# Patient Record
Sex: Female | Born: 1940 | Race: White | Hispanic: No | Marital: Single | Smoking: Former smoker
Health system: Southern US, Community
[De-identification: ages and names within clinical notes are randomized; demographics above are authoritative.]

## PROBLEM LIST (undated history)

## (undated) DIAGNOSIS — E559 Vitamin D deficiency, unspecified: Secondary | ICD-10-CM

## (undated) DIAGNOSIS — D0510 Intraductal carcinoma in situ of unspecified breast: Secondary | ICD-10-CM

## (undated) DIAGNOSIS — Z853 Personal history of malignant neoplasm of breast: Secondary | ICD-10-CM

## (undated) DIAGNOSIS — Z8543 Personal history of malignant neoplasm of ovary: Secondary | ICD-10-CM

## (undated) DIAGNOSIS — C569 Malignant neoplasm of unspecified ovary: Secondary | ICD-10-CM

## (undated) HISTORY — PX: BREAST SURGERY: SHX581

## (undated) HISTORY — PX: BIOPSY THYROID: PRO38

## (undated) HISTORY — DX: Personal history of malignant neoplasm of ovary: Z85.43

## (undated) HISTORY — DX: Vitamin D deficiency, unspecified: E55.9

## (undated) HISTORY — PX: CHOLECYSTECTOMY: SHX55

## (undated) HISTORY — PX: OTHER SURGICAL HISTORY: SHX169

## (undated) HISTORY — DX: Personal history of malignant neoplasm of breast: Z85.3

## (undated) HISTORY — DX: Intraductal carcinoma in situ of unspecified breast: D05.10

## (undated) HISTORY — DX: Malignant neoplasm of unspecified ovary: C56.9

## (undated) HISTORY — PX: COLON SURGERY: SHX602

---

## 1990-05-25 HISTORY — PX: MASTECTOMY: SHX3

## 1996-05-25 HISTORY — PX: TOTAL ABDOMINAL HYSTERECTOMY W/ BILATERAL SALPINGOOPHORECTOMY: SHX83

## 1998-01-01 ENCOUNTER — Other Ambulatory Visit: Admission: RE | Admit: 1998-01-01 | Discharge: 1998-01-01 | Payer: Self-pay | Admitting: Gynecology

## 1998-06-25 ENCOUNTER — Ambulatory Visit: Admission: RE | Admit: 1998-06-25 | Discharge: 1998-06-25 | Payer: Self-pay | Admitting: Gynecology

## 1999-03-25 ENCOUNTER — Ambulatory Visit: Admission: RE | Admit: 1999-03-25 | Discharge: 1999-03-25 | Payer: Self-pay | Admitting: Gynecology

## 1999-05-01 ENCOUNTER — Other Ambulatory Visit: Admission: RE | Admit: 1999-05-01 | Discharge: 1999-05-01 | Payer: Self-pay | Admitting: Gynecology

## 1999-08-26 ENCOUNTER — Ambulatory Visit: Admission: RE | Admit: 1999-08-26 | Discharge: 1999-08-26 | Payer: Self-pay | Admitting: Gynecology

## 2000-03-01 ENCOUNTER — Other Ambulatory Visit: Admission: RE | Admit: 2000-03-01 | Discharge: 2000-03-01 | Payer: Self-pay | Admitting: Gynecology

## 2000-07-20 ENCOUNTER — Ambulatory Visit: Admission: RE | Admit: 2000-07-20 | Discharge: 2000-07-20 | Payer: Self-pay | Admitting: Gynecology

## 2000-12-14 ENCOUNTER — Other Ambulatory Visit: Admission: RE | Admit: 2000-12-14 | Discharge: 2000-12-14 | Payer: Self-pay | Admitting: Gynecology

## 2001-05-11 ENCOUNTER — Ambulatory Visit: Admission: RE | Admit: 2001-05-11 | Discharge: 2001-05-11 | Payer: Self-pay | Admitting: Gynecology

## 2001-06-20 ENCOUNTER — Encounter (HOSPITAL_COMMUNITY): Payer: Self-pay | Admitting: Oncology

## 2001-06-20 ENCOUNTER — Ambulatory Visit (HOSPITAL_COMMUNITY): Admission: RE | Admit: 2001-06-20 | Discharge: 2001-06-20 | Payer: Self-pay | Admitting: Oncology

## 2002-01-17 ENCOUNTER — Ambulatory Visit: Admission: RE | Admit: 2002-01-17 | Discharge: 2002-01-17 | Payer: Self-pay | Admitting: Gynecology

## 2002-05-16 ENCOUNTER — Other Ambulatory Visit: Admission: RE | Admit: 2002-05-16 | Discharge: 2002-05-16 | Payer: Self-pay | Admitting: Gynecology

## 2002-05-25 HISTORY — PX: OTHER SURGICAL HISTORY: SHX169

## 2003-04-26 ENCOUNTER — Other Ambulatory Visit: Admission: RE | Admit: 2003-04-26 | Discharge: 2003-04-26 | Payer: Self-pay | Admitting: Gynecology

## 2003-04-27 ENCOUNTER — Other Ambulatory Visit: Admission: RE | Admit: 2003-04-27 | Discharge: 2003-04-27 | Payer: Self-pay | Admitting: Gynecology

## 2003-06-29 ENCOUNTER — Ambulatory Visit (HOSPITAL_COMMUNITY): Admission: RE | Admit: 2003-06-29 | Discharge: 2003-06-29 | Payer: Self-pay | Admitting: Oncology

## 2003-10-17 ENCOUNTER — Encounter: Admission: RE | Admit: 2003-10-17 | Discharge: 2003-10-17 | Payer: Self-pay | Admitting: General Surgery

## 2003-10-17 ENCOUNTER — Encounter (HOSPITAL_COMMUNITY): Admission: RE | Admit: 2003-10-17 | Discharge: 2004-01-15 | Payer: Self-pay | Admitting: General Surgery

## 2003-11-19 ENCOUNTER — Other Ambulatory Visit: Admission: RE | Admit: 2003-11-19 | Discharge: 2003-11-19 | Payer: Self-pay | Admitting: Gynecology

## 2003-12-04 ENCOUNTER — Other Ambulatory Visit: Admission: RE | Admit: 2003-12-04 | Discharge: 2003-12-04 | Payer: Self-pay | Admitting: Diagnostic Radiology

## 2004-04-11 ENCOUNTER — Ambulatory Visit: Payer: Self-pay | Admitting: Oncology

## 2004-05-08 ENCOUNTER — Other Ambulatory Visit: Admission: RE | Admit: 2004-05-08 | Discharge: 2004-05-08 | Payer: Self-pay | Admitting: Gynecology

## 2004-06-23 ENCOUNTER — Ambulatory Visit: Payer: Self-pay | Admitting: Oncology

## 2004-08-20 ENCOUNTER — Ambulatory Visit: Payer: Self-pay | Admitting: Oncology

## 2004-10-13 ENCOUNTER — Ambulatory Visit: Payer: Self-pay | Admitting: Oncology

## 2004-11-14 ENCOUNTER — Ambulatory Visit: Payer: Self-pay | Admitting: Internal Medicine

## 2004-11-20 ENCOUNTER — Ambulatory Visit: Payer: Self-pay | Admitting: Internal Medicine

## 2004-12-18 ENCOUNTER — Ambulatory Visit (HOSPITAL_BASED_OUTPATIENT_CLINIC_OR_DEPARTMENT_OTHER): Admission: RE | Admit: 2004-12-18 | Discharge: 2004-12-18 | Payer: Self-pay | Admitting: General Surgery

## 2005-04-12 ENCOUNTER — Ambulatory Visit: Payer: Self-pay | Admitting: Oncology

## 2005-08-10 ENCOUNTER — Other Ambulatory Visit: Admission: RE | Admit: 2005-08-10 | Discharge: 2005-08-10 | Payer: Self-pay | Admitting: Gynecology

## 2005-10-09 ENCOUNTER — Ambulatory Visit: Payer: Self-pay | Admitting: Oncology

## 2005-10-13 LAB — COMPREHENSIVE METABOLIC PANEL
ALT: 22 U/L (ref 0–40)
AST: 21 U/L (ref 0–37)
CO2: 27 mEq/L (ref 19–32)
Calcium: 9.5 mg/dL (ref 8.4–10.5)
Chloride: 102 mEq/L (ref 96–112)
Creatinine, Ser: 0.8 mg/dL (ref 0.4–1.2)
Potassium: 3.9 mEq/L (ref 3.5–5.3)
Sodium: 139 mEq/L (ref 135–145)
Total Protein: 6.9 g/dL (ref 6.0–8.3)

## 2005-10-13 LAB — CBC WITH DIFFERENTIAL/PLATELET
BASO%: 0.5 % (ref 0.0–2.0)
Eosinophils Absolute: 0.3 10*3/uL (ref 0.0–0.5)
MCHC: 34.2 g/dL (ref 32.0–36.0)
MONO#: 0.4 10*3/uL (ref 0.1–0.9)
NEUT#: 4 10*3/uL (ref 1.5–6.5)
Platelets: 263 10*3/uL (ref 145–400)
RBC: 4.74 10*6/uL (ref 3.70–5.32)
RDW: 12.2 % (ref 11.3–14.5)
WBC: 5.9 10*3/uL (ref 3.9–10.0)
lymph#: 1.2 10*3/uL (ref 0.9–3.3)

## 2005-10-13 LAB — LACTATE DEHYDROGENASE: LDH: 119 U/L (ref 94–250)

## 2005-10-13 LAB — CANCER ANTIGEN 27.29: CA 27.29: 25 U/mL (ref 0–39)

## 2006-04-09 ENCOUNTER — Ambulatory Visit: Payer: Self-pay | Admitting: Oncology

## 2006-04-13 LAB — COMPREHENSIVE METABOLIC PANEL
AST: 15 U/L (ref 0–37)
Albumin: 3.9 g/dL (ref 3.5–5.2)
Alkaline Phosphatase: 97 U/L (ref 39–117)
BUN: 12 mg/dL (ref 6–23)
Calcium: 9.2 mg/dL (ref 8.4–10.5)
Chloride: 101 mEq/L (ref 96–112)
Glucose, Bld: 112 mg/dL — ABNORMAL HIGH (ref 70–99)
Potassium: 4 mEq/L (ref 3.5–5.3)
Sodium: 138 mEq/L (ref 135–145)
Total Protein: 7.1 g/dL (ref 6.0–8.3)

## 2006-04-13 LAB — LACTATE DEHYDROGENASE: LDH: 129 U/L (ref 94–250)

## 2006-04-13 LAB — CBC WITH DIFFERENTIAL/PLATELET
Basophils Absolute: 0 10*3/uL (ref 0.0–0.1)
EOS%: 2.8 % (ref 0.0–7.0)
Eosinophils Absolute: 0.2 10*3/uL (ref 0.0–0.5)
HGB: 14.1 g/dL (ref 11.6–15.9)
MCV: 92.4 fL (ref 81.0–101.0)
MONO%: 6 % (ref 0.0–13.0)
NEUT#: 6.2 10*3/uL (ref 1.5–6.5)
RBC: 4.47 10*6/uL (ref 3.70–5.32)
RDW: 12.7 % (ref 11.3–14.5)
lymph#: 1.2 10*3/uL (ref 0.9–3.3)

## 2006-04-13 LAB — CANCER ANTIGEN 27.29: CA 27.29: 23 U/mL (ref 0–39)

## 2006-06-24 ENCOUNTER — Ambulatory Visit: Payer: Self-pay | Admitting: Internal Medicine

## 2006-07-09 ENCOUNTER — Ambulatory Visit: Payer: Self-pay | Admitting: Oncology

## 2006-07-14 ENCOUNTER — Ambulatory Visit (HOSPITAL_COMMUNITY): Admission: RE | Admit: 2006-07-14 | Discharge: 2006-07-14 | Payer: Self-pay | Admitting: Oncology

## 2006-07-21 ENCOUNTER — Encounter: Admission: RE | Admit: 2006-07-21 | Discharge: 2006-07-21 | Payer: Self-pay | Admitting: Oncology

## 2006-07-30 ENCOUNTER — Encounter (INDEPENDENT_AMBULATORY_CARE_PROVIDER_SITE_OTHER): Payer: Self-pay | Admitting: Specialist

## 2006-07-30 ENCOUNTER — Inpatient Hospital Stay (HOSPITAL_COMMUNITY): Admission: RE | Admit: 2006-07-30 | Discharge: 2006-08-05 | Payer: Self-pay | Admitting: Surgery

## 2006-08-02 ENCOUNTER — Ambulatory Visit: Payer: Self-pay | Admitting: Oncology

## 2006-08-30 ENCOUNTER — Ambulatory Visit: Payer: Self-pay | Admitting: Oncology

## 2006-09-02 LAB — COMPREHENSIVE METABOLIC PANEL
ALT: 18 U/L (ref 0–35)
AST: 16 U/L (ref 0–37)
Albumin: 3.9 g/dL (ref 3.5–5.2)
Alkaline Phosphatase: 78 U/L (ref 39–117)
BUN: 18 mg/dL (ref 6–23)
CO2: 23 mEq/L (ref 19–32)
Calcium: 8.8 mg/dL (ref 8.4–10.5)
Chloride: 104 mEq/L (ref 96–112)
Creatinine, Ser: 0.9 mg/dL (ref 0.40–1.20)
Glucose, Bld: 87 mg/dL (ref 70–99)
Potassium: 3.8 mEq/L (ref 3.5–5.3)
Sodium: 137 mEq/L (ref 135–145)
Total Bilirubin: 0.4 mg/dL (ref 0.3–1.2)
Total Protein: 6.6 g/dL (ref 6.0–8.3)

## 2006-09-02 LAB — CEA: CEA: <0.5 ng/mL (ref 0.0–5.0)

## 2006-09-02 LAB — CA 125: CA 125: 9.1 U/mL (ref 0.0–30.2)

## 2006-09-02 LAB — CBC WITH DIFFERENTIAL/PLATELET
BASO%: 0.9 % (ref 0.0–2.0)
EOS%: 1.9 % (ref 0.0–7.0)
MCH: 28.9 pg (ref 26.0–34.0)
MCV: 86.4 fL (ref 81.0–101.0)
MONO%: 10.4 % (ref 0.0–13.0)
RBC: 4.52 10*6/uL (ref 3.70–5.32)
RDW: 11.6 % (ref 11.3–14.5)
lymph#: 1.3 10*3/uL (ref 0.9–3.3)

## 2006-09-09 LAB — CBC WITH DIFFERENTIAL/PLATELET
Basophils Absolute: 0 10*3/uL (ref 0.0–0.1)
EOS%: 0.3 % (ref 0.0–7.0)
HGB: 13.1 g/dL (ref 11.6–15.9)
MCH: 29.5 pg (ref 26.0–34.0)
MCV: 86.3 fL (ref 81.0–101.0)
MONO%: 1.6 % (ref 0.0–13.0)
RDW: 12 % (ref 11.3–14.5)

## 2006-09-16 LAB — CBC WITH DIFFERENTIAL/PLATELET
BASO%: 1.8 % (ref 0.0–2.0)
EOS%: 1.7 % (ref 0.0–7.0)
Eosinophils Absolute: 0.2 10*3/uL (ref 0.0–0.5)
MCH: 29.4 pg (ref 26.0–34.0)
MCHC: 34.3 g/dL (ref 32.0–36.0)
MCV: 85.8 fL (ref 81.0–101.0)
MONO%: 12.9 % (ref 0.0–13.0)
NEUT#: 7.8 10*3/uL — ABNORMAL HIGH (ref 1.5–6.5)
RBC: 4.33 10*6/uL (ref 3.70–5.32)
RDW: 11.6 % (ref 11.3–14.5)

## 2006-09-23 LAB — CBC WITH DIFFERENTIAL/PLATELET
BASO%: 0.7 % (ref 0.0–2.0)
Eosinophils Absolute: 0 10*3/uL (ref 0.0–0.5)
MONO#: 0.7 10*3/uL (ref 0.1–0.9)
NEUT#: 7.1 10*3/uL — ABNORMAL HIGH (ref 1.5–6.5)
RBC: 4.42 10*6/uL (ref 3.70–5.32)
RDW: 11.8 % (ref 11.3–14.5)
WBC: 9.6 10*3/uL (ref 3.9–10.0)
lymph#: 1.7 10*3/uL (ref 0.9–3.3)

## 2006-09-30 LAB — CBC WITH DIFFERENTIAL/PLATELET
Eosinophils Absolute: 0 10*3/uL (ref 0.0–0.5)
MCV: 85.6 fL (ref 81.0–101.0)
MONO#: 0.1 10*3/uL (ref 0.1–0.9)
MONO%: 1.7 % (ref 0.0–13.0)
NEUT#: 6.5 10*3/uL (ref 1.5–6.5)
RBC: 4.46 10*6/uL (ref 3.70–5.32)
RDW: 13.1 % (ref 11.3–14.5)
WBC: 7.3 10*3/uL (ref 3.9–10.0)
lymph#: 0.6 10*3/uL — ABNORMAL LOW (ref 0.9–3.3)

## 2006-09-30 LAB — COMPREHENSIVE METABOLIC PANEL
ALT: 39 U/L — ABNORMAL HIGH (ref 0–35)
Albumin: 4.3 g/dL (ref 3.5–5.2)
CO2: 20 mEq/L (ref 19–32)
Glucose, Bld: 139 mg/dL — ABNORMAL HIGH (ref 70–99)
Potassium: 3.8 mEq/L (ref 3.5–5.3)
Sodium: 137 mEq/L (ref 135–145)
Total Protein: 7.2 g/dL (ref 6.0–8.3)

## 2006-09-30 LAB — LACTATE DEHYDROGENASE: LDH: 154 U/L (ref 94–250)

## 2006-10-07 LAB — CBC WITH DIFFERENTIAL/PLATELET
BASO%: 0.9 % (ref 0.0–2.0)
Basophils Absolute: 0.1 10*3/uL (ref 0.0–0.1)
EOS%: 2 % (ref 0.0–7.0)
Eosinophils Absolute: 0.1 10*3/uL (ref 0.0–0.5)
HCT: 36.3 % (ref 34.8–46.6)
HGB: 12.2 g/dL (ref 11.6–15.9)
LYMPH%: 17 % (ref 14.0–48.0)
MCH: 29 pg (ref 26.0–34.0)
MCHC: 33.8 g/dL (ref 32.0–36.0)
MCV: 85.9 fL (ref 81.0–101.0)
MONO#: 0.6 10*3/uL (ref 0.1–0.9)
MONO%: 8.7 % (ref 0.0–13.0)
NEUT#: 4.8 10*3/uL (ref 1.5–6.5)
NEUT%: 71.4 % (ref 39.6–76.8)
Platelets: 153 10*3/uL (ref 145–400)
RBC: 4.23 10*6/uL (ref 3.70–5.32)
RDW: 13.3 % (ref 11.3–14.5)
WBC: 6.7 10*3/uL (ref 3.9–10.0)
lymph#: 1.1 10*3/uL (ref 0.9–3.3)

## 2006-10-14 LAB — CBC WITH DIFFERENTIAL/PLATELET
Basophils Absolute: 0 10*3/uL (ref 0.0–0.1)
Eosinophils Absolute: 0 10*3/uL (ref 0.0–0.5)
HCT: 38.2 % (ref 34.8–46.6)
LYMPH%: 21.9 % (ref 14.0–48.0)
MONO#: 0.5 10*3/uL (ref 0.1–0.9)
NEUT#: 3.6 10*3/uL (ref 1.5–6.5)
NEUT%: 66.6 % (ref 39.6–76.8)
Platelets: 174 10*3/uL (ref 145–400)
WBC: 5.4 10*3/uL (ref 3.9–10.0)

## 2006-10-15 ENCOUNTER — Ambulatory Visit: Payer: Self-pay | Admitting: Oncology

## 2006-10-20 LAB — CBC WITH DIFFERENTIAL/PLATELET
BASO%: 0.7 % (ref 0.0–2.0)
EOS%: 0.5 % (ref 0.0–7.0)
HCT: 36.5 % (ref 34.8–46.6)
LYMPH%: 18.6 % (ref 14.0–48.0)
MCH: 29.8 pg (ref 26.0–34.0)
MCHC: 34.4 g/dL (ref 32.0–36.0)
NEUT%: 70.5 % (ref 39.6–76.8)
Platelets: 176 10*3/uL (ref 145–400)

## 2006-10-21 LAB — COMPREHENSIVE METABOLIC PANEL
ALT: 24 U/L (ref 0–35)
AST: 16 U/L (ref 0–37)
BUN: 17 mg/dL (ref 6–23)
CO2: 22 mEq/L (ref 19–32)
Creatinine, Ser: 0.76 mg/dL (ref 0.40–1.20)
Total Bilirubin: 0.4 mg/dL (ref 0.3–1.2)

## 2006-10-21 LAB — LACTATE DEHYDROGENASE: LDH: 123 U/L (ref 94–250)

## 2006-10-28 LAB — CBC WITH DIFFERENTIAL/PLATELET
Basophils Absolute: 0.1 10*3/uL (ref 0.0–0.1)
Eosinophils Absolute: 0.1 10*3/uL (ref 0.0–0.5)
HGB: 12.4 g/dL (ref 11.6–15.9)
MCV: 87.4 fL (ref 81.0–101.0)
MONO#: 0.6 10*3/uL (ref 0.1–0.9)
NEUT#: 4.1 10*3/uL (ref 1.5–6.5)
RBC: 4.12 10*6/uL (ref 3.70–5.32)
RDW: 14.8 % — ABNORMAL HIGH (ref 11.3–14.5)
WBC: 5.9 10*3/uL (ref 3.9–10.0)
lymph#: 1.1 10*3/uL (ref 0.9–3.3)

## 2006-11-04 LAB — CBC WITH DIFFERENTIAL/PLATELET
Basophils Absolute: 0 10*3/uL (ref 0.0–0.1)
Eosinophils Absolute: 0 10*3/uL (ref 0.0–0.5)
HGB: 12.5 g/dL (ref 11.6–15.9)
LYMPH%: 19 % (ref 14.0–48.0)
MCV: 88.1 fL (ref 81.0–101.0)
MONO#: 0.6 10*3/uL (ref 0.1–0.9)
MONO%: 8.8 % (ref 0.0–13.0)
NEUT#: 5 10*3/uL (ref 1.5–6.5)
Platelets: 206 10*3/uL (ref 145–400)
RBC: 4.14 10*6/uL (ref 3.70–5.32)
WBC: 7 10*3/uL (ref 3.9–10.0)

## 2006-11-09 LAB — CBC WITH DIFFERENTIAL/PLATELET
Eosinophils Absolute: 0 10*3/uL (ref 0.0–0.5)
HCT: 37.5 % (ref 34.8–46.6)
LYMPH%: 27.4 % (ref 14.0–48.0)
MCHC: 34.5 g/dL (ref 32.0–36.0)
MCV: 88.2 fL (ref 81.0–101.0)
MONO#: 0.5 10*3/uL (ref 0.1–0.9)
MONO%: 12 % (ref 0.0–13.0)
NEUT#: 2.5 10*3/uL (ref 1.5–6.5)
NEUT%: 59.3 % (ref 39.6–76.8)
Platelets: 180 10*3/uL (ref 145–400)
RBC: 4.25 10*6/uL (ref 3.70–5.32)
WBC: 4.2 10*3/uL (ref 3.9–10.0)

## 2006-11-11 LAB — LACTATE DEHYDROGENASE: LDH: 129 U/L (ref 94–250)

## 2006-11-11 LAB — COMPREHENSIVE METABOLIC PANEL
Alkaline Phosphatase: 106 U/L (ref 39–117)
CO2: 22 mEq/L (ref 19–32)
Creatinine, Ser: 0.76 mg/dL (ref 0.40–1.20)
Glucose, Bld: 172 mg/dL — ABNORMAL HIGH (ref 70–99)
Total Bilirubin: 0.3 mg/dL (ref 0.3–1.2)

## 2006-11-18 LAB — CBC WITH DIFFERENTIAL/PLATELET
BASO%: 0.9 % (ref 0.0–2.0)
Basophils Absolute: 0.1 10*3/uL (ref 0.0–0.1)
Eosinophils Absolute: 0.1 10*3/uL (ref 0.0–0.5)
HCT: 36.3 % (ref 34.8–46.6)
HGB: 12.7 g/dL (ref 11.6–15.9)
LYMPH%: 16.5 % (ref 14.0–48.0)
MCHC: 35 g/dL (ref 32.0–36.0)
MONO#: 1 10*3/uL — ABNORMAL HIGH (ref 0.1–0.9)
NEUT#: 6.6 10*3/uL — ABNORMAL HIGH (ref 1.5–6.5)
NEUT%: 70.8 % (ref 39.6–76.8)
Platelets: 174 10*3/uL (ref 145–400)
WBC: 9.3 10*3/uL (ref 3.9–10.0)
lymph#: 1.5 10*3/uL (ref 0.9–3.3)

## 2006-11-25 LAB — CBC WITH DIFFERENTIAL/PLATELET
BASO%: 0.6 % (ref 0.0–2.0)
Basophils Absolute: 0 10*3/uL (ref 0.0–0.1)
EOS%: 0.6 % (ref 0.0–7.0)
HCT: 38.2 % (ref 34.8–46.6)
HGB: 13.5 g/dL (ref 11.6–15.9)
LYMPH%: 26.1 % (ref 14.0–48.0)
MCH: 31.7 pg (ref 26.0–34.0)
MCHC: 35.3 g/dL (ref 32.0–36.0)
MCV: 90 fL (ref 81.0–101.0)
NEUT%: 62.5 % (ref 39.6–76.8)
Platelets: 181 10*3/uL (ref 145–400)
lymph#: 1.3 10*3/uL (ref 0.9–3.3)

## 2006-11-29 ENCOUNTER — Ambulatory Visit: Payer: Self-pay | Admitting: Oncology

## 2006-12-01 LAB — CBC WITH DIFFERENTIAL/PLATELET
BASO%: 0.7 % (ref 0.0–2.0)
Basophils Absolute: 0 10*3/uL (ref 0.0–0.1)
EOS%: 0.6 % (ref 0.0–7.0)
HGB: 13.6 g/dL (ref 11.6–15.9)
MCH: 32.6 pg (ref 26.0–34.0)
MCHC: 35.8 g/dL (ref 32.0–36.0)
MCV: 91 fL (ref 81.0–101.0)
MONO%: 14.9 % — ABNORMAL HIGH (ref 0.0–13.0)
NEUT%: 54.3 % (ref 39.6–76.8)
RDW: 15.8 % — ABNORMAL HIGH (ref 11.3–14.5)

## 2006-12-06 LAB — COMPREHENSIVE METABOLIC PANEL
ALT: 25 U/L (ref 0–35)
AST: 16 U/L (ref 0–37)
Alkaline Phosphatase: 101 U/L (ref 39–117)
BUN: 16 mg/dL (ref 6–23)
Creatinine, Ser: 0.74 mg/dL (ref 0.40–1.20)

## 2006-12-06 LAB — VITAMIN D PNL(25-HYDRXY+1,25-DIHY)-BLD: Vit D, 1,25-Dihydroxy: 43 pg/mL (ref 6–62)

## 2006-12-09 LAB — CBC WITH DIFFERENTIAL/PLATELET
Basophils Absolute: 0.1 10*3/uL (ref 0.0–0.1)
Eosinophils Absolute: 0.1 10*3/uL (ref 0.0–0.5)
HGB: 12.8 g/dL (ref 11.6–15.9)
MCV: 93.2 fL (ref 81.0–101.0)
MONO#: 0.5 10*3/uL (ref 0.1–0.9)
MONO%: 8.3 % (ref 0.0–13.0)
NEUT#: 4.5 10*3/uL (ref 1.5–6.5)
Platelets: 114 10*3/uL — ABNORMAL LOW (ref 145–400)
RBC: 3.87 10*6/uL (ref 3.70–5.32)
RDW: 14.4 % (ref 11.3–14.5)
WBC: 6.3 10*3/uL (ref 3.9–10.0)

## 2006-12-16 LAB — CBC WITH DIFFERENTIAL/PLATELET
Basophils Absolute: 0 10*3/uL (ref 0.0–0.1)
Eosinophils Absolute: 0 10*3/uL (ref 0.0–0.5)
HCT: 37.5 % (ref 34.8–46.6)
HGB: 13 g/dL (ref 11.6–15.9)
LYMPH%: 23.3 % (ref 14.0–48.0)
MCV: 95.1 fL (ref 81.0–101.0)
MONO#: 0.5 10*3/uL (ref 0.1–0.9)
MONO%: 9.7 % (ref 0.0–13.0)
NEUT#: 3.3 10*3/uL (ref 1.5–6.5)
NEUT%: 65.6 % (ref 39.6–76.8)
Platelets: 167 10*3/uL (ref 145–400)
RBC: 3.95 10*6/uL (ref 3.70–5.32)
WBC: 5 10*3/uL (ref 3.9–10.0)

## 2006-12-23 ENCOUNTER — Encounter: Payer: Self-pay | Admitting: Internal Medicine

## 2006-12-30 LAB — CBC WITH DIFFERENTIAL/PLATELET
BASO%: 0.6 % (ref 0.0–2.0)
HCT: 35.7 % (ref 34.8–46.6)
LYMPH%: 15.9 % (ref 14.0–48.0)
MCH: 34.8 pg — ABNORMAL HIGH (ref 26.0–34.0)
MCHC: 36.4 g/dL — ABNORMAL HIGH (ref 32.0–36.0)
MCV: 95.6 fL (ref 81.0–101.0)
MONO#: 0.8 10*3/uL (ref 0.1–0.9)
MONO%: 9.5 % (ref 0.0–13.0)
NEUT%: 73.1 % (ref 39.6–76.8)
Platelets: 128 10*3/uL — ABNORMAL LOW (ref 145–400)
RBC: 3.73 10*6/uL (ref 3.70–5.32)
WBC: 8.5 10*3/uL (ref 3.9–10.0)

## 2007-01-06 LAB — CBC WITH DIFFERENTIAL/PLATELET
BASO%: 0.8 % (ref 0.0–2.0)
Basophils Absolute: 0 10*3/uL (ref 0.0–0.1)
Eosinophils Absolute: 0 10*3/uL (ref 0.0–0.5)
HCT: 36.6 % (ref 34.8–46.6)
HGB: 13.2 g/dL (ref 11.6–15.9)
LYMPH%: 27.6 % (ref 14.0–48.0)
MCHC: 36 g/dL (ref 32.0–36.0)
MONO#: 0.5 10*3/uL (ref 0.1–0.9)
NEUT%: 61.6 % (ref 39.6–76.8)
Platelets: 162 10*3/uL (ref 145–400)
WBC: 5 10*3/uL (ref 3.9–10.0)

## 2007-01-13 LAB — CBC WITH DIFFERENTIAL/PLATELET
BASO%: 0.8 % (ref 0.0–2.0)
Basophils Absolute: 0 10*3/uL (ref 0.0–0.1)
EOS%: 1.2 % (ref 0.0–7.0)
HCT: 37.5 % (ref 34.8–46.6)
HGB: 13.4 g/dL (ref 11.6–15.9)
LYMPH%: 24.9 % (ref 14.0–48.0)
MCH: 34.6 pg — ABNORMAL HIGH (ref 26.0–34.0)
MCHC: 35.6 g/dL (ref 32.0–36.0)
MCV: 97.4 fL (ref 81.0–101.0)
MONO%: 11.8 % (ref 0.0–13.0)
NEUT%: 61.3 % (ref 39.6–76.8)
Platelets: 154 10*3/uL (ref 145–400)

## 2007-01-18 ENCOUNTER — Ambulatory Visit: Payer: Self-pay | Admitting: Oncology

## 2007-01-20 ENCOUNTER — Encounter: Payer: Self-pay | Admitting: Internal Medicine

## 2007-01-20 ENCOUNTER — Encounter (INDEPENDENT_AMBULATORY_CARE_PROVIDER_SITE_OTHER): Payer: Self-pay | Admitting: *Deleted

## 2007-01-20 LAB — COMPREHENSIVE METABOLIC PANEL
ALT: 22 U/L (ref 0–35)
AST: 19 U/L (ref 0–37)
Alkaline Phosphatase: 79 U/L (ref 39–117)
BUN: 14 mg/dL (ref 6–23)
Calcium: 9.1 mg/dL (ref 8.4–10.5)
Chloride: 105 mEq/L (ref 96–112)
Creatinine, Ser: 0.75 mg/dL (ref 0.40–1.20)
Potassium: 3.9 mEq/L (ref 3.5–5.3)

## 2007-01-20 LAB — CBC WITH DIFFERENTIAL/PLATELET
BASO%: 1.3 % (ref 0.0–2.0)
Basophils Absolute: 0.1 10*3/uL (ref 0.0–0.1)
EOS%: 6 % (ref 0.0–7.0)
MCH: 34.9 pg — ABNORMAL HIGH (ref 26.0–34.0)
MCHC: 36.3 g/dL — ABNORMAL HIGH (ref 32.0–36.0)
MCV: 96.1 fL (ref 81.0–101.0)
MONO%: 9.5 % (ref 0.0–13.0)
NEUT%: 58 % (ref 39.6–76.8)
RDW: 11.7 % (ref 11.3–14.5)
lymph#: 1.1 10*3/uL (ref 0.9–3.3)

## 2007-01-25 ENCOUNTER — Ambulatory Visit (HOSPITAL_COMMUNITY): Admission: RE | Admit: 2007-01-25 | Discharge: 2007-01-25 | Payer: Self-pay | Admitting: Oncology

## 2007-03-02 ENCOUNTER — Ambulatory Visit: Payer: Self-pay | Admitting: Oncology

## 2007-03-04 LAB — COMPREHENSIVE METABOLIC PANEL
ALT: 16 U/L (ref 0–35)
CO2: 25 mEq/L (ref 19–32)
Calcium: 9.2 mg/dL (ref 8.4–10.5)
Chloride: 105 mEq/L (ref 96–112)
Sodium: 140 mEq/L (ref 135–145)
Total Protein: 6.9 g/dL (ref 6.0–8.3)

## 2007-03-04 LAB — CBC WITH DIFFERENTIAL/PLATELET
Basophils Absolute: 0 10*3/uL (ref 0.0–0.1)
EOS%: 7.2 % — ABNORMAL HIGH (ref 0.0–7.0)
Eosinophils Absolute: 0.3 10*3/uL (ref 0.0–0.5)
HGB: 14.6 g/dL (ref 11.6–15.9)
LYMPH%: 34 % (ref 14.0–48.0)
MCH: 35.1 pg — ABNORMAL HIGH (ref 26.0–34.0)
MCV: 97.1 fL (ref 81.0–101.0)
MONO%: 9.8 % (ref 0.0–13.0)
NEUT#: 2 10*3/uL (ref 1.5–6.5)
NEUT%: 48.1 % (ref 39.6–76.8)
Platelets: 172 10*3/uL (ref 145–400)

## 2007-03-04 LAB — LACTATE DEHYDROGENASE: LDH: 113 U/L (ref 94–250)

## 2007-04-15 ENCOUNTER — Ambulatory Visit: Payer: Self-pay | Admitting: Oncology

## 2007-04-19 ENCOUNTER — Encounter: Payer: Self-pay | Admitting: Internal Medicine

## 2007-04-19 LAB — COMPREHENSIVE METABOLIC PANEL
ALT: 18 U/L (ref 0–35)
CO2: 24 mEq/L (ref 19–32)
Calcium: 9.6 mg/dL (ref 8.4–10.5)
Chloride: 101 mEq/L (ref 96–112)
Potassium: 4.2 mEq/L (ref 3.5–5.3)
Sodium: 138 mEq/L (ref 135–145)
Total Protein: 6.9 g/dL (ref 6.0–8.3)

## 2007-04-19 LAB — CBC WITH DIFFERENTIAL/PLATELET
Eosinophils Absolute: 0.2 10*3/uL (ref 0.0–0.5)
LYMPH%: 28 % (ref 14.0–48.0)
MCHC: 36.4 g/dL — ABNORMAL HIGH (ref 32.0–36.0)
MCV: 92.2 fL (ref 81.0–101.0)
MONO%: 10.4 % (ref 0.0–13.0)
NEUT#: 2.5 10*3/uL (ref 1.5–6.5)
NEUT%: 56.5 % (ref 39.6–76.8)
Platelets: 174 10*3/uL (ref 145–400)
RBC: 4.4 10*6/uL (ref 3.70–5.32)

## 2007-04-19 LAB — LACTATE DEHYDROGENASE: LDH: 137 U/L (ref 94–250)

## 2007-06-16 ENCOUNTER — Ambulatory Visit: Payer: Self-pay | Admitting: Oncology

## 2007-06-20 ENCOUNTER — Encounter: Payer: Self-pay | Admitting: Internal Medicine

## 2007-06-20 LAB — CBC WITH DIFFERENTIAL/PLATELET
BASO%: 1.2 % (ref 0.0–2.0)
Eosinophils Absolute: 0.2 10*3/uL (ref 0.0–0.5)
HCT: 41.1 % (ref 34.8–46.6)
LYMPH%: 30.4 % (ref 14.0–48.0)
MCHC: 35.6 g/dL (ref 32.0–36.0)
MCV: 92.3 fL (ref 81.0–101.0)
MONO%: 10.3 % (ref 0.0–13.0)
NEUT%: 54.1 % (ref 39.6–76.8)
Platelets: 176 10*3/uL (ref 145–400)
RBC: 4.45 10*6/uL (ref 3.70–5.32)

## 2007-06-21 LAB — COMPREHENSIVE METABOLIC PANEL
Alkaline Phosphatase: 84 U/L (ref 39–117)
Creatinine, Ser: 0.86 mg/dL (ref 0.40–1.20)
Glucose, Bld: 88 mg/dL (ref 70–99)
Sodium: 139 mEq/L (ref 135–145)
Total Bilirubin: 0.4 mg/dL (ref 0.3–1.2)
Total Protein: 6.8 g/dL (ref 6.0–8.3)

## 2007-06-23 ENCOUNTER — Ambulatory Visit: Payer: Self-pay | Admitting: Internal Medicine

## 2007-06-23 DIAGNOSIS — R1084 Generalized abdominal pain: Secondary | ICD-10-CM

## 2007-06-23 DIAGNOSIS — R112 Nausea with vomiting, unspecified: Secondary | ICD-10-CM | POA: Insufficient documentation

## 2007-06-23 HISTORY — DX: Generalized abdominal pain: R10.84

## 2007-06-24 ENCOUNTER — Telehealth: Payer: Self-pay | Admitting: Internal Medicine

## 2007-06-27 ENCOUNTER — Ambulatory Visit: Payer: Self-pay | Admitting: Internal Medicine

## 2007-06-27 ENCOUNTER — Inpatient Hospital Stay (HOSPITAL_COMMUNITY): Admission: AD | Admit: 2007-06-27 | Discharge: 2007-06-29 | Payer: Self-pay | Admitting: Internal Medicine

## 2007-06-27 LAB — CONVERTED CEMR LAB: Creatinine, Ser: 1 mg/dL (ref 0.4–1.2)

## 2007-07-01 ENCOUNTER — Ambulatory Visit: Payer: Self-pay | Admitting: Internal Medicine

## 2007-08-15 ENCOUNTER — Ambulatory Visit (HOSPITAL_COMMUNITY): Admission: RE | Admit: 2007-08-15 | Discharge: 2007-08-15 | Payer: Self-pay | Admitting: Oncology

## 2007-08-16 ENCOUNTER — Ambulatory Visit: Payer: Self-pay | Admitting: Oncology

## 2007-08-18 ENCOUNTER — Encounter: Payer: Self-pay | Admitting: Internal Medicine

## 2007-10-12 ENCOUNTER — Ambulatory Visit: Payer: Self-pay | Admitting: Oncology

## 2007-10-18 ENCOUNTER — Encounter: Payer: Self-pay | Admitting: Internal Medicine

## 2007-12-15 ENCOUNTER — Ambulatory Visit: Payer: Self-pay | Admitting: Oncology

## 2007-12-20 ENCOUNTER — Encounter: Payer: Self-pay | Admitting: Internal Medicine

## 2007-12-20 LAB — CBC WITH DIFFERENTIAL/PLATELET
Basophils Absolute: 0.1 10*3/uL (ref 0.0–0.1)
Eosinophils Absolute: 0.2 10*3/uL (ref 0.0–0.5)
LYMPH%: 27.8 % (ref 14.0–48.0)
MCH: 32.4 pg (ref 26.0–34.0)
MCV: 93.5 fL (ref 81.0–101.0)
MONO#: 0.4 10*3/uL (ref 0.1–0.9)
NEUT%: 56.6 % (ref 39.6–76.8)
RBC: 4.4 10*6/uL (ref 3.70–5.32)
lymph#: 1.2 10*3/uL (ref 0.9–3.3)

## 2007-12-20 LAB — CANCER ANTIGEN 27.29: CA 27.29: 31 U/mL (ref 0–39)

## 2007-12-20 LAB — COMPREHENSIVE METABOLIC PANEL
BUN: 15 mg/dL (ref 6–23)
CO2: 24 mEq/L (ref 19–32)
Calcium: 8.9 mg/dL (ref 8.4–10.5)
Chloride: 106 mEq/L (ref 96–112)
Creatinine, Ser: 0.86 mg/dL (ref 0.40–1.20)
Glucose, Bld: 91 mg/dL (ref 70–99)
Total Bilirubin: 0.3 mg/dL (ref 0.3–1.2)

## 2007-12-20 LAB — LACTATE DEHYDROGENASE: LDH: 121 U/L (ref 94–250)

## 2007-12-22 ENCOUNTER — Ambulatory Visit (HOSPITAL_COMMUNITY): Admission: RE | Admit: 2007-12-22 | Discharge: 2007-12-22 | Payer: Self-pay | Admitting: Oncology

## 2008-02-17 ENCOUNTER — Ambulatory Visit: Payer: Self-pay | Admitting: Oncology

## 2008-02-21 ENCOUNTER — Encounter: Payer: Self-pay | Admitting: Internal Medicine

## 2008-02-21 LAB — LACTATE DEHYDROGENASE: LDH: 131 U/L (ref 94–250)

## 2008-02-21 LAB — COMPREHENSIVE METABOLIC PANEL
ALT: 19 U/L (ref 0–35)
Albumin: 3.9 g/dL (ref 3.5–5.2)
CO2: 22 mEq/L (ref 19–32)
Calcium: 9.2 mg/dL (ref 8.4–10.5)
Chloride: 105 mEq/L (ref 96–112)
Sodium: 141 mEq/L (ref 135–145)
Total Protein: 6.5 g/dL (ref 6.0–8.3)

## 2008-02-21 LAB — CBC WITH DIFFERENTIAL/PLATELET
BASO%: 0.9 % (ref 0.0–2.0)
Eosinophils Absolute: 0.2 10*3/uL (ref 0.0–0.5)
HCT: 41.7 % (ref 34.8–46.6)
MCHC: 34.9 g/dL (ref 32.0–36.0)
MONO#: 0.6 10*3/uL (ref 0.1–0.9)
NEUT#: 3.6 10*3/uL (ref 1.5–6.5)
RBC: 4.5 10*6/uL (ref 3.70–5.32)
WBC: 5.6 10*3/uL (ref 3.9–10.0)
lymph#: 1.3 10*3/uL (ref 0.9–3.3)

## 2008-02-29 ENCOUNTER — Ambulatory Visit: Payer: Self-pay | Admitting: Family Medicine

## 2008-02-29 DIAGNOSIS — N390 Urinary tract infection, site not specified: Secondary | ICD-10-CM | POA: Insufficient documentation

## 2008-02-29 DIAGNOSIS — Z853 Personal history of malignant neoplasm of breast: Secondary | ICD-10-CM

## 2008-02-29 DIAGNOSIS — Z8543 Personal history of malignant neoplasm of ovary: Secondary | ICD-10-CM

## 2008-02-29 LAB — CONVERTED CEMR LAB
Bilirubin Urine: NEGATIVE
Blood in Urine, dipstick: NEGATIVE
Glucose, Urine, Semiquant: NEGATIVE
Ketones, urine, test strip: NEGATIVE
Nitrite: NEGATIVE
Urobilinogen, UA: 0.2
pH: 5.5

## 2008-03-01 ENCOUNTER — Encounter: Payer: Self-pay | Admitting: Family Medicine

## 2008-03-02 ENCOUNTER — Telehealth: Payer: Self-pay | Admitting: Internal Medicine

## 2008-03-23 ENCOUNTER — Ambulatory Visit: Payer: Self-pay | Admitting: Internal Medicine

## 2008-03-23 DIAGNOSIS — R079 Chest pain, unspecified: Secondary | ICD-10-CM

## 2008-03-23 LAB — CONVERTED CEMR LAB
Blood in Urine, dipstick: NEGATIVE
Ketones, urine, test strip: NEGATIVE
Urobilinogen, UA: 0.2
pH: 5.5

## 2008-03-28 ENCOUNTER — Ambulatory Visit: Payer: Self-pay | Admitting: Internal Medicine

## 2008-04-02 ENCOUNTER — Encounter: Payer: Self-pay | Admitting: Internal Medicine

## 2008-04-02 ENCOUNTER — Ambulatory Visit: Payer: Self-pay

## 2008-04-09 ENCOUNTER — Telehealth: Payer: Self-pay | Admitting: Internal Medicine

## 2008-04-10 ENCOUNTER — Ambulatory Visit (HOSPITAL_COMMUNITY): Admission: RE | Admit: 2008-04-10 | Discharge: 2008-04-10 | Payer: Self-pay | Admitting: Oncology

## 2008-04-10 ENCOUNTER — Ambulatory Visit: Payer: Self-pay | Admitting: Oncology

## 2008-04-12 ENCOUNTER — Encounter: Payer: Self-pay | Admitting: Internal Medicine

## 2008-04-12 LAB — CANCER ANTIGEN 27.29: CA 27.29: 26 U/mL (ref 0–39)

## 2008-04-12 LAB — COMPREHENSIVE METABOLIC PANEL
ALT: 14 U/L (ref 0–35)
Albumin: 3.9 g/dL (ref 3.5–5.2)
CO2: 25 mEq/L (ref 19–32)
Calcium: 9 mg/dL (ref 8.4–10.5)
Chloride: 105 mEq/L (ref 96–112)
Creatinine, Ser: 0.7 mg/dL (ref 0.40–1.20)
Sodium: 138 mEq/L (ref 135–145)
Total Protein: 6.5 g/dL (ref 6.0–8.3)

## 2008-04-12 LAB — CBC WITH DIFFERENTIAL/PLATELET
BASO%: 0.9 % (ref 0.0–2.0)
HCT: 43.3 % (ref 34.8–46.6)
MCHC: 34.4 g/dL (ref 32.0–36.0)
MONO#: 0.5 10*3/uL (ref 0.1–0.9)
NEUT%: 52.8 % (ref 39.6–76.8)
WBC: 4.5 10*3/uL (ref 3.9–10.0)
lymph#: 1.4 10*3/uL (ref 0.9–3.3)

## 2008-04-12 LAB — LACTATE DEHYDROGENASE: LDH: 128 U/L (ref 94–250)

## 2008-06-08 ENCOUNTER — Ambulatory Visit: Payer: Self-pay | Admitting: Oncology

## 2008-08-07 ENCOUNTER — Ambulatory Visit: Payer: Self-pay | Admitting: Oncology

## 2008-08-09 ENCOUNTER — Encounter: Payer: Self-pay | Admitting: Internal Medicine

## 2008-08-09 LAB — CBC WITH DIFFERENTIAL/PLATELET
BASO%: 0.3 % (ref 0.0–2.0)
Basophils Absolute: 0 10*3/uL (ref 0.0–0.1)
EOS%: 5 % (ref 0.0–7.0)
HCT: 44 % (ref 34.8–46.6)
HGB: 15.3 g/dL (ref 11.6–15.9)
LYMPH%: 26.7 % (ref 14.0–49.7)
MCH: 33 pg (ref 25.1–34.0)
MCHC: 34.7 g/dL (ref 31.5–36.0)
NEUT%: 59.5 % (ref 38.4–76.8)
Platelets: 174 10*3/uL (ref 145–400)

## 2008-08-09 LAB — COMPREHENSIVE METABOLIC PANEL
Albumin: 3.7 g/dL (ref 3.5–5.2)
Alkaline Phosphatase: 87 U/L (ref 39–117)
BUN: 10 mg/dL (ref 6–23)
Creatinine, Ser: 0.86 mg/dL (ref 0.40–1.20)
Glucose, Bld: 103 mg/dL — ABNORMAL HIGH (ref 70–99)
Potassium: 3.9 mEq/L (ref 3.5–5.3)
Total Bilirubin: 0.6 mg/dL (ref 0.3–1.2)

## 2008-08-09 LAB — CANCER ANTIGEN 27.29: CA 27.29: 25 U/mL (ref 0–39)

## 2008-08-19 ENCOUNTER — Emergency Department (HOSPITAL_BASED_OUTPATIENT_CLINIC_OR_DEPARTMENT_OTHER): Admission: EM | Admit: 2008-08-19 | Discharge: 2008-08-19 | Payer: Self-pay | Admitting: Emergency Medicine

## 2008-08-19 ENCOUNTER — Ambulatory Visit: Payer: Self-pay | Admitting: Diagnostic Radiology

## 2008-08-27 ENCOUNTER — Ambulatory Visit (HOSPITAL_BASED_OUTPATIENT_CLINIC_OR_DEPARTMENT_OTHER): Admission: RE | Admit: 2008-08-27 | Discharge: 2008-08-27 | Payer: Self-pay | Admitting: Orthopedic Surgery

## 2008-10-02 ENCOUNTER — Ambulatory Visit: Payer: Self-pay | Admitting: Oncology

## 2008-10-03 ENCOUNTER — Encounter: Admission: RE | Admit: 2008-10-03 | Discharge: 2008-11-08 | Payer: Self-pay | Admitting: Orthopedic Surgery

## 2008-11-23 ENCOUNTER — Encounter: Payer: Self-pay | Admitting: Internal Medicine

## 2008-11-28 ENCOUNTER — Ambulatory Visit: Payer: Self-pay | Admitting: Oncology

## 2008-11-30 ENCOUNTER — Encounter: Payer: Self-pay | Admitting: Internal Medicine

## 2008-11-30 LAB — CBC WITH DIFFERENTIAL/PLATELET
BASO%: 0.2 % (ref 0.0–2.0)
EOS%: 4 % (ref 0.0–7.0)
Eosinophils Absolute: 0.2 10*3/uL (ref 0.0–0.5)
MCHC: 34.9 g/dL (ref 31.5–36.0)
MCV: 94.7 fL (ref 79.5–101.0)
MONO%: 9.5 % (ref 0.0–14.0)
NEUT#: 3.1 10*3/uL (ref 1.5–6.5)
RBC: 4.31 10*6/uL (ref 3.70–5.45)
RDW: 12.8 % (ref 11.2–14.5)

## 2008-11-30 LAB — COMPREHENSIVE METABOLIC PANEL
ALT: 17 U/L (ref 0–35)
AST: 13 U/L (ref 0–37)
Albumin: 3.9 g/dL (ref 3.5–5.2)
Alkaline Phosphatase: 85 U/L (ref 39–117)
Glucose, Bld: 91 mg/dL (ref 70–99)
Potassium: 4 mEq/L (ref 3.5–5.3)
Sodium: 138 mEq/L (ref 135–145)
Total Protein: 6.5 g/dL (ref 6.0–8.3)

## 2009-01-29 ENCOUNTER — Ambulatory Visit: Payer: Self-pay | Admitting: Oncology

## 2009-03-26 ENCOUNTER — Ambulatory Visit (HOSPITAL_COMMUNITY): Admission: RE | Admit: 2009-03-26 | Discharge: 2009-03-26 | Payer: Self-pay | Admitting: Oncology

## 2009-03-29 ENCOUNTER — Ambulatory Visit: Payer: Self-pay | Admitting: Oncology

## 2009-04-02 ENCOUNTER — Encounter (INDEPENDENT_AMBULATORY_CARE_PROVIDER_SITE_OTHER): Payer: Self-pay | Admitting: *Deleted

## 2009-04-02 LAB — CBC WITH DIFFERENTIAL/PLATELET
Basophils Absolute: 0 10*3/uL (ref 0.0–0.1)
Eosinophils Absolute: 0.2 10*3/uL (ref 0.0–0.5)
HCT: 41.1 % (ref 34.8–46.6)
HGB: 14 g/dL (ref 11.6–15.9)
MCH: 32.6 pg (ref 25.1–34.0)
NEUT#: 3.3 10*3/uL (ref 1.5–6.5)
NEUT%: 63 % (ref 38.4–76.8)
RDW: 12.7 % (ref 11.2–14.5)
lymph#: 1.2 10*3/uL (ref 0.9–3.3)

## 2009-04-02 LAB — COMPREHENSIVE METABOLIC PANEL
Albumin: 4.1 g/dL (ref 3.5–5.2)
BUN: 14 mg/dL (ref 6–23)
CO2: 24 mEq/L (ref 19–32)
Calcium: 9.5 mg/dL (ref 8.4–10.5)
Chloride: 104 mEq/L (ref 96–112)
Creatinine, Ser: 0.76 mg/dL (ref 0.40–1.20)
Glucose, Bld: 65 mg/dL — ABNORMAL LOW (ref 70–99)
Potassium: 3.9 mEq/L (ref 3.5–5.3)

## 2009-04-02 LAB — LACTATE DEHYDROGENASE: LDH: 138 U/L (ref 94–250)

## 2009-04-02 LAB — CANCER ANTIGEN 27.29: CA 27.29: 21 U/mL (ref 0–39)

## 2009-05-23 ENCOUNTER — Ambulatory Visit: Payer: Self-pay | Admitting: Oncology

## 2009-06-06 ENCOUNTER — Ambulatory Visit: Payer: Self-pay | Admitting: Internal Medicine

## 2009-07-18 ENCOUNTER — Ambulatory Visit: Payer: Self-pay | Admitting: Oncology

## 2009-07-23 ENCOUNTER — Encounter: Payer: Self-pay | Admitting: Internal Medicine

## 2009-07-23 LAB — LACTATE DEHYDROGENASE: LDH: 103 U/L (ref 94–250)

## 2009-07-23 LAB — CBC WITH DIFFERENTIAL/PLATELET
Basophils Absolute: 0 10*3/uL (ref 0.0–0.1)
Eosinophils Absolute: 0.1 10*3/uL (ref 0.0–0.5)
HCT: 41.6 % (ref 34.8–46.6)
HGB: 14.3 g/dL (ref 11.6–15.9)
MCH: 32.8 pg (ref 25.1–34.0)
MONO#: 0.5 10*3/uL (ref 0.1–0.9)
NEUT#: 3.6 10*3/uL (ref 1.5–6.5)
NEUT%: 65.4 % (ref 38.4–76.8)
RDW: 12.6 % (ref 11.2–14.5)
lymph#: 1.2 10*3/uL (ref 0.9–3.3)

## 2009-07-23 LAB — COMPREHENSIVE METABOLIC PANEL
AST: 18 U/L (ref 0–37)
Albumin: 3.7 g/dL (ref 3.5–5.2)
BUN: 17 mg/dL (ref 6–23)
CO2: 25 mEq/L (ref 19–32)
Calcium: 8.7 mg/dL (ref 8.4–10.5)
Chloride: 103 mEq/L (ref 96–112)
Creatinine, Ser: 0.78 mg/dL (ref 0.40–1.20)
Glucose, Bld: 108 mg/dL — ABNORMAL HIGH (ref 70–99)
Potassium: 3.5 mEq/L (ref 3.5–5.3)

## 2009-07-23 LAB — CANCER ANTIGEN 27.29: CA 27.29: 17 U/mL (ref 0–39)

## 2009-09-16 ENCOUNTER — Ambulatory Visit: Payer: Self-pay | Admitting: Oncology

## 2009-11-14 ENCOUNTER — Ambulatory Visit: Payer: Self-pay | Admitting: Oncology

## 2009-11-18 ENCOUNTER — Encounter: Payer: Self-pay | Admitting: Internal Medicine

## 2009-11-18 LAB — CBC WITH DIFFERENTIAL/PLATELET
BASO%: 0.4 % (ref 0.0–2.0)
Basophils Absolute: 0 10*3/uL (ref 0.0–0.1)
EOS%: 3.1 % (ref 0.0–7.0)
HGB: 14.4 g/dL (ref 11.6–15.9)
MCH: 32 pg (ref 25.1–34.0)
MCHC: 33.7 g/dL (ref 31.5–36.0)
MCV: 95 fL (ref 79.5–101.0)
MONO%: 8.3 % (ref 0.0–14.0)
RBC: 4.5 10*6/uL (ref 3.70–5.45)
RDW: 12.6 % (ref 11.2–14.5)

## 2009-11-18 LAB — COMPREHENSIVE METABOLIC PANEL
AST: 19 U/L (ref 0–37)
Albumin: 3.7 g/dL (ref 3.5–5.2)
Alkaline Phosphatase: 88 U/L (ref 39–117)
BUN: 18 mg/dL (ref 6–23)
Potassium: 3.8 mEq/L (ref 3.5–5.3)

## 2009-12-17 ENCOUNTER — Encounter: Admission: RE | Admit: 2009-12-17 | Discharge: 2010-02-03 | Payer: Self-pay | Admitting: Orthopedic Surgery

## 2010-01-10 ENCOUNTER — Ambulatory Visit: Payer: Self-pay | Admitting: Oncology

## 2010-01-13 ENCOUNTER — Ambulatory Visit: Payer: Self-pay | Admitting: Internal Medicine

## 2010-01-13 LAB — CONVERTED CEMR LAB
Bilirubin, Direct: 0.1 mg/dL (ref 0.0–0.3)
Calcium: 9.6 mg/dL (ref 8.4–10.5)
Chloride: 103 meq/L (ref 96–112)
Cholesterol: 223 mg/dL — ABNORMAL HIGH (ref 0–200)
Creatinine, Ser: 0.8 mg/dL (ref 0.4–1.2)
Eosinophils Absolute: 0.2 10*3/uL (ref 0.0–0.7)
Glucose, Bld: 93 mg/dL (ref 70–99)
Glucose, Urine, Semiquant: NEGATIVE
HCT: 44 % (ref 36.0–46.0)
HDL: 49.4 mg/dL (ref >39.00)
Lymphocytes Relative: 26.8 % (ref 12.0–46.0)
Lymphs Abs: 1.6 10*3/uL (ref 0.7–4.0)
MCV: 96.1 fL (ref 78.0–100.0)
Monocytes Relative: 9.3 % (ref 3.0–12.0)
Platelets: 194 10*3/uL (ref 150.0–400.0)
RBC: 4.58 M/uL (ref 3.87–5.11)
RDW: 13.3 % (ref 11.5–14.6)
Sodium: 139 meq/L (ref 135–145)
TSH: 11.36 microintl units/mL — ABNORMAL HIGH (ref 0.35–5.50)
Total CHOL/HDL Ratio: 5
Triglycerides: 260 mg/dL — ABNORMAL HIGH (ref 0.0–149.0)
VLDL: 52 mg/dL — ABNORMAL HIGH (ref 0.0–40.0)
WBC: 5.8 10*3/uL (ref 4.5–10.5)

## 2010-01-20 ENCOUNTER — Ambulatory Visit: Payer: Self-pay | Admitting: Internal Medicine

## 2010-02-04 ENCOUNTER — Encounter: Admission: RE | Admit: 2010-02-04 | Discharge: 2010-02-04 | Payer: Self-pay | Admitting: Gynecology

## 2010-02-04 ENCOUNTER — Encounter: Payer: Self-pay | Admitting: Internal Medicine

## 2010-03-03 ENCOUNTER — Telehealth: Payer: Self-pay | Admitting: Internal Medicine

## 2010-03-05 ENCOUNTER — Encounter: Admission: RE | Admit: 2010-03-05 | Discharge: 2010-03-18 | Payer: Self-pay | Admitting: Gynecology

## 2010-03-17 ENCOUNTER — Ambulatory Visit: Payer: Self-pay | Admitting: Internal Medicine

## 2010-03-18 ENCOUNTER — Ambulatory Visit: Payer: Self-pay | Admitting: Oncology

## 2010-03-20 ENCOUNTER — Encounter: Payer: Self-pay | Admitting: Internal Medicine

## 2010-03-20 LAB — COMPREHENSIVE METABOLIC PANEL
Albumin: 3.8 g/dL (ref 3.5–5.2)
Alkaline Phosphatase: 77 U/L (ref 39–117)
BUN: 16 mg/dL (ref 6–23)
CO2: 25 mEq/L (ref 19–32)
Glucose, Bld: 79 mg/dL (ref 70–99)
Sodium: 138 mEq/L (ref 135–145)
Total Bilirubin: 0.3 mg/dL (ref 0.3–1.2)
Total Protein: 6.4 g/dL (ref 6.0–8.3)

## 2010-03-20 LAB — CBC WITH DIFFERENTIAL/PLATELET
Basophils Absolute: 0 10*3/uL (ref 0.0–0.1)
EOS%: 2.7 % (ref 0.0–7.0)
LYMPH%: 24 % (ref 14.0–49.7)
MCV: 94.7 fL (ref 79.5–101.0)
MONO#: 0.5 10*3/uL (ref 0.1–0.9)
MONO%: 10 % (ref 0.0–14.0)
Platelets: 200 10*3/uL (ref 145–400)
RDW: 12.5 % (ref 11.2–14.5)

## 2010-03-20 LAB — CANCER ANTIGEN 27.29: CA 27.29: 22 U/mL (ref 0–39)

## 2010-03-26 ENCOUNTER — Ambulatory Visit (HOSPITAL_COMMUNITY): Admission: RE | Admit: 2010-03-26 | Discharge: 2010-03-26 | Payer: Self-pay | Admitting: Oncology

## 2010-05-14 ENCOUNTER — Ambulatory Visit: Payer: Self-pay | Admitting: Oncology

## 2010-06-24 NOTE — Letter (Signed)
Summary: [facility]  [facility]   Imported By: Sherian Rein 04/08/2010 12:57:48  _____________________________________________________________________  External Attachment:    Type:   Image     Comment:   External Document

## 2010-06-24 NOTE — Progress Notes (Signed)
Summary: recheck tsh  Phone Note Call from Patient Call back at Home Phone [phone]   Caller: Patient Call For: Gordy Savers  MD Summary of Call: pt was put on thyroid med needs to know when to have it tsh recheck. Initial call taken by: Heron Sabins,  March 03, 2010 3:24 PM  Follow-up for Phone Call        6-8 weeks after new dose Follow-up by: Gordy Savers  MD,  March 03, 2010 4:00 PM  Additional Follow-up for Phone Call Additional follow up Details #1::        tsh lab ov sch for  03-17-2010 2pm Additional Follow-up by: Heron Sabins,  March 03, 2010 4:15 PM

## 2010-06-24 NOTE — Assessment & Plan Note (Signed)
Summary: cpx/cjr   Vital Signs:  Patient profile:   70 year old female Height:      67.5 inches Weight:      164 pounds BMI:     25.40 Temp:     97.7 degrees F oral BP sitting:   106 / 70  (right arm) Cuff size:   regular  Vitals Entered By: Duard Brady LPN (January 20, 2010 1:57 PM) CC: cpx - doing well Is Patient Diabetic? No   CC:  cpx - doing well.  History of Present Illness: 70 year old patient who is seen today for a wellness exam.  She is followed closely by oncology due to a history of prior breast and ovarian cancer.  She is doing quite well.  Today she is scheduled for a annual PET scan in approximately 3 months.  Here for Medicare AWV:  1.   Risk factors based on Past M, S, F history:  include a family history of cardiovascular disease, only 2.   Physical Activities: remains quite active participates at a local health club 3.   Depression/mood: no history of depression or mood disorder 4.   Hearing: no deficits 5.   ADL's: independent in all aspects of daily living 6.   Fall Risk: low 7.   Home Safety: no cause identified 8.   Height, weight, &visual acuity:height and weight are stable.  No change in visual acuity 9.   Counseling: regular exercise, heart healthy diet.  Encouraged 10.   Labs ordered based on risk factors: laboratory profile included lipid panel reviewed 11.           Referral Coordination- follow-up oncology, and GYN 12.           Care Plan- follow-up PET scan 3 months 13.            Cognitive Assessment- alert and oriented, with normal affect.  No memory dysfunction.  No problems with executive functioning, and handles all personal affairs   Preventive Screening-Counseling & Management  Alcohol-Tobacco     Smoking Status: never  Allergies: 1)  ! Codeine Sulfate (Codeine Sulfate) 2)  ! Promethazine Hcl (Promethazine Hcl)  Past History:  Past Medical History: Reviewed history from 06/06/2009 and no changes required. 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  recurrent ovarian carcinoma, February 2008  admit February 2009 partial small bowel obstruction treated medically  Past Surgical History: Thyroid biopsy for benign disease colonoscopy  2004; 2/08 left ankle fracture, status post surgery, March 2010 Cholecystectomy  Family History: Reviewed history from 03/23/2008 and no changes required. father died age 84, following gallbladder surgery; history of abdominal aortic aneurysm and cerebrovascular disease  Mother died of ovarian cancer, history of melanoma  Two brothers;  one brother, status post CABG (possible stenting only), complex diverticular disease  Social History: Reviewed history from 03/23/2008 and no changes required. Married Smoking Status:  never  Review of Systems  The patient denies anorexia, fever, weight loss, weight gain, vision loss, decreased hearing, hoarseness, chest pain, syncope, dyspnea on exertion, peripheral edema, prolonged cough, headaches, hemoptysis, abdominal pain, melena, hematochezia, severe indigestion/heartburn, hematuria,  incontinence, genital sores, muscle weakness, suspicious skin lesions, transient blindness, difficulty walking, depression, unusual weight change, abnormal bleeding, enlarged lymph nodes, angioedema, and breast masses.    Physical Exam  General:  Well-developed,well-nourished,in no acute distress; alert,appropriate and cooperative throughout examination; blood pressure  low normal Head:  Normocephalic and atraumatic without  obvious abnormalities. No apparent alopecia or balding. Eyes:  No corneal or conjunctival inflammation noted. EOMI. Perrla. Funduscopic exam benign, without hemorrhages, exudates or papilledema. Vision grossly normal. Ears:  External ear exam shows no significant lesions or deformities.  Otoscopic examination reveals clear canals, tympanic membranes are intact bilaterally without bulging, retraction, inflammation or discharge. Hearing is grossly normal bilaterally. Nose:  External nasal examination shows no deformity or inflammation. Nasal mucosa are pink and moist without lesions or exudates. Mouth:  Oral mucosa and oropharynx without lesions or exudates.  Teeth in good repair. Chest Wall:  No deformities, masses, or tenderness noted. Breasts:  status post bilateral implants Lungs:  Normal respiratory effort, chest expands symmetrically. Lungs are clear to auscultation, no crackles or wheezes. Port-A-Cath noted right upper anterior chest Heart:  Normal rate and regular rhythm. S1 and S2 normal without gallop, murmur, click, rub or other extra sounds. Abdomen:  multiple surgical scars noted.  No organomegaly Msk:  No deformity or scoliosis noted of thoracic or lumbar spine.   Pulses:  R and L carotid,radial,femoral,dorsalis pedis and posterior tibial pulses are full and equal bilaterally Extremities:  No clubbing, cyanosis, edema, or deformity noted with normal full range of motion of all joints.   Neurologic:  No cranial nerve deficits noted. Station and gait are normal. Plantar reflexes are down-going bilaterally. DTRs are symmetrical throughout. Sensory, motor and coordinative functions appear intact. Skin:  Intact without suspicious lesions or rashes Cervical Nodes:  No lymphadenopathy noted Axillary Nodes:  No palpable lymphadenopathy Inguinal Nodes:  No significant adenopathy Psych:  Cognition and judgment appear intact. Alert and cooperative with normal attention span and concentration. No  apparent delusions, illusions, hallucinations   Impression & Recommendations:  Problem # 1:  PHYSICAL EXAMINATION (ICD-V70.0)  Orders: EKG w/ Interpretation (93000) Medicare -1st Annual Wellness Visit [phone])  Complete Medication List: 1)  Melatonin 200 Mcg Tabs (Melatonin) .... Once daily 2)  Estrace 1 Mg Tabs (Estradiol) .Marland Kitchen.. 1 qd 3)  B Complex Tabs (B complex vitamins) .... Qd 4)  Fish Oil 1000 Mg Caps (Omega-3 fatty acids) .... Qd 5)  Caltrate 600+d 600-400 Mg-unit Tabs (Calcium carbonate-vitamin d) .... Qd 6)  Multivitamins Tabs (Multiple vitamin) .... Qd 7)  Co Q-10 30 Mg Caps (Coenzyme q10) .... Qd 8)  Borage Oil 500 Mg Caps (Borage (borago officinalis)) .... Qd  Patient Instructions: 1)  It is important that you exercise regularly at least 20 minutes 5 times a week. If you develop chest pain, have severe difficulty breathing, or feel very tired , stop exercising immediately and seek medical attention. 2)  Schedule your mammogram. 3)  Take calcium +Vitamin D daily.

## 2010-06-24 NOTE — Assessment & Plan Note (Signed)
Summary: ?strep throat/swollen glands/cjr   Vital Signs:  Patient profile:   70 year old female Weight:      164 pounds Temp:     98.6 degrees F oral BP sitting:   92 / 52  (right arm) Cuff size:   regular  Vitals Entered By: Raechel Ache, RN (June 06, 2009 2:32 PM) CC: C/o headcold x 4 weeks, c/o sore throat and ?swollen glands.   CC:  C/o headcold x 4 weeks and c/o sore throat and ?swollen glands..  History of Present Illness: 70 year old patient, who presents with a 4 week history of head and chest congestion. There has been no fever.  At the present time, she complains of some mild residual sore throat, but otherwise feels well.  She is followed closely by oncology.  Her cough and congestion has larger, resolved.  She has been taking NyQuil with some benefit  Allergies: 1)  ! Codeine Sulfate (Codeine Sulfate) 2)  ! 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  recurrent ovarian carcinoma, February 2008  admit February 2009 partial small bowel obstruction treated medically  Past Surgical History: Thyroid biopsy for benign disease colonoscopy  2004; 2/08 left ankle fracture, status post surgery, March 2010  Family History: Reviewed history from 03/23/2008 and no changes required. father died age 30, following gallbladder surgery; history of abdominal aortic aneurysm and cerebrovascular disease  Mother died of ovarian cancer, history of melanoma  Two brothers;  one  brother, status post CABG  Physical Exam  General:  Well-developed,well-nourished,in no acute distress; alert,appropriate and cooperative throughout examination Head:  Normocephalic and atraumatic without obvious abnormalities. No apparent alopecia or balding. Eyes:  No corneal or conjunctival inflammation noted. EOMI. Perrla. Funduscopic exam benign, without hemorrhages, exudates or papilledema. Vision grossly normal. Ears:  External ear exam shows no significant lesions or deformities.  Otoscopic examination reveals clear canals, tympanic membranes are intact bilaterally without bulging, retraction, inflammation or discharge. Hearing is grossly normal bilaterally. Nose:  External nasal examination shows no deformity or inflammation. Nasal mucosa are pink and moist without lesions or exudates. Mouth:  Oral mucosa and oropharynx without lesions or exudates.  Teeth in good repair. Neck:  No deformities, masses, or tenderness noted. Lungs:  Normal respiratory effort, chest expands symmetrically. Lungs are clear to auscultation, no crackles or wheezes. Heart:  Normal rate and regular rhythm. S1 and S2 normal without gallop, murmur, click, rub or other extra sounds. Abdomen:  Bowel sounds positive,abdomen soft and non-tender without masses, organomegaly or hernias noted.   Complete Medication List: 1)  Melatonin 200 Mcg Tabs (Melatonin) .... Once daily 2)  Estrace 1 Mg Tabs (Estradiol) .Marland Kitchen.. 1 qd  Patient Instructions: 1)  Please schedule a follow-up appointment in 4 months CPX 2)  It is important that you exercise regularly at least 20 minutes 5 times a week. If you develop chest pain, have severe difficulty breathing, or feel very tired , stop exercising immediately and seek medical attention.  Appended Document: ?strep throat/swollen glands/cjr Impression and recommendations:  1. URI- will treat  symptomatically 2. sore throat 3. history of ovarian and breast cancer  Follow-up  oncology annual examination in 3 to 4 months

## 2010-06-24 NOTE — Letter (Signed)
[facility]   Imported By: Maryln Gottron 08/15/2009 15:23:28  _____________________________________________________________________  External Attachment:    Type:   Image     Comment:   External Document

## 2010-06-24 NOTE — Letter (Signed)
[facility]   Imported By: Lennie Odor 12/06/2009 13:53:55  _____________________________________________________________________  External Attachment:    Type:   Image     Comment:   External Document

## 2010-07-07 ENCOUNTER — Encounter (HOSPITAL_BASED_OUTPATIENT_CLINIC_OR_DEPARTMENT_OTHER): Payer: Medicare Other | Admitting: Oncology

## 2010-07-07 ENCOUNTER — Other Ambulatory Visit (HOSPITAL_COMMUNITY): Payer: Self-pay | Admitting: Oncology

## 2010-07-07 DIAGNOSIS — C569 Malignant neoplasm of unspecified ovary: Secondary | ICD-10-CM

## 2010-07-07 LAB — CBC WITH DIFFERENTIAL/PLATELET
BASO%: 0.5 % (ref 0.0–2.0)
Eosinophils Absolute: 0.2 10*3/uL (ref 0.0–0.5)
HCT: 36 % (ref 34.8–46.6)
MCHC: 34.6 g/dL (ref 31.5–36.0)
MONO#: 0.5 10*3/uL (ref 0.1–0.9)
NEUT#: 3.2 10*3/uL (ref 1.5–6.5)
RBC: 3.83 10*6/uL (ref 3.70–5.45)
WBC: 5 10*3/uL (ref 3.9–10.3)
lymph#: 1 10*3/uL (ref 0.9–3.3)

## 2010-07-07 LAB — LACTATE DEHYDROGENASE: LDH: 107 U/L (ref 94–250)

## 2010-07-07 LAB — COMPREHENSIVE METABOLIC PANEL
ALT: 15 U/L (ref 0–35)
Albumin: 3.1 g/dL — ABNORMAL LOW (ref 3.5–5.2)
CO2: 20 mEq/L (ref 19–32)
Calcium: 6.8 mg/dL — ABNORMAL LOW (ref 8.4–10.5)
Chloride: 114 mEq/L — ABNORMAL HIGH (ref 96–112)
Glucose, Bld: 76 mg/dL (ref 70–99)
Potassium: 3.3 mEq/L — ABNORMAL LOW (ref 3.5–5.3)
Sodium: 142 mEq/L (ref 135–145)
Total Protein: 4.7 g/dL — ABNORMAL LOW (ref 6.0–8.3)

## 2010-07-25 ENCOUNTER — Other Ambulatory Visit: Payer: Self-pay

## 2010-07-25 MED ORDER — LEVOTHYROXINE SODIUM 88 MCG PO TABS: 88.0000 ug | ORAL_TABLET | Freq: Every day | ORAL | Status: DC

## 2010-07-25 NOTE — Telephone Encounter (Signed)
Fax back to right source

## 2010-08-05 LAB — GLUCOSE, CAPILLARY: Glucose-Capillary: 97 mg/dL (ref 70–99)

## 2010-08-27 LAB — GLUCOSE, CAPILLARY: Glucose-Capillary: 80 mg/dL (ref 70–99)

## 2010-09-05 ENCOUNTER — Encounter (HOSPITAL_BASED_OUTPATIENT_CLINIC_OR_DEPARTMENT_OTHER): Payer: Medicare Other | Admitting: Oncology

## 2010-09-05 DIAGNOSIS — C569 Malignant neoplasm of unspecified ovary: Secondary | ICD-10-CM

## 2010-09-05 DIAGNOSIS — Z452 Encounter for adjustment and management of vascular access device: Secondary | ICD-10-CM

## 2010-10-07 NOTE — H&P (Signed)
[name], [name]             ACCOUNT NO.:  [id number]   MEDICAL RECORD NO.:  [id number]          PATIENT TYPE:  INP   LOCATION:  1531                         FACILITY:  [facility]   PHYSICIAN:  Gordy Savers, MDDATE OF BIRTH:  March 15, 1941   DATE OF ADMISSION:  06/27/2007  DATE OF DISCHARGE:                              HISTORY & PHYSICAL   CHIEF COMPLAINT:  Abdominal pain and nausea.   HISTORY OF PRESENT ILLNESS:  The patient is a 70 year old female with  history recurrent metastatic ovarian carcinoma in February 2008.  At  that time, she underwent colonoscopy for diagnostic purposes and was  diagnosed and treated for recurrent metastatic ovarian carcinoma.  The  patient had a total body CAT scan in September 2008 and was seen by  Oncology approximately one week ago.  Late last week, the patient had  the onset of crampy abdominal pain associated with episodes of nausea  and vomiting.  She denied any fever.  She was seen in the office on  June 23, 2007, and treated symptomatically with proton pump  inhibitors and antiemetics.  Over the weekend, the patient continues to  have intermittent lower crampy abdominal pain and episodes of nausea and  vomiting.  She has had two scanty bowel movements over the last 24  hours.  On the day of admission, the patient had an outpatient CT  abdominal scan performed that was consistent with a small bowel  obstruction.  The patient is now admitted for further evaluation and  treatment.   PAST MEDICAL HISTORY:  In March 1999, the patient was diagnosed with  stage IIIC ovarian cancer and underwent suboptimal debulking at that  time.  She was treated with six cycles of Taxol and carboplatinum and a  complete remission documented by a second look surgery in September  1998.  In February 2008, the patient had recurrent ovarian carcinoma.  She has been treated by Dr. Arline Asp, Dr. Madilyn Fireman and Dr. Cicero Duck.  In 1992, the patient had a left  breast intraductal carcinoma diagnosed,  and she is status post left modified radical mastectomy with a silicon  implant, and 13 of 13 lymph nodes were negative at that time.  The  patient is positive for BCRA1 gene.   PAST SURGICAL HISTORY:  Remarkable for benign thyroid biopsy.  She has  had colonoscopies in 2004 and 2008.   FAMILY HISTORY:  The patient's father died at age 79 following  gallbladder surgery.  He had a history of abdominal aortic aneurysm and  cerebrovascular disease.  Mother died of ovarian cancer and also had a  history of melanoma.  Two brothers are well   SOCIAL HISTORY:  The patient is divorced.  Family in the area.  Nondrinker, nonsmoker.   PHYSICAL EXAMINATION:  GENERAL:  Reveals a well-developed, healthy-  appearing female in no acute distress.  HEENT:  Head and neck revealed no obvious abnormalities.  Pupil  responses were normal.  Conjunctiva clear.  The patient was anicteric.  Oropharynx benign.  NECK:  Revealed no masses, adenopathy or bruits.  CHEST: Clear to auscultation.  CARDIOVASCULAR:  Normal  S1-S2 no murmurs or gallops.  ABDOMEN:  Not distended.  The patient had only of minimal tenderness in  the mid and lower abdominal areas.  Bowel sounds were essentially  normal, perhaps a few high-pitched sounds.  No guarding or rebound  appreciated.  EXTREMITIES:  Revealed no edema.  Peripheral pulses were intact   IMPRESSION:  1. Partial small-bowel obstruction  2. History of metastatic ovarian carcinoma.  3. Remote history of left breast carcinoma.   DISPOSITION:  The patient will be admitted to hospital and n.p.o. and  supported IV fluids.  She will be treated with IV proton pump inhibitor  and pain and nausea will be treated symptomatically.  In the morning,  follow-up obstructive series will be reviewed.  Depending on her  clinical status, will consider a consultation with Dr. Cicero Duck of  General Surgery.  Dr. Arline Asp will also be notified  of the patient's  admission.      Gordy Savers, MD  Electronically Signed     PFK/MEDQ  D:  06/27/2007  T:  06/28/2007  Job:  [id number]

## 2010-10-07 NOTE — Discharge Summary (Signed)
[name], [name]             ACCOUNT NO.:  [id number]   MEDICAL RECORD NO.:  [id number]          PATIENT TYPE:  INP   LOCATION:  1531                         FACILITY:  [facility]   PHYSICIAN:  Valerie A. Felicity Coyer, MDDATE OF BIRTH:  [birth year]   DATE OF ADMISSION:  06/27/2007  DATE OF DISCHARGE:  06/29/2007                               DISCHARGE SUMMARY   DISCHARGE DIAGNOSES:  1. Partial small bowel obstruction presumably secondary to adhesions,      symptomatically and radiographically improved, tolerating clears.  2. Hypokalemia secondary to GI loss prior to admission.  Replacement      ordered prior to discharge.  3. Mild hyponatremia to dehydration status post IV fluids and      resolved.  Discharge sodium 138.  4. Stage IIIC metastatic ovarian cancer.  PET scan September 2008      without mets, outpatient follow-up as previously scheduled.  5. History of remote left breast cancer status post mastectomy.  6. History of GERD.   DISCHARGE MEDICATIONS:  As prior to admission and include:  1. Premarin 0.65 mg tablets once daily.  2. Zofran 4 mg q.6 h p.r.n.  3. Nexium 40 mg once daily.  4. Phenergan suppositories 25 mg q.4 h p.r.n.  5. Ambien 5 mg q.h.s. p.r.n.  6. Multivitamin once daily.  7. Calcium plus D once daily.  8. Coenzyme Q-10 once daily.   DISPOSITION:  The patient is discharged home, tolerating a clear liquid  diet, hemodynamically stable, having bowel movements.  No abdominal pain  and very anxious for discharge from hospital.   FOLLOWUP:  Hospital follow-up is scheduled in the next 48 hours with  primary care physician Dr. Eleonore Chiquito for Friday, February 6 at  11:15 a.m.  The patient is instructed and agrees to return to the  emergency room if she is unable to tolerate p.o., has recurrent  vomiting, developed abdominal pain or fevers or chills following  discharge prior to her follow-up with primary MD.   HOSPITAL COURSE:  PARTIAL SMALL BOWEL  OBSTRUCTION.  The patient is a  very pleasant 70 year old woman with history of recurrent metastatic  ovarian cancer as well as remote left breast cancer, who presented to  her primary care physician office initially January 29 and again  February 2 due to complaints of lower abdominal cramping with vomiting.  She initially insisted her symptoms were due to a GI bug, and she tried  clear liquids at home, but as she was unable to tolerate p.o. at her  follow-up visit due to concerns for bowel obstruction, she was admitted  to the hospital for IV fluids and further evaluation.  CT abdomen and  pelvis on day of admission, February 2, showed dilated small bowel loops  consistent with early small-bowel obstruction but no cause of  obstruction seen.  She was left n.p.o. and treated for electrolyte  abnormalities with IV fluids and potassium replacement as well as p.r.n.  Zofran.  In the first 24 hours, she had no further vomiting, no symptoms  of abdominal pain and follow-up KUB the next morning did show  improvement  with progression of contrast through her bowels.  We,  therefore, allowed her clear liquid diet, which she has tolerated well  for the last 24 hours, though complaining of increased reflux.  Has not  had pain or vomiting.  She is having bowel movements both last night as  well as this morning.  My medical opinion is to persist with clear  liquids for another 24 hours prior to advancing to a solid regular diet.  Her KUB does show continued improvement with only a single prominent  loop of small bowel in the right abdomen and progression of contrast  into rectum.  However, given the patient's previous history of cancer  and dislike for hospitalization after careful consideration, the patient  has agreed to continue a clear liquid diet on her own at home with  p.r.n. antiemetics and reflux medications as prior to admission with  close follow-up at her primary MD, prior to advancing her  diet.  She has  agreed if she has recurrence of her vomiting, inability to tolerate p.o.  hydration or medications development of pain or fever that she will  return to the emergency room or contact her primary MD prior to her  scheduled follow-up.  As we have a plan in place, I do feel the patient  is otherwise medically stable for discharge home with management and  follow-up as scheduled.      Valerie A. Felicity Coyer, MD  Electronically Signed     VAL/MEDQ  D:  06/29/2007  T:  06/30/2007  Job:  [id number]

## 2010-10-07 NOTE — Op Note (Signed)
[name], [name]             ACCOUNT NO.:  [id number]   MEDICAL RECORD NO.:  [id number]          PATIENT TYPE:  AMB   LOCATION:  DSC                          FACILITY:  [facility]   PHYSICIAN:  Robert A. Thurston Hole, M.D. DATE OF BIRTH:  08-03-1940   DATE OF PROCEDURE:  08/27/2008  DATE OF DISCHARGE:                               OPERATIVE REPORT   PREOPERATIVE DIAGNOSIS:  Left ankle bimalleolar fracture.   POSTOPERATIVE DIAGNOSIS:  Left ankle bimalleolar fracture.   PROCEDURE:  Open reduction and internal fixation of left ankle  bimalleolar fracture.   SURGEON:  Elana Alm. Thurston Hole, MD   ASSISTANT:  Julien Girt, PA   ANESTHESIA:  General.   OPERATIVE TIME:  One hour.   COMPLICATIONS:  None.   INDICATION FOR PROCEDURE:  [name] is a 70 year old woman who  injured her left ankle with a twisting injury 1 week ago.  Exam and x-  rays have revealed a slightly displaced bimalleolar fracture and is now  to undergo ORIF of this.   DESCRIPTION:  [name] was brought to the operating room on August 27, 2008, after a popliteal block had been placed in holding area by  Anesthesia.  She was placed on the operating table in supine position.  She received Ancef 1g IV preoperatively for prophylaxis.  After being  placed under general anesthesia, her left foot, leg, and ankle was  prepped using sterile DuraPrep and draped using sterile technique.  The  foot and leg was exsanguinated and a calf tourniquet elevated to 275  mmHg.  Initially, the distal fibula was exposed through a 5-cm  longitudinal lateral incision.  Underlying subcutaneous tissues were  incised along with skin incision.  The peroneal tendons and sural nerve  were carefully protected posteriorly.  Subperiosteally, the fracture was  exposed.  The fracture was debrided and then was reduced in an anatomic  position while one anterior-to-posterior 4.0-mm lag screw was used to  provisionally fix the fracture and then  a 6-hole one-third tubular plate  was placed on the posterolateral aspect of the fracture.  The two most  distal screw holes drilled, measured, tapped in the appropriate length,  4.0-mm screw was placed in the two most proximal screw holes, drilled,  measured, tapped in the appropriate length, a 3.5-mm cortical screw was  placed.  Intraoperative fluoroscopy confirmed anatomic reduction of the  fibula fracture in satisfactory position of hardware.  At this point, a  4-cm anterior and medial curvilinear incision was made over the medial  malleolus.  The underlying subcutaneous tissues were incised along with  skin incision.  The saphenous vein carefully protected as well as the  posterior tibial tendon and posterior neurovascular bundle.  The  fracture was exposed and then held with a reduction clamp while 2  separate K-wires from the cannulated 4.0-mm screw set were placed.  After these K-wires were placed, intraoperative fluoroscopy confirmed  satisfactory position of the pins and satisfactory reduction of the  fracture.  At this point, each of these were measured and then  overdrilled with a 2.7-mm drill and then each of these  had a cannulated  4.0-mm screw placed one 46 and one 36 mm, holding the fracture in a  reduced region in the anatomic position.  An intraoperative fluoroscopy  confirmed anatomic reduction of the fractures and satisfactory position  of the hardware and anatomic reduction of the mortise.  The syndesmosis  was stressed and tested and it was found to be stable.  At this point,  it was felt that all pathology had been satisfactorily addressed.  The  wounds were irrigated and then closed with periosteum being closed over  both the lateral malleolus and medial malleolus with 2-0 Vicryl.  Subcutaneous tissue was closed with 2-0 Vicryl.  Subcuticular layer  closed with 3-0 Monocryl.  Sterile dressings and a short-leg splint  applied.  Tourniquet was released and the patient  awakened and taken to  recovery room in stable condition.   FOLLOWUP CARE:  [name] will be followed either overnight in  [facility] or discharged home depending on her level of  postoperative pain and oxygenation.  She will be seen back in the office  in a week for sutures out and followup.  She will be discharged on  Dilaudid, Robaxin, and Keflex.      Robert A. Thurston Hole, M.D.  Electronically Signed     RAW/MEDQ  D:  08/27/2008  T:  08/28/2008  Job:  [id number]

## 2010-10-10 NOTE — Op Note (Signed)
NAMEMATYLDA, [name]             ACCOUNT NO.:  [id number]   MEDICAL RECORD NO.:  [id number]          PATIENT TYPE:  INP   LOCATION:  0006                         FACILITY:  [facility]   PHYSICIAN:  Mark C. Vernie Ammons, M.D.  DATE OF BIRTH:  21-Oct-1940   DATE OF PROCEDURE:  07/30/2006  DATE OF DISCHARGE:                               OPERATIVE REPORT   PREOPERATIVE DIAGNOSIS:  Right colon lesion and left pelvic adenopathy  in a patient with history of prior extensive ureteral mobilization and  adjacent surgery resulting in potential significant scarring.   POSTOPERATIVE DIAGNOSIS:  Right colon lesion and left pelvic adenopathy  in a patient with history of prior extensive ureteral mobilization and  adjacent surgery resulting in potential significant scarring.   PROCEDURE:  Cystoscopy, bilateral retrograde pyelograms with  interpretation, bilateral internal-external stent placement.   SURGEON:  Dr. Vernie Ammons   ANESTHESIA:  General.   BLOOD LOSS:  None.   DRAINS:  16-French Foley catheter and 6-French open-ended ureteral  catheters in the right and left ureters.   COMPLICATIONS:  None.   INDICATIONS:  The patient is a 70 year old white female who was found to  have right colon mass and left pelvic adenopathy.  She has a prior  history of cancer and extensive mobilization of her ureters.  In order  to facilitate identification of the ureters intraoperatively, Dr. Jamey Ripa  has asked that I place stents prior to his planned operation.  I have  discussed the procedure with the patient as well as the risks and  complications.  She understands and elects to proceed.   DESCRIPTION OF OPERATION:  After informed consent, the patient brought  to the major OR, placed on the table, administered general anesthesia,  then moved to the dorsal lithotomy position.  Genitalia was sterilely  prepped and draped.  A 22-French cystoscope with 12-degree lens was then  inserted the bladder.  The urethra and  bladder neck were noted to be  normal.  The bladder was then fully inspected and noted to be free of  any tumor, stones or inflammatory lesions.  Ureteral orifices were  normal in configuration and position effluxing clear urine.   An open-ended 6-French ureteral catheter was then placed in the right  ureteral orifice, and full strength Renografin contrast was then  injected under direct fluoroscopic visualization up the right ureter.  The ureter was noted to be nondilated, in its normal anatomic position,  and without filling defect or mass effect.  The intrarenal collecting  system was also noted to be normal.   A 0.038 inch floppy tip guidewire was then passed through the open-ended  stent into the area of the renal pelvis under direct fluoroscopy, and  the open-ended stent was then passed over the guidewire into the renal  pelvis, and the guidewire was removed.   The left ureteral orifice was then identified and cannulated with a 6-  [place] open-end catheter, and a retrograde pyelogram was performed in an  identical fashion.  The study on this side also revealed a normal-  appearing ureter throughout its length without any hydronephrosis or  filling defect or mass effect, and it appeared to follow normal course  to the kidney that appeared normal with no abnormality of the intrarenal  collecting system.  Again, over a guidewire, the open-ended catheter was  then passed into the renal pelvis; guidewire was removed, and I then  drained the bladder and inserted a 16-French Foley catheter.  Both  ureteral catheters were then connected to closed system drainage as was  the Foley catheter.  At the end of the operation, the ureteral stents  can be removed.  I have discussed that with Dr. Jamey Ripa.  The patient  tolerated the procedure well with no intraoperative complications.      Mark C. Vernie Ammons, M.D.  Electronically Signed     MCO/MEDQ  D:  07/30/2006  T:  07/30/2006  Job:   [id number]   cc:   Currie Paris, M.D.  [address], [address]  [place]  [place] [address]

## 2010-10-10 NOTE — H&P (Signed)
[facility]  Patient:    [name], [name]                    MRN: [id number] Adm. Date:  [id number] Attending:  Jeannette Corpus CC:         Samul Dada, M.D.  Luvenia Redden, M.D.  Telford Nab, R.N.   History and Physical  HISTORY OF PRESENT ILLNESS:  A 70 year old white female who returns for continued follow-up for stage III ovarian cancer initially diagnosed in 1998. She received carboplatin and Taxol and had a negative second-look laparotomy in September 1998.  Subsequently, she received six cycles of consolidation chemotherapy using topotecan, finished in April 1999.  Since her last visit, she has done well.  She has seen Dr. Arline Asp and she reports that her tumor markers are normal.  She is currently in a quandary as to whether she should have a right prophylactic mastectomy and is concerned about her daughters risks for familial ovarian and breast cancer gene.  The patient also has a number of questions regarding a new tumor marker which is not currently FDA approved but she is very interested in obtaining it.  It is noted that she does have annual mammograms but has not had a colonoscopy.  REVIEW OF SYSTEMS:  Essentially negative.  FAMILY HISTORY:  The patients family history is notable for maternal side of the family with gynecologic and breast cancers.  PHYSICAL EXAMINATION:  VITAL SIGNS:  Weight 162 pounds, blood pressure 110/65.  GENERAL:  This is a healthy white female in no acute distress.  HEENT:  Negative.  NECK:  Supple without thyromegaly.  LYMPH NODES:  There is no supraclavicular or inguinal adenopathy.  ABDOMEN:  Soft and nontender.  No masses, organomegaly, ascites, or hernias are noted.  PELVIC:  EGBUS normal.  Vagina is clean.  Well-supported.  Bimanual rectovaginal exam revealed no masses, induration, or nodularity.  She does have a fair amount of stool in the rectum.  IMPRESSION:  Stage  IIIC ovarian cancer.  Clinically free of disease now for three years.  The patient will return to see Dr. Lodema Hong in three months, Dr. Arline Asp in six months, and return to see me in nine months. DD:  07/20/00 TD:  07/21/00 Job: [id number]

## 2010-10-10 NOTE — Discharge Summary (Signed)
NAMEIKRAN, PATMAN             ACCOUNT NO.:  [id number]   MEDICAL RECORD NO.:  [id number]          PATIENT TYPE:  INP   LOCATION:  1613                         FACILITY:  [facility]   PHYSICIAN:  Currie Paris, M.D.DATE OF BIRTH:  [birth year]   DATE OF ADMISSION:  07/30/2006  DATE OF DISCHARGE:  08/05/2006                               DISCHARGE SUMMARY   OFFICE MEDICAL RECORD NUMBER [id number].   FINAL DIAGNOSES:  1. 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  2. 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   MEDICAL HISTORY:  Ms. Virden is a 70 year old lady who has undergone  surgery and chemo approximately 10 years ago for ovarian cancer.  She  was recently diagnosed with a recurrence in her ascending colon.  Further evaluation showed a second questionable recurrence in the left  pelvic sidewall adjacent to some clips from her prior surgery.  She was  admitted for surgical resection of her ascending colon and a biopsy  resection of the questionable area in the right pelvic sidewall.   HOSPITAL COURSE:  The patient was admitted and taken to the operating  room on March 7.  She underwent a standard right colectomy, but that  also included resection of the pericolonic fat in the right flank area  since this tumor appeared t be extending out in that direction.  In  addition, a small pelvic sidewall nodule was excised that was adjacent  to some clips and in the location noted on preoperative scans.  Grossly,  there was no evidence of any other disease in the peritoneal cavity  consistent with any ovarian or other cancers.   Postoperatively, she had a fairly benign postoperative course.  She had  an epidural for postop analgesia for a few days and that was removed and  she was switched to PCA Dilaudid.  She developed bowel sounds and her  diet was gradually advanced.  By March 13 she was having minimal pain,  no nausea, tolerating solid  diet, and having bowel movements without  difficulty.   EXAMINATION:  She was afebrile.  LUNGS:  Clear.  ABDOMEN:  Soft and benign and not distended.  Bowel sounds were normal.  Her wound was healing per primum with no evidence of wound infection.   HOSPITAL DATA:  She did have a hemoglobin of 10.1, white count of 4400.  Electrolytes were basically normal, although one postop potassium had  dropped to 3.4, but she was given some supplemental potassium.  Pathology report showed a poorly differentiated carcinoma consistent  with metastatic carcinoma.  Nineteen benign lymph nodes were identified.  On the left pelvic sidewall there was likewise a lymph node with  metastatic carcinoma.   The patient was discharged in satisfactory condition, to resume her  preoperative home medications which basically were Premarin.  She was  given Vicodin for pain.  She is to follow up in my office in  approximately 1 week.  Alternate staples are removed today prior to  discharge.      Currie Paris, M.D.  Electronically Signed  CJS/MEDQ  D:  08/05/2006  T:  08/06/2006  Job:  [id number]   cc:   Samul Dada, M.D.  Fax: [phone]   Gordy Savers, MD  [address] [place]  [place] [address]   De Blanch, M.D.  [address]  [place]  [place] [address]

## 2010-10-10 NOTE — Consult Note (Signed)
   NAME:  [name], [name]                       ACCOUNT NO.:  [id number]   MEDICAL RECORD NO.:  [id number]                   PATIENT TYPE:  OUT   LOCATION:  GYN                                  FACILITY:  [facility]   PHYSICIAN:  Daniel L. Clarke-Pearson, M.D.      DATE OF BIRTH:  [birth year]   DATE OF CONSULTATION:  01/17/2002  DATE OF DISCHARGE:  01/17/2002                                   CONSULTATION   HISTORY OF PRESENT ILLNESS:  A 70 year old white female returns for  continuing follow-up now 71 years since initial diagnosis and treatment of  a stage III ovarian carcinoma.  She was treated with carboplatin and Taxol  and had a negative second look laparotomy in September of 1998.  She  subsequently received six cycles of consolidation chemotherapy (topotecan)  under the direction of Samul Dada, M.D.  Since then, she has been  followed with no evidence of recurrent disease.   Since her last visit, she has done well. She denies any GI or GU symptoms,  has no pelvic pain, pressure, vaginal bleeding or discharge.  Her functional  status is excellent.   REVIEW OF SYSTEMS:  This is essentially negative.   FAMILY HISTORY:  Unchanged from prior notations.   SOCIAL HISTORY:  Unchanged from prior notation.   PHYSICAL EXAMINATION:  GENERAL APPEARANCE:  The patient is a healthy white  female in no acute distress.  VITAL SIGNS:  Weight 150 pounds, blood pressure 110/65.  HEENT:  Negative.  NECK:  Supple without thyromegaly.  There is no supraclavicular or inguinal  adenopathy.  ABDOMEN:  Soft, nontender, no masses, organomegaly, ascites or hernias are  noted.  All incisions are well healed.  PELVIC:  EGBUS, vagina, bladder and urethra are normal.  Cervix and uterus  are surgically absent.  Adnexa without masses.  Rectovaginal exam confirms.  EXTREMITIES:  There is no edema or varicosities.   IMPRESSION:  Stage IIIC ovarian cancer clinically free of disease, now with  five  years of follow-up.  At this juncture I will plan on releasing the  patient from our practice to be followed for annual gynecologic exams by  Luvenia Redden, M.D., and oncology follow-up with Dr. Kimberlee Nearing.                                                  Daniel L. Stanford Breed, M.D.    DLC/MEDQ  D:  01/31/2002  T:  01/31/2002  Job:  [id number]   cc:   Samul Dada, M.D.  [address] [facility]  [place]  [place] [address]  Fax: [phone]   Luvenia Redden, M.D.  [address]  [place]  [place]  [address]  Fax: [phone]   Telford Nab, R.N.

## 2010-10-10 NOTE — Op Note (Signed)
NAMEDONNICE, NIELSEN             ACCOUNT NO.:  [id number]   MEDICAL RECORD NO.:  [id number]          PATIENT TYPE:  AMB   LOCATION:  DSC                          FACILITY:  [facility]   PHYSICIAN:  Rose Phi. Maple Hudson, M.D.   DATE OF BIRTH:  1940/06/23   DATE OF PROCEDURE:  12/18/2004  DATE OF DISCHARGE:                                 OPERATIVE REPORT   PREOPERATIVE DIAGNOSIS:  History of breast and ovarian cancer.   POSTOPERATIVE DIAGNOSIS:  History of breast and ovarian cancer.   OPERATION:  Removal of Port-A-Cath.   SURGEON:  Rose Phi. Maple Hudson, M.D.   ANESTHESIA:  Local.   OPERATIVE PROCEDURE:  The patient placed on the operating table and the left  upper chest prepped and draped in usual fashion. I then injected 1%  Xylocaine with Adrenalin to obtain local anesthesia. A transverse incision  through the old site was then made with exposure of the port. I dissected up  the catheter and then removed it from the subclavian vein and then, with  traction, divided the three sutures holding the port in place and removed  the port. There was no bleeding.   Subcuticular closure with 4-0 Monocryl and Steri-Strips carried out.  Dressing applied. The patient then allowed to go home.       PRY/MEDQ  D:  12/18/2004  T:  12/18/2004  Job:  [id number]

## 2010-10-10 NOTE — Consult Note (Signed)
[facility]  Patient:    [name], [name] Visit Number: [id number] MRN: [id number]          Service Type: GON Location: GYN Attending Physician:  Jeannette Corpus Dictated by:   Rande Brunt. Clarke-Pearson, M.D. Proc. Date: 05/11/01 Admit Date:  05/11/2001   CC:         Samul Dada, M.D.  Luvenia Redden, M.D.  Telford Nab, R.N.   Consultation Report  A 70 year old white female returns for continuing follow-up of stage III ovarian cancer.  This was initially diagnosed in 1998 and after surgical staging and resection she received carboplatin and Taxol.  She had a negative second look laparotomy in September 1998.  Subsequently, she received six cycles of consolidation chemotherapy using ______ under the direction of Dr. Johney Frame.  This was finished in April 1999.  Since then the patient has been followed with no evidence of recurrent disease.  Since her last visit with me the patient has had two visits with Dr. Arline Asp. She apparently has been doing well.  Her tumor markers have been normal.  Today the patient has no complaints.  She specifically denies any GI or GI symptoms.  Has no pelvic pain, pressure, vaginal bleeding or discharge.  REVIEW OF SYSTEMS:  The patient has recently enrolled in a BRCA1 study at [facility] where it sounds that she will have intensive monitoring of her breast imaging. She has no breast symptoms.  FAMILY HISTORY:  Reviewed and unchanged.  SOCIAL HISTORY:  Reviewed and unchanged.  PHYSICAL EXAMINATION  VITAL SIGNS:  Weight 159 pounds, blood pressure 118/80.  GENERAL:  The patient is a healthy white female in no acute distress.  HEENT:  Negative.  NECK:  Supple without thyromegaly.  LYMPH:  There is no supraclavicular or inguinal adenopathy.  ABDOMEN:  Soft, nontender.  No mass, organomegaly, ascites, or hernias noted.  PELVIC:  EGBUS:  Normal.  Vagina is clean, well supported.  Bimanual  and rectovaginal examination reveal no masses, induration, or nodularity.  IMPRESSION:  Stage III ovarian cancer.  No evidence of recurrent or progressive disease.  PLAN:  The patient is to see Dr. Arline Asp next month.  We will defer obtaining tumor markers until he sees her and have him obtain tumor markers as well as our other studies that he routinely gets.  Will ask Ms. Riches to return to see [place] in six months for continuing follow-up. Dictated by:   Rande Brunt. Clarke-Pearson, M.D. Attending Physician:  Jeannette Corpus DD:  05/11/01 TD:  05/11/01 Job: [id number]

## 2010-10-10 NOTE — Op Note (Signed)
NAMEBRENNEN, GARDINER             ACCOUNT NO.:  [id number]   MEDICAL RECORD NO.:  [id number]          PATIENT TYPE:  INP   LOCATION:  1613                         FACILITY:  [facility]   PHYSICIAN:  Currie Paris, M.D.DATE OF BIRTH:  1940-10-11   DATE OF PROCEDURE:  07/30/2006  DATE OF DISCHARGE:                               OPERATIVE REPORT   OFFICE MEDICAL RECORD NUMBER [id number].   PREOP DIAGNOSIS:  Recurrent ovarian cancer involving right colon and  possibly left pelvic sidewall.   POSTOPERATIVE DIAGNOSIS:  Recurrent ovarian cancer involving right colon  and possibly left pelvic sidewall.   OPERATION:  Exploratory laparotomy with right hemicolectomy and primary  anastomosis, excisional biopsy of left pelvic sidewall mass, Port-A-Cath  placement.   SURGEON:  Currie Paris, M.D.   ASSISTANT:  Rose Phi. Maple Hudson, M.D.   ANESTHESIA:  General.   CLINICAL HISTORY:  Ms. Borger is a 70 year old lady who is status  post ovarian cancer surgery in 1998.  She has done well without evidence  of recurrence when she developed abdominal pain left midabdomen; and  then CT showed what appeared to be a lesion in the ascending colon.  A 4-  cm lesion was found and biopsied and found to be carcinoma.  Further  studies suggested that this was likely to be ovarian in origin.  A PET  scan showed also a hot area with a small node adjacent to some clips on  the left pelvic sidewall.  It was planned for further chemo, so she was  going to need a Port-A-Cath as well.   We elected to proceed to a partial colectomy and biopsy of the left  pelvic sidewall mass, as accessible, as well as Port-A-Cath placement.   DESCRIPTION OF PROCEDURE:  The patient was seen in the holding area, and  we had no further questions.  We marked the right side of the abdomen as  the side for the colectomy.  The patient was taken to the operating  room, and after satisfactory general endotracheal anesthesia had been  obtained; Dr. Ihor Gully placed bilateral ureteral catheters to help  with identification of the ureters.  Once that was done, the entire  abdomen was prepped and draped.  A time-out occurred.   A long midline incision was made and the peroneal cavity entered.  A few  omental adhesions and small bowel adhesions were taken down in the  midline, but the abdomen was remarkably free of adhesions.  There was a  mass readily palpable in the mid ascending colon which appeared stuck to  the lateral abdominal wall.   Initially I mobilized the small bowel a little bit and incised the  peritoneum with the cautery from the white line of Toldt, by the cecum,  up and around the mass leaving the mass posterior, but temporarily still  stuck.  I then freed up the hepatic flexure using either cautery or  LigaSure.  I took this all the way over and freed this up off of the  duodenum so that the duodenum was pushed safely posteriorly.  I got this  all the  way to the mid transverse colon which was where I decided we  would divide the colon.   I then picked a window and made it in the colon mesentery; and divided  the colon mesentery with the LigaSure going back towards the ascending  colon until I got to an area where things were just a little bit stuck.  I then turned my attention back to the terminal ileum, made a window in  the mesentery, and began dividing the small bowel mesentery going  towards the colon; and, in fact, was able to completely divide the  mesentery so that we had the colon completely freed up in terms of  mesentery.  I did see the ureter where it was readily palpable; and we  avoided injury to that.   At this point then the tumor was simply then just stuck to the lateral  pelvic sidewall.  Using a combination of some blunt dissection, staying  lateral and posterior to this. I removed this by using the LigaSure to  free this up from fibrous bands; and off of the inferior pole of  the  kidney.  Once this was all done.  We had this completely mobilized, and  up into the middle of the wound.  I tacked the antimesenteric borders  together; and then opened the antimesenteric border of the small bowel  and colon in an area where I was planning to resect.  The GIA was placed  and fired.  There was no bleeding of the staple line.   The TA-60 was then put across all layers and fired, cutting off and  resecting the specimen which was sent to pathology.  They were alerted  so that they could do onco-type studies for chemosensitivity.  The  anastomosis appeared be nicely patent.  There was no tension, and the  blood supply appeared good.  The defect in the mesentery was then closed  with interrupted 3-0 silks.  Instruments and gloves were changed; and we  completed an exploration.  There was no evidence of any other  intraperitoneal disease.  The liver appeared normal; and there was no  evidence of any peritoneal implants.   With attention down into the area of the sigmoid, I freed that up where  it was stuck to the lateral pelvic wall; and eventually I was able to  palpate a small retroperitoneal nodule in the vicinity, that was seen on  the CT scan.  I opened the peritoneum; and saw some clips here; and just  adjacent to it was about a 5 mm nodule that was quite firm; and this was  excised with cautery.  There was no significant bleeding.  This was just  anterior to the catheter palpable in the ureter.  This area appeared to  be dry.  I could not really appreciate any other adenopathy or any other  pelvic masses whatsoever.   We then irrigated and made sure that everything was dry rechecking all  the areas we had worked at before.  I used some large clips to outline  the perineal incision on the lateral pelvic sidewall so that this would  be identifiable should she become a candidate for any radiation therapy in that area.  The abdomen was then closed with a running  looped PDS.  The subcu was irrigated and the skin closed with staples.  The patient  tolerated this portion of the procedure well.  Sterile dressings were  applied.   The patient was repositioned  with her arms at her side and in  Trendelenburg; and the upper chest was prepped and draped as a sterile  field for the Port-A-Cath.  Another time-out occurred.  The patient had  a previous left Port-A-Cath which had been in for 8 years; and she  wished [place] to try to use that.  I made several attempts to access the  left subclavian vein; and never got into the vein, but got into the  artery twice.  As I was inserting the needle it felt very fibrosis as  there was a fair amount of scar tissue; and I thought perhaps this vein  was no longer as functional as previous and not good for Port-A-Cath  placement.  I, therefore, turned my attention to the right side.  On the  initial attempt I entered the right subclavian vein and readily threaded  the guidewire which was checked fluoroscopically.  I then made a short  incision on the anterior chest wall above her implants and made a pocket  for the reservoir.  We used the power port device.   Once that was made, I made a tunnel and brought the tubing through.  Using a dilator and peel-away sheath, I placed those over the guidewire  and the dilator and the guidewire were removed.  The catheter was  threaded easily in and was threaded to approximately 19 cm.  It  aspirated and irrigated easily.  Using fluoro with a little contrast in  the catheter, I backed the catheter up until it appeared to be in the  distal SVC.  It aspirated and flushed easily.   The reservoir was flushed, attached and a locking mechanism engaged.  This aspirated and irrigated easily.  It was placed in the pocket and  secured with some Vicryl.  The final fluoro was done and we showed no  problems.  The tip appeared to be in good position with no kinking.  The  incision was then  closed with 3-0 Vicryl, 4-0 Monocryl subcuticular and  Dermabond.  The patient tolerated this portion of the procedure well,  also.  There no operative complications.  All counts were correct.      Currie Paris, M.D.  Electronically Signed     CJS/MEDQ  D:  07/30/2006  T:  07/30/2006  Job:  [id number]   cc:   Samul Dada, M.D.  Fax: [phone]   Gordy Savers, MD  [address] [place]  [place] [address]   De Blanch, M.D.  [address]  [place]  [place] [address]

## 2010-11-04 ENCOUNTER — Other Ambulatory Visit (HOSPITAL_COMMUNITY): Payer: Self-pay | Admitting: Oncology

## 2010-11-04 ENCOUNTER — Encounter (HOSPITAL_BASED_OUTPATIENT_CLINIC_OR_DEPARTMENT_OTHER): Payer: Medicare Other | Admitting: Oncology

## 2010-11-04 DIAGNOSIS — C50919 Malignant neoplasm of unspecified site of unspecified female breast: Secondary | ICD-10-CM

## 2010-11-04 DIAGNOSIS — Z8543 Personal history of malignant neoplasm of ovary: Secondary | ICD-10-CM

## 2010-11-04 DIAGNOSIS — Z853 Personal history of malignant neoplasm of breast: Secondary | ICD-10-CM

## 2010-11-04 DIAGNOSIS — C569 Malignant neoplasm of unspecified ovary: Secondary | ICD-10-CM

## 2010-11-04 LAB — CBC WITH DIFFERENTIAL/PLATELET
Basophils Absolute: 0 10*3/uL (ref 0.0–0.1)
HCT: 42.1 % (ref 34.8–46.6)
HGB: 14.4 g/dL (ref 11.6–15.9)
MONO#: 0.5 10*3/uL (ref 0.1–0.9)
NEUT#: 3.2 10*3/uL (ref 1.5–6.5)
NEUT%: 63.8 % (ref 38.4–76.8)
RDW: 13.1 % (ref 11.2–14.5)
WBC: 5 10*3/uL (ref 3.9–10.3)
lymph#: 1.1 10*3/uL (ref 0.9–3.3)

## 2010-11-04 LAB — COMPREHENSIVE METABOLIC PANEL
AST: 15 U/L (ref 0–37)
Albumin: 4 g/dL (ref 3.5–5.2)
Alkaline Phosphatase: 79 U/L (ref 39–117)
BUN: 11 mg/dL (ref 6–23)
Calcium: 9 mg/dL (ref 8.4–10.5)
Chloride: 106 mEq/L (ref 96–112)
Glucose, Bld: 82 mg/dL (ref 70–99)
Potassium: 4.1 mEq/L (ref 3.5–5.3)
Sodium: 141 mEq/L (ref 135–145)
Total Protein: 6.4 g/dL (ref 6.0–8.3)

## 2010-12-30 ENCOUNTER — Encounter (HOSPITAL_BASED_OUTPATIENT_CLINIC_OR_DEPARTMENT_OTHER): Payer: Medicare Other | Admitting: Oncology

## 2010-12-30 DIAGNOSIS — C569 Malignant neoplasm of unspecified ovary: Secondary | ICD-10-CM

## 2010-12-30 DIAGNOSIS — Z452 Encounter for adjustment and management of vascular access device: Secondary | ICD-10-CM

## 2011-01-07 ENCOUNTER — Encounter: Payer: Self-pay | Admitting: Internal Medicine

## 2011-01-08 ENCOUNTER — Encounter: Payer: Self-pay | Admitting: Internal Medicine

## 2011-01-08 ENCOUNTER — Ambulatory Visit (INDEPENDENT_AMBULATORY_CARE_PROVIDER_SITE_OTHER): Payer: Medicare Other | Admitting: Internal Medicine

## 2011-01-08 VITALS — BP 120/76 | Temp 98.2°F | Wt 161.0 lb

## 2011-01-08 DIAGNOSIS — M542 Cervicalgia: Secondary | ICD-10-CM

## 2011-01-08 MED ORDER — CYCLOBENZAPRINE HCL 5 MG PO TABS: 5.0000 mg | ORAL_TABLET | Freq: Three times a day (TID) | ORAL | Status: DC | PRN

## 2011-01-08 NOTE — Patient Instructions (Signed)
You  may move around, but avoid painful motions and activities.  Apply heat  to the sore area for 15 to 20 minutes 3 or 4 times daily for the next two to 3 days.  Gentle stretching and range of motion exercises  Celebrex 200 mg twice daily  Call or return to clinic prn if these symptoms worsen or fail to improve as anticipated.
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
## 2011-02-10 ENCOUNTER — Other Ambulatory Visit: Payer: Self-pay | Admitting: Gynecology

## 2011-02-10 DIAGNOSIS — Z1231 Encounter for screening mammogram for malignant neoplasm of breast: Secondary | ICD-10-CM

## 2011-02-13 LAB — URINALYSIS, ROUTINE W REFLEX MICROSCOPIC
Bilirubin Urine: NEGATIVE
Hgb urine dipstick: NEGATIVE
Nitrite: NEGATIVE
Specific Gravity, Urine: 1.04 — ABNORMAL HIGH
pH: 6

## 2011-02-13 LAB — CBC
HCT: 41.1
MCHC: 35.3
MCV: 94
Platelets: 210
RDW: 11.9

## 2011-02-13 LAB — COMPREHENSIVE METABOLIC PANEL
Albumin: 3.3 — ABNORMAL LOW
BUN: 15
Creatinine, Ser: 0.91
Glucose, Bld: 104 — ABNORMAL HIGH
Total Protein: 6.6

## 2011-02-13 LAB — BASIC METABOLIC PANEL
CO2: 29
GFR calc Af Amer: >60
GFR calc non Af Amer: >60
Glucose, Bld: 116 — ABNORMAL HIGH
Potassium: 3.1 — ABNORMAL LOW
Sodium: 138

## 2011-02-13 LAB — LIPASE, BLOOD: Lipase: 29

## 2011-02-24 ENCOUNTER — Other Ambulatory Visit (HOSPITAL_COMMUNITY): Payer: Self-pay | Admitting: Oncology

## 2011-02-24 ENCOUNTER — Encounter (HOSPITAL_BASED_OUTPATIENT_CLINIC_OR_DEPARTMENT_OTHER): Payer: Medicare Other | Admitting: Oncology

## 2011-02-24 DIAGNOSIS — Z853 Personal history of malignant neoplasm of breast: Secondary | ICD-10-CM

## 2011-02-24 DIAGNOSIS — C569 Malignant neoplasm of unspecified ovary: Secondary | ICD-10-CM

## 2011-02-24 DIAGNOSIS — C50919 Malignant neoplasm of unspecified site of unspecified female breast: Secondary | ICD-10-CM

## 2011-02-24 DIAGNOSIS — Z8543 Personal history of malignant neoplasm of ovary: Secondary | ICD-10-CM

## 2011-02-24 LAB — CBC WITH DIFFERENTIAL/PLATELET
Basophils Absolute: 0 10*3/uL (ref 0.0–0.1)
EOS%: 4 % (ref 0.0–7.0)
Eosinophils Absolute: 0.2 10*3/uL (ref 0.0–0.5)
HCT: 45.1 % (ref 34.8–46.6)
HGB: 15.5 g/dL (ref 11.6–15.9)
MCH: 31.6 pg (ref 25.1–34.0)
MONO#: 0.4 10*3/uL (ref 0.1–0.9)
NEUT#: 3.7 10*3/uL (ref 1.5–6.5)
NEUT%: 64 % (ref 38.4–76.8)
lymph#: 1.4 10*3/uL (ref 0.9–3.3)

## 2011-02-24 LAB — COMPREHENSIVE METABOLIC PANEL
ALT: 25 U/L (ref 0–35)
Albumin: 4.1 g/dL (ref 3.5–5.2)
CO2: 22 mEq/L (ref 19–32)
Calcium: 9.5 mg/dL (ref 8.4–10.5)
Chloride: 105 mEq/L (ref 96–112)
Glucose, Bld: 82 mg/dL (ref 70–99)
Potassium: 4.2 mEq/L (ref 3.5–5.3)
Sodium: 140 mEq/L (ref 135–145)
Total Protein: 6.8 g/dL (ref 6.0–8.3)

## 2011-02-24 LAB — CANCER ANTIGEN 27.29: CA 27.29: 25 U/mL (ref 0–39)

## 2011-02-24 LAB — LACTATE DEHYDROGENASE: LDH: 141 U/L (ref 94–250)

## 2011-02-24 LAB — GLUCOSE, CAPILLARY: Glucose-Capillary: 101 — ABNORMAL HIGH

## 2011-02-25 ENCOUNTER — Ambulatory Visit
Admission: RE | Admit: 2011-02-25 | Discharge: 2011-02-25 | Disposition: A | Discharge status: Home / Self Care | Payer: Medicare Other | Source: Ambulatory Visit | Attending: Gynecology | Admitting: Gynecology

## 2011-02-25 DIAGNOSIS — Z1231 Encounter for screening mammogram for malignant neoplasm of breast: Secondary | ICD-10-CM

## 2011-03-09 ENCOUNTER — Encounter (HOSPITAL_COMMUNITY)
Admission: RE | Admit: 2011-03-09 | Discharge: 2011-03-09 | Disposition: A | Discharge status: Home / Self Care | Payer: Medicare Other | Source: Ambulatory Visit | Attending: Oncology | Admitting: Oncology

## 2011-03-09 DIAGNOSIS — C50919 Malignant neoplasm of unspecified site of unspecified female breast: Secondary | ICD-10-CM | POA: Insufficient documentation

## 2011-03-09 DIAGNOSIS — C569 Malignant neoplasm of unspecified ovary: Secondary | ICD-10-CM

## 2011-03-09 MED ORDER — FLUDEOXYGLUCOSE F - 18 (FDG) INJECTION
15.2000 | Freq: Once | INTRAVENOUS | Status: AC | PRN
  Administered 2011-03-09: 15.2 via INTRAVENOUS

## 2011-03-12 ENCOUNTER — Other Ambulatory Visit: Payer: Self-pay | Admitting: Gynecology

## 2011-04-07 ENCOUNTER — Telehealth: Payer: Self-pay | Admitting: Internal Medicine

## 2011-04-07 MED ORDER — TRAMADOL HCL 50 MG PO TABS: 50.0000 mg | ORAL_TABLET | Freq: Four times a day (QID) | ORAL | Status: DC | PRN

## 2011-04-07 NOTE — Telephone Encounter (Signed)
Pt woke up yesterday a.m. With scratchy throat, which has gotten progressively worse and is now accompanied by cough. Pt took zicam, econatia, gargled with salt water, and nothing has helped. Pt req work in [employer] or a med called in to [employer] [phone].

## 2011-04-07 NOTE — Telephone Encounter (Signed)
Get plenty of rest, Drink lots of  clear liquids, and use Tylenol or ibuprofen for fever and discomfort.   Mucinex  DM one twice daily  Tramadol #30  50 mg one every 6 hours as needed for pain

## 2011-04-07 NOTE — Telephone Encounter (Signed)
Spoke with [name] - informed of drVernon Prey instructions and rx to be sent to phamrmacy

## 2011-04-07 NOTE — Telephone Encounter (Signed)
Please advise - no avilb appt for any provider today

## 2011-04-10 ENCOUNTER — Telehealth: Payer: Self-pay | Admitting: Internal Medicine

## 2011-04-10 MED ORDER — BENZONATATE 200 MG PO CAPS: 200.0000 mg | ORAL_CAPSULE | Freq: Three times a day (TID) | ORAL | Status: AC | PRN

## 2011-04-10 NOTE — Telephone Encounter (Signed)
Pt called and said that she is worse. Pt has chest congestion and severe non productive cough. Pt has been taking Mucinex as suggestion. Pt doesn't know if she needs a zpak or different med. Req call back from nurse asap.

## 2011-04-10 NOTE — Telephone Encounter (Signed)
Spoke with  Pt - informed pt of med to call in - if no improvement next week or fever - we will need to see

## 2011-04-10 NOTE — Telephone Encounter (Signed)
Generic tessalon 200  #30  One every 8 hrs;  Add Mucinex DM  Use BID

## 2011-04-10 NOTE — Telephone Encounter (Signed)
Congestion since Monday am with cough.  Coughs all night and cough is mostly non productive, but very little URI.

## 2011-04-17 ENCOUNTER — Encounter: Payer: Self-pay | Admitting: Internal Medicine

## 2011-04-17 ENCOUNTER — Ambulatory Visit (INDEPENDENT_AMBULATORY_CARE_PROVIDER_SITE_OTHER): Payer: Medicare Other | Admitting: Internal Medicine

## 2011-04-17 VITALS — BP 122/70 | Temp 98.2°F | Wt 163.0 lb

## 2011-04-17 DIAGNOSIS — IMO0002 Reserved for concepts with insufficient information to code with codable children: Secondary | ICD-10-CM

## 2011-04-17 MED ORDER — FLUCONAZOLE 150 MG PO TABS: 150.0000 mg | ORAL_TABLET | Freq: Once | ORAL | Status: AC

## 2011-04-17 MED ORDER — AMOXICILLIN-POT CLAVULANATE 875-125 MG PO TABS: 1.0000 | ORAL_TABLET | Freq: Two times a day (BID) | ORAL | Status: AC

## 2011-04-17 NOTE — Patient Instructions (Signed)
Soak right hand for 10 or 15 minutes 4 times daily  Gentle massage from proximal to distal as discussed  Take your antibiotic as prescribed until ALL of it is gone, but stop if you develop a rash, swelling, or any side effects of the medication.  Contact our office as soon as possible if  there are side effects of the medication.  Call or return to clinic prn if these symptoms worsen or fail to improve as anticipated.

## 2011-04-17 NOTE — Progress Notes (Signed)
  Subjective:    Patient ID: [name], female    DOB: 02/18/1941, 70 y.o.   MRN: [id number]  HPI 70 year old patient who presents with a three-day history of increasing pain and swelling involving her distal right index finger. No obvious trauma but does do gardening. No fever chills or systemic complaints    Review of Systems  Skin: Positive for wound.       Objective:   Physical Exam  Constitutional: She appears well-developed and well-nourished. No distress.  Skin:       Acute paronychia involving the right index finger and most marked medially          Assessment & Plan:   Procedure Note:  After Betadine and alcohol prep the right index finger was anesthetized using a digital nerve block. An incision and drainage was performed using a scalpel tip and the wound dressed after antibiotic ointment applied. Local wound care discussed  Paronychia right index finger  We'll treat with antibiotic therapy frequent soaks. We'll call if unimproved

## 2011-04-20 ENCOUNTER — Encounter: Payer: Self-pay | Admitting: Internal Medicine

## 2011-04-27 ENCOUNTER — Encounter: Payer: Self-pay | Admitting: *Deleted

## 2011-04-28 ENCOUNTER — Ambulatory Visit (HOSPITAL_BASED_OUTPATIENT_CLINIC_OR_DEPARTMENT_OTHER): Payer: Medicare Other

## 2011-04-28 VITALS — BP 114/63 | HR 69 | Temp 97.0°F

## 2011-04-28 DIAGNOSIS — Z452 Encounter for adjustment and management of vascular access device: Secondary | ICD-10-CM

## 2011-04-28 DIAGNOSIS — C569 Malignant neoplasm of unspecified ovary: Secondary | ICD-10-CM

## 2011-04-28 DIAGNOSIS — C801 Malignant (primary) neoplasm, unspecified: Secondary | ICD-10-CM

## 2011-04-28 MED ORDER — SODIUM CHLORIDE 0.9 % IJ SOLN
10.0000 mL | Freq: Once | INTRAMUSCULAR | Status: AC
  Administered 2011-04-28: 10 mL via INTRAVENOUS
  Filled 2011-04-28: qty 10

## 2011-04-28 MED ORDER — HEPARIN SOD (PORK) LOCK FLUSH 100 UNIT/ML IV SOLN
500.0000 [IU] | Freq: Once | INTRAVENOUS | Status: AC
  Administered 2011-04-28: 500 [IU] via INTRAVENOUS
  Filled 2011-04-28: qty 5

## 2011-05-11 ENCOUNTER — Ambulatory Visit (INDEPENDENT_AMBULATORY_CARE_PROVIDER_SITE_OTHER): Payer: Medicare Other | Admitting: Internal Medicine

## 2011-05-11 ENCOUNTER — Encounter: Payer: Self-pay | Admitting: Internal Medicine

## 2011-05-11 VITALS — BP 118/70 | HR 64 | Temp 97.9°F | Resp 16 | Ht 67.5 in | Wt 159.0 lb

## 2011-05-11 DIAGNOSIS — E039 Hypothyroidism, unspecified: Secondary | ICD-10-CM | POA: Insufficient documentation

## 2011-05-11 DIAGNOSIS — Z853 Personal history of malignant neoplasm of breast: Secondary | ICD-10-CM

## 2011-05-11 DIAGNOSIS — Z0000 Encounter for general adult medical examination without abnormal findings: Secondary | ICD-10-CM

## 2011-05-11 DIAGNOSIS — Z8543 Personal history of malignant neoplasm of ovary: Secondary | ICD-10-CM

## 2011-05-11 HISTORY — DX: Hypothyroidism, unspecified: E03.9

## 2011-05-11 MED ORDER — LEVOTHYROXINE SODIUM 88 MCG PO TABS: 88.0000 ug | ORAL_TABLET | Freq: Every day | ORAL | Status: DC

## 2011-05-11 MED ORDER — CYCLOBENZAPRINE HCL 5 MG PO TABS: 5.0000 mg | ORAL_TABLET | Freq: Three times a day (TID) | ORAL | Status: DC | PRN

## 2011-05-11 NOTE — Progress Notes (Signed)
Subjective:    Patient ID: [name], female    DOB: 1941-02-02, 70 y.o.   MRN: [id number]  HPI CC: cpx - doing well.   History of Present Illness:   70 year-old patient who is seen today for a wellness exam. She is followed closely by oncology due to a history of prior breast and ovarian cancer. She is doing quite well. She has had a recent PET scan that revealed no active disease  Here for Medicare AWV:   1. Risk factors based on Past M, S, F history: include a family history of cardiovascular disease, only  2. Physical Activities: remains quite active participates at a local health club  3. Depression/mood: no history of depression or mood disorder  4. Hearing: no deficits  5. ADL's: independent in all aspects of daily living  6. Fall Risk: low  7. Home Safety: no cause identified  8. Height, weight, &visual acuity:height and weight are stable. No change in visual acuity  9. Counseling: regular exercise, heart healthy diet. Encouraged  10. Labs ordered based on risk factors: laboratory profile included lipid panel reviewed  11. Referral Coordination- follow-up oncology, and GYN  12. Care Plan- follow-up oncology and gynecology  13. Cognitive Assessment- alert and oriented, with normal affect. No memory dysfunction. No problems with executive functioning, and handles all personal affairs   Preventive Screening-Counseling & Management  Alcohol-Tobacco  Smoking Status: never   Allergies:  1) ! Codeine Sulfate (Codeine Sulfate)  2) ! Promethazine Hcl (Promethazine Hcl)   Past History:  Past Medical History:  Reviewed history from 06/06/2009 and no changes required.  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  recurrent ovarian carcinoma, February 2008  admit February 2009 partial small bowel obstruction treated medically   Past Surgical History:  Thyroid biopsy for benign disease  colonoscopy 2004; 2/08  left ankle fracture, status post surgery, March 2010  Cholecystectomy   Family History:  Reviewed history from 03/23/2008 and no changes required.  father died age 7, following gallbladder surgery; history of abdominal aortic aneurysm and cerebrovascular disease  Mother died of ovarian cancer, history of melanoma  Two brothers; one brother, status post CABG (possible stenting only), complex diverticular disease   Social History:  Reviewed history from 03/23/2008 and no changes required.  Married  Smoking Status: never     Review of Systems  Constitutional: Negative for fever, appetite change, fatigue and unexpected weight change.  HENT: Negative for hearing loss, ear pain, nosebleeds, congestion, sore throat, mouth sores, trouble swallowing, neck stiffness, dental problem, voice change, sinus pressure and tinnitus.   Eyes: Negative for photophobia, pain, redness and visual disturbance.  Respiratory: Negative for cough, chest tightness and shortness of breath.   Cardiovascular: Negative for chest pain, palpitations and leg swelling.  Gastrointestinal: Negative for nausea, vomiting, abdominal pain, diarrhea, constipation, blood in stool, abdominal distention and rectal pain.  Genitourinary: Negative for dysuria, urgency, frequency, hematuria, flank pain, vaginal bleeding, vaginal discharge, difficulty urinating, genital sores, vaginal pain, menstrual problem and pelvic pain.  Musculoskeletal: Negative for back pain and arthralgias.  Skin: Negative for  rash.  Neurological: Negative for dizziness, syncope, speech difficulty,  weakness, light-headedness, numbness and headaches.  Hematological: Negative for adenopathy. Does not bruise/bleed easily.  Psychiatric/Behavioral: Negative for suicidal ideas, behavioral problems, self-injury, dysphoric mood and agitation. The patient is not nervous/anxious.        Objective:   Physical Exam  Constitutional: She is oriented to person, place, and time. She appears well-developed and well-nourished.  HENT:  Head: Normocephalic and atraumatic.  Right Ear: External ear normal.  Left Ear: External ear normal.  Mouth/Throat: Oropharynx is clear and moist.  Eyes: Conjunctivae and EOM are normal.  Neck: Normal range of motion. Neck supple. No JVD present. No thyromegaly present.  Cardiovascular: Normal rate, regular rhythm, normal heart sounds and intact distal pulses.   No murmur heard. Pulmonary/Chest: Effort normal and breath sounds normal. She has no wheezes. She has no rales.  Abdominal: Soft. Bowel sounds are normal. She exhibits no distension and no mass. There is no tenderness. There is no rebound and no guarding.  Musculoskeletal: Normal range of motion. She exhibits no edema and no tenderness.  Neurological: She is alert and oriented to person, place, and time. She has normal reflexes. No cranial nerve deficit. She exhibits normal muscle tone. Coordination normal.  Skin: Skin is warm and dry. No rash noted.  Psychiatric: She has a normal mood and affect. Her behavior is normal.          Assessment & Plan:    Preventive health exam Hypothyroidism History of ovarian and breast cancer  Followup oncology and gynecology. Recheck in one year Medications refill

## 2011-05-11 NOTE — Patient Instructions (Signed)
Take a calcium supplement, plus [phone] units of vitamin D    It is important that you exercise regularly, at least 20 minutes 3 to 4 times per week.  If you develop chest pain or shortness of breath seek  medical attention.  Limit your sodium (Salt) intake

## 2011-06-06 ENCOUNTER — Telehealth: Payer: Self-pay | Admitting: Oncology

## 2011-06-06 NOTE — Telephone Encounter (Signed)
l/m with appt info for 06/2011 and mailed sch      aom

## 2011-07-14 ENCOUNTER — Telehealth: Payer: Self-pay | Admitting: Oncology

## 2011-07-14 ENCOUNTER — Ambulatory Visit (HOSPITAL_BASED_OUTPATIENT_CLINIC_OR_DEPARTMENT_OTHER): Payer: Medicare Other | Admitting: Oncology

## 2011-07-14 ENCOUNTER — Ambulatory Visit (HOSPITAL_BASED_OUTPATIENT_CLINIC_OR_DEPARTMENT_OTHER): Payer: Medicare Other

## 2011-07-14 ENCOUNTER — Other Ambulatory Visit: Payer: Medicare Other | Admitting: Lab

## 2011-07-14 ENCOUNTER — Other Ambulatory Visit: Payer: Self-pay | Admitting: Oncology

## 2011-07-14 ENCOUNTER — Encounter: Payer: Self-pay | Admitting: Oncology

## 2011-07-14 DIAGNOSIS — E039 Hypothyroidism, unspecified: Secondary | ICD-10-CM | POA: Diagnosis not present

## 2011-07-14 DIAGNOSIS — Z8543 Personal history of malignant neoplasm of ovary: Secondary | ICD-10-CM

## 2011-07-14 DIAGNOSIS — Z853 Personal history of malignant neoplasm of breast: Secondary | ICD-10-CM | POA: Diagnosis not present

## 2011-07-14 DIAGNOSIS — C569 Malignant neoplasm of unspecified ovary: Secondary | ICD-10-CM | POA: Diagnosis not present

## 2011-07-14 DIAGNOSIS — Z452 Encounter for adjustment and management of vascular access device: Secondary | ICD-10-CM | POA: Diagnosis not present

## 2011-07-14 DIAGNOSIS — Z469 Encounter for fitting and adjustment of unspecified device: Secondary | ICD-10-CM | POA: Diagnosis not present

## 2011-07-14 LAB — CBC WITH DIFFERENTIAL/PLATELET
EOS%: 3.6 % (ref 0.0–7.0)
Eosinophils Absolute: 0.2 10*3/uL (ref 0.0–0.5)
LYMPH%: 23.3 % (ref 14.0–49.7)
MCH: 31.9 pg (ref 25.1–34.0)
MCV: 94.4 fL (ref 79.5–101.0)
MONO%: 8.2 % (ref 0.0–14.0)
Platelets: 192 10*3/uL (ref 145–400)
RBC: 4.71 10*6/uL (ref 3.70–5.45)
RDW: 13.4 % (ref 11.2–14.5)

## 2011-07-14 LAB — COMPREHENSIVE METABOLIC PANEL
AST: 29 U/L (ref 0–37)
Albumin: 4 g/dL (ref 3.5–5.2)
BUN: 12 mg/dL (ref 6–23)
Calcium: 9.4 mg/dL (ref 8.4–10.5)
Chloride: 104 mEq/L (ref 96–112)
Creatinine, Ser: 1.02 mg/dL (ref 0.50–1.10)
Glucose, Bld: 95 mg/dL (ref 70–99)
Potassium: 4 mEq/L (ref 3.5–5.3)

## 2011-07-14 LAB — TSH: TSH: 3.15 u[IU]/mL (ref 0.350–4.500)

## 2011-07-14 MED ORDER — SODIUM CHLORIDE 0.9 % IJ SOLN
10.0000 mL | INTRAMUSCULAR | Status: DC | PRN
  Administered 2011-07-14: 10 mL via INTRAVENOUS
  Filled 2011-07-14: qty 10

## 2011-07-14 MED ORDER — HEPARIN SOD (PORK) LOCK FLUSH 100 UNIT/ML IV SOLN
500.0000 [IU] | Freq: Once | INTRAVENOUS | Status: AC
  Administered 2011-07-14: 500 [IU] via INTRAVENOUS
  Filled 2011-07-14: qty 5

## 2011-07-14 NOTE — Progress Notes (Signed)
This office note has been dictated.  #[id number]

## 2011-07-14 NOTE — Telephone Encounter (Signed)
appts made for 4 and 12/2011.

## 2011-07-15 NOTE — Progress Notes (Signed)
CC:   [name], M.D. De Blanch, M.D. Luvenia Redden, M.D. Gordy Savers, MD  PROBLEM LIST: 1. Recurrent ovarian cancer with original diagnosis going back to     March 1998.  [name] presented at that time with stage III disease     which was characterized by an elevated CA 27.29.  Her CA 125 was     normal.  [name] underwent debulking surgery, which I believe was     suboptimal.  She then received 6 cycles of carboplatin and Taxol.     She underwent second-look surgery on 02/20/1997 and was found to be     disease free.  She then received adjuvant topotecan from November     1998 through September 10, 1997.  [name] did well until early part of     2008 when she was having abdominal pain and was found to have a     tumor involving the mid ascending colon.  She underwent surgery by     Dr. Cicero Duck on 07/30/2006.  The tumor of the colon which     appeared to be growing from the outside into the colon was     resected.  There was also a left pelvic nodule that contained     tumor.  All of this underwent extensive testing and was found to be     recurrent ovarian cancer rather than primary colon cancer.  [name]     then underwent further chemotherapy with carboplatin and Taxol in     combination with Neulasta for 6 cycles from 09/09/2006 through     12/23/2006.  She remains disease free.  Her most recent PET scan     was carried out on 03/09/2011. 2. Intraductal carcinoma of the left breast diagnosed in 1992, status     post left modified radical mastectomy with all 13 lymph nodes     negative followed by a silicone implant that was later removed     because of leakage on 07/22/2004.  This was replaced with a saline     implant. 3. BRCA mutation positive with strikingly positive family history for     breast and ovarian cancer. 4. History of bilateral silicone breast implants.  Right breast     underwent augmentation in 1981, and following a left modified  radical mastectomy for intraductal carcinoma of the left breast a     silicone implant was placed in 1992.  Both implants were replaced     by saline implants on 07/22/2004.  The left silicone implant     apparently had leaked. 5. History of rosacea. 6. Right Port-A-Cath placed on 07/30/2006.  Currently being maintained     with heparin flushes every 2 months. 7. Hypothyroidism.  MEDICATIONS: 1. B complex vitamins 1 daily. 2. Calcium carbonate and vitamin D 600/400 one tablet daily. 3. Vitamin D 1000 units daily. 4. Coenzyme Q10 one daily. 5. Flexeril 5 mg every 8 hours as needed for muscle spasms. 6. Estrace 1 mg daily.  [name] has been on this for many years. 7. Fish oil omega-3 fatty acids 2000 mg daily. 8. Synthroid 88 mcg tablets 1 daily. 9. Melatonin 200 mcg daily. 10.Theragran 1 tablet daily.  HISTORY:  [name] will soon be 71 years old on March 21st, and is followed here for her recurrent ovarian cancer with original diagnosis going back to March 1998, currently without evidence of disease, as well as her history of intraductal carcinoma of the left breast  dating back to 65. As noted, Tamberlyn carries the BRCA1 mutation and has a strikingly positive family history for breast and ovarian cancer.  [name] was last seen by [place] on 02/24/2011.  We have been seeing her every 4 months recently, and she comes in every 2 months to have her Port-A- Cath flushed with heparin.  [name] continues to do well and is without any major complaints today.  She is without any symptoms to suggest recurrence of either of her cancers.  PHYSICAL EXAMINATION:  General Appearance:  [name] continues to look well.  She looks younger than her stated age.  Vital Signs:  Weight is 161 pounds 8 ounces.  Height 5 feet 7 inches.  Body surface area 1.86 sq m.  Blood pressure 105/60.  Other vital signs are normal.  Weight is stable.  HEENT:  There is no scleral icterus.  Mouth and pharynx are benign.  No peripheral  adenopathy palpable.  Heart and lungs are normal. Breasts:  The left breast was reconstructed after modified radical mastectomy.  Right breast has been augmented.  She has saline implants bilaterally.  There is a right-sided Port-A-Cath that was flushed with heparin today.  Abdomen is benign, nontender with no organomegaly or masses palpable.  Extremities:  No peripheral edema or clubbing.  No obvious lymphedema of either arm.  [name] does have sun-related damage to her skin.  Neurologic:  Exam is grossly normal.  LABORATORY DATA:  From today, white count 6.3, ANC 4.1, hemoglobin 15.0, hematocrit 44.5, and platelets 192,000.  Chemistries from today were normal except for a minimally elevated ALT of 39 with normal being 0-35. CA 27.29 was 24.  It will be recalled that [name] has never had an elevated CA 125, and at the time of diagnosis her CA 27.29 was slightly elevated.  TSH obtained at the request of Dr. Amador Cunas was 3.150.  IMAGING STUDIES: 1. The PET scan from 03/26/2010 showed no specific features to suggest     residual or recurrent tumor. 2. Screening right mammogram from 02/25/2011 was negative. 3. PET scan from 03/09/2011 showed no evidence for recurrent breast or     ovarian cancer.  IMPRESSION AND PLAN:  [name] continues to do extraordinarily well.  She has had a truly extraordinary course.  Her ovarian cancer now goes back 15 years to March 1998.  As noted above, she did have a recurrence of tumor in February 2008, and thus she is now 5 years out from that event which occurred in her 10th year from the diagnosis of her ovarian cancer.  [name]'s Port-A-Cath was flushed with heparin today.  We will have her come back in 2 months for another heparin flush.  We will plan to see her in 4 months at which time we will check CBC, chemistries and a CA 27.29.    ______________________________ Samul Dada, M.D. DSM/MEDQ  D:  07/14/2011  T:  07/15/2011  Job:  [id number]

## 2011-08-24 DIAGNOSIS — L570 Actinic keratosis: Secondary | ICD-10-CM | POA: Diagnosis not present

## 2011-08-24 DIAGNOSIS — L578 Other skin changes due to chronic exposure to nonionizing radiation: Secondary | ICD-10-CM | POA: Diagnosis not present

## 2011-09-08 ENCOUNTER — Ambulatory Visit (HOSPITAL_BASED_OUTPATIENT_CLINIC_OR_DEPARTMENT_OTHER): Payer: Medicare Other

## 2011-09-08 VITALS — BP 124/63 | HR 61 | Temp 97.0°F

## 2011-09-08 DIAGNOSIS — Z452 Encounter for adjustment and management of vascular access device: Secondary | ICD-10-CM | POA: Diagnosis not present

## 2011-09-08 DIAGNOSIS — C50919 Malignant neoplasm of unspecified site of unspecified female breast: Secondary | ICD-10-CM | POA: Diagnosis not present

## 2011-09-08 MED ORDER — SODIUM CHLORIDE 0.9 % IJ SOLN
10.0000 mL | INTRAMUSCULAR | Status: DC | PRN
  Administered 2011-09-08: 10 mL via INTRAVENOUS
  Filled 2011-09-08: qty 10

## 2011-09-08 MED ORDER — HEPARIN SOD (PORK) LOCK FLUSH 100 UNIT/ML IV SOLN
500.0000 [IU] | Freq: Once | INTRAVENOUS | Status: AC
  Administered 2011-09-08: 500 [IU] via INTRAVENOUS
  Filled 2011-09-08: qty 5

## 2011-11-10 ENCOUNTER — Ambulatory Visit: Payer: Medicare Other

## 2011-11-10 ENCOUNTER — Other Ambulatory Visit (HOSPITAL_BASED_OUTPATIENT_CLINIC_OR_DEPARTMENT_OTHER): Payer: Medicare Other | Admitting: Lab

## 2011-11-10 ENCOUNTER — Encounter: Payer: Self-pay | Admitting: Oncology

## 2011-11-10 ENCOUNTER — Ambulatory Visit (HOSPITAL_BASED_OUTPATIENT_CLINIC_OR_DEPARTMENT_OTHER): Payer: Medicare Other | Admitting: Oncology

## 2011-11-10 ENCOUNTER — Telehealth: Payer: Self-pay | Admitting: Oncology

## 2011-11-10 VITALS — BP 130/65 | HR 72 | Temp 97.1°F | Ht 67.0 in | Wt 161.1 lb

## 2011-11-10 DIAGNOSIS — E039 Hypothyroidism, unspecified: Secondary | ICD-10-CM

## 2011-11-10 DIAGNOSIS — C569 Malignant neoplasm of unspecified ovary: Secondary | ICD-10-CM

## 2011-11-10 DIAGNOSIS — Z853 Personal history of malignant neoplasm of breast: Secondary | ICD-10-CM | POA: Diagnosis not present

## 2011-11-10 DIAGNOSIS — Z8543 Personal history of malignant neoplasm of ovary: Secondary | ICD-10-CM

## 2011-11-10 DIAGNOSIS — C50919 Malignant neoplasm of unspecified site of unspecified female breast: Secondary | ICD-10-CM | POA: Diagnosis not present

## 2011-11-10 LAB — CBC WITH DIFFERENTIAL/PLATELET
Basophils Absolute: 0 10*3/uL (ref 0.0–0.1)
Eosinophils Absolute: 0.1 10*3/uL (ref 0.0–0.5)
HGB: 15 g/dL (ref 11.6–15.9)
MONO#: 0.6 10*3/uL (ref 0.1–0.9)
NEUT#: 3.7 10*3/uL (ref 1.5–6.5)
RBC: 4.64 10*6/uL (ref 3.70–5.45)
RDW: 12.7 % (ref 11.2–14.5)
WBC: 5.9 10*3/uL (ref 3.9–10.3)
lymph#: 1.4 10*3/uL (ref 0.9–3.3)

## 2011-11-10 LAB — COMPREHENSIVE METABOLIC PANEL
Albumin: 4 g/dL (ref 3.5–5.2)
Alkaline Phosphatase: 82 U/L (ref 39–117)
BUN: 14 mg/dL (ref 6–23)
CO2: 25 mEq/L (ref 19–32)
Calcium: 9.6 mg/dL (ref 8.4–10.5)
Chloride: 103 mEq/L (ref 96–112)
Glucose, Bld: 76 mg/dL (ref 70–99)
Potassium: 4.2 mEq/L (ref 3.5–5.3)
Sodium: 139 mEq/L (ref 135–145)
Total Protein: 6.6 g/dL (ref 6.0–8.3)

## 2011-11-10 LAB — TSH: TSH: 1.859 u[IU]/mL (ref 0.350–4.500)

## 2011-11-10 LAB — LACTATE DEHYDROGENASE: LDH: 108 U/L (ref 94–250)

## 2011-11-10 LAB — CANCER ANTIGEN 27.29: CA 27.29: 26 U/mL (ref 0–39)

## 2011-11-10 MED ORDER — SODIUM CHLORIDE 0.9 % IJ SOLN
10.0000 mL | INTRAMUSCULAR | Status: DC | PRN
  Administered 2011-11-10: 10 mL via INTRAVENOUS
  Filled 2011-11-10: qty 10

## 2011-11-10 MED ORDER — HEPARIN SOD (PORK) LOCK FLUSH 100 UNIT/ML IV SOLN
500.0000 [IU] | Freq: Once | INTRAVENOUS | Status: AC
  Administered 2011-11-10: 500 [IU] via INTRAVENOUS
  Filled 2011-11-10: qty 5

## 2011-11-10 NOTE — Progress Notes (Signed)
CC:   Currie Paris, M.D. De Blanch, M.D. Luvenia Redden, M.D. Gordy Savers, MD   PROBLEM LIST:  1. Recurrent ovarian cancer with original diagnosis going back to  March 1998. Layney presented at that time with stage III disease  which was characterized by an elevated CA 27.29. Her CA 125 was  normal. [name] underwent debulking surgery, which I believe was  suboptimal. She then received 6 cycles of carboplatin and Taxol.  She underwent second-look surgery on 02/20/1997 and was found to be  disease free. She then received adjuvant topotecan from November  1998 through September 10, 1997. Avanti did well until early part of  2008 when she was having abdominal pain and was found to have a  tumor involving the mid ascending colon. She underwent surgery by  Dr. Cicero Duck on 07/30/2006. The tumor of the colon which  appeared to be growing from the outside into the colon was  resected. There was also a left pelvic nodule that contained  tumor. All of this underwent extensive testing and was found to be  recurrent ovarian cancer rather than primary colon cancer. [name]  then underwent further chemotherapy with carboplatin and Taxol in  combination with Neulasta for 6 cycles from 09/09/2006 through  12/23/2006. She remains disease free. Her most recent PET scan  was carried out on 03/09/2011.  2. Intraductal carcinoma of the left breast diagnosed in 1992, status  post left modified radical mastectomy with all 13 lymph nodes  negative followed by a silicone implant that was later removed  because of leakage on 07/22/2004. This was replaced with a saline  implant.  3. BRCA1 mutation positive with strikingly positive family history for  breast and ovarian cancer.  4. History of bilateral silicone breast implants. Right breast  underwent augmentation in 1981, and following a left modified  radical mastectomy for intraductal carcinoma of the left breast a  silicone implant  was placed in 1992. Both implants were replaced  by saline implants on 07/22/2004. The left silicone implant  apparently had leaked.  5. History of rosacea. 6. Hypothyroidism. 7. Right Port-A-Cath placed on 07/30/2006. Currently being maintained  with heparin flushes every 2 months.      MEDICATIONS:  1. B complex vitamins 1 daily.  2. Calcium carbonate and vitamin D 600/400 one tablet daily.  3. Vitamin D 1000 units daily.  4. Coenzyme Q10 one daily.  5. Flexeril 2.5 mg at bedtime as needed for muscle spasms.  6. Estrace 1 mg daily. Rudean has been on this for many years.  7. Fish oil omega-3 fatty acids 2000 mg daily.  8. Synthroid 88 mcg tablets 1 daily.  9. Melatonin 200 mcg daily.  10.Theragran 1 tablet daily.   HISTORY:  I saw [name] today for followup of her recurrent ovarian cancer with original diagnosis going back to March 1998, currently without evidence of disease.  We also follow [name] for a history of intraductal carcinoma of the left breast dating back to 46.  Terrianna was last seen by [place] on 07/14/2011.  She comes in every 2 months to have her Port-A-Cath flushed with heparin.  We have been seeing her about every 4 months.  She has had no health issues over the past 4 months.  She feels entirely well, is active.  She is now 71 years old. Lives by herself in a house in [place].  Jaylanni likes to read, is generally active.  PHYSICAL EXAM:  She looks well.  Weight today is 161.1 pounds, height 5 feet 7 inches, body surface area 1.86 m2.  Blood pressure 130/65.  Other vital signs are normal.  There is no scleral icterus.  Mouth and pharynx are benign.  There is no peripheral adenopathy palpable.  Heart and lungs are normal.  There is a right-sided Port-A-Cath that was flushed with heparin today.  Left breast was reconstructed after modified radical mastectomy and is slightly larger than the right breast which has been augmented.  There are bilateral saline  implants.  Abdomen is benign, nontender with no organomegaly or masses palpable.  I thought I could feel the liver edge descend just below the right costal margin with inspiration.  No tenderness, abdominal masses or ascites. Extremities, no peripheral edema or clubbing.  No obvious lymphedema of either arm.  Mckinzy has sun related damage to her skin.  Neurologic exam is normal.  LABORATORY DATA:  Today, white count 5.9, ANC 3.7, hemoglobin 15.0, hematocrit 43.7, platelets 186,000.  Chemistries:  CA27.29 and TSH are pending.  Chemistries from 07/14/2011 were normal except for an ALT of 39 with normal being 0-35. CA27.29 was 24 with normal being 0-39.  TSH on 02/19 was 3.150 which is normal.  IMAGING STUDIES:  1. The PET scan from 03/26/2010 showed no specific features to suggest  residual or recurrent tumor.  2. Screening right mammogram from 02/25/2011 was negative.  3. PET scan from 03/09/2011 showed no evidence for recurrent breast or  ovarian cancer.   IMPRESSION AND PLAN:  Zorah continues to do extraordinarily well with no evidence for recurrence of any of her cancers.  Her ovarian cancer now goes back  beyond 15 years.  As noted she did have recurrence of tumor in February 2008.  Sherita's Port-A-Cath was flushed with heparin today.  We will maintain the Port-A-Cath with heparin flushes every 2 months.  We will plan to see Rameen again in 4 months at which time we will do CBC, chemistries and CA27.29.  We are also going to set up a PET scan to be carried out about 1 week prior to Fusako's appointment with me.  She will also be due at that time for a screening mammogram of the right breast.  Miette sees Dr. Lodema Hong for a gyn exam on a yearly  basis usually around the time when she has her mammogram which will be in October.    ______________________________ Samul Dada, M.D. DSM/MEDQ  D:  11/10/2011  T:  11/10/2011  Job:  [id number]

## 2011-11-10 NOTE — Telephone Encounter (Signed)
Gave pt appt for August and October 2013 lab PET  Scan and md

## 2011-11-10 NOTE — Progress Notes (Signed)
This office note has been dictated.  #[id number]

## 2011-11-11 ENCOUNTER — Encounter: Payer: Self-pay | Admitting: Nurse Practitioner

## 2011-11-11 NOTE — Progress Notes (Signed)
TSH sent to Dr. Vernell Barrier via [facility] inbox.

## 2011-12-11 DIAGNOSIS — L570 Actinic keratosis: Secondary | ICD-10-CM | POA: Diagnosis not present

## 2011-12-11 DIAGNOSIS — L578 Other skin changes due to chronic exposure to nonionizing radiation: Secondary | ICD-10-CM | POA: Diagnosis not present

## 2012-01-11 ENCOUNTER — Ambulatory Visit (HOSPITAL_BASED_OUTPATIENT_CLINIC_OR_DEPARTMENT_OTHER): Payer: Medicare Other

## 2012-01-11 VITALS — BP 120/67 | HR 72 | Temp 97.3°F | Resp 20

## 2012-01-11 DIAGNOSIS — Z452 Encounter for adjustment and management of vascular access device: Secondary | ICD-10-CM

## 2012-01-11 DIAGNOSIS — C569 Malignant neoplasm of unspecified ovary: Secondary | ICD-10-CM

## 2012-01-11 MED ORDER — SODIUM CHLORIDE 0.9 % IJ SOLN
10.0000 mL | INTRAMUSCULAR | Status: DC | PRN
  Administered 2012-01-11: 10 mL via INTRAVENOUS
  Filled 2012-01-11: qty 10

## 2012-01-11 MED ORDER — HEPARIN SOD (PORK) LOCK FLUSH 100 UNIT/ML IV SOLN
500.0000 [IU] | Freq: Once | INTRAVENOUS | Status: AC
  Administered 2012-01-11: 500 [IU] via INTRAVENOUS
  Filled 2012-01-11: qty 5

## 2012-01-27 ENCOUNTER — Other Ambulatory Visit: Payer: Self-pay | Admitting: Oncology

## 2012-01-27 DIAGNOSIS — Z9012 Acquired absence of left breast and nipple: Secondary | ICD-10-CM

## 2012-01-27 DIAGNOSIS — Z1231 Encounter for screening mammogram for malignant neoplasm of breast: Secondary | ICD-10-CM

## 2012-01-27 DIAGNOSIS — Z9882 Breast implant status: Secondary | ICD-10-CM

## 2012-02-18 ENCOUNTER — Other Ambulatory Visit: Payer: Self-pay | Admitting: Internal Medicine

## 2012-02-18 MED ORDER — CYCLOBENZAPRINE HCL 5 MG PO TABS: 5.0000 mg | ORAL_TABLET | Freq: Three times a day (TID) | ORAL | Status: DC | PRN

## 2012-02-18 NOTE — Telephone Encounter (Signed)
Pt needs refill on generic flexeril call into [facility] [phone]

## 2012-02-18 NOTE — Telephone Encounter (Signed)
Done

## 2012-02-24 DIAGNOSIS — H2510 Age-related nuclear cataract, unspecified eye: Secondary | ICD-10-CM | POA: Diagnosis not present

## 2012-02-24 DIAGNOSIS — H52209 Unspecified astigmatism, unspecified eye: Secondary | ICD-10-CM | POA: Diagnosis not present

## 2012-02-24 DIAGNOSIS — H5210 Myopia, unspecified eye: Secondary | ICD-10-CM | POA: Diagnosis not present

## 2012-02-24 DIAGNOSIS — H25019 Cortical age-related cataract, unspecified eye: Secondary | ICD-10-CM | POA: Diagnosis not present

## 2012-02-26 ENCOUNTER — Ambulatory Visit
Admission: RE | Admit: 2012-02-26 | Discharge: 2012-02-26 | Disposition: A | Discharge status: Home / Self Care | Payer: Medicare Other | Source: Ambulatory Visit | Attending: Oncology | Admitting: Oncology

## 2012-02-26 DIAGNOSIS — Z1231 Encounter for screening mammogram for malignant neoplasm of breast: Secondary | ICD-10-CM

## 2012-02-26 DIAGNOSIS — Z9012 Acquired absence of left breast and nipple: Secondary | ICD-10-CM

## 2012-02-26 DIAGNOSIS — Z9882 Breast implant status: Secondary | ICD-10-CM

## 2012-03-02 ENCOUNTER — Ambulatory Visit (HOSPITAL_COMMUNITY)
Admission: RE | Admit: 2012-03-02 | Discharge: 2012-03-02 | Disposition: A | Discharge status: Home / Self Care | Payer: Medicare Other | Source: Ambulatory Visit | Attending: Oncology | Admitting: Oncology

## 2012-03-02 ENCOUNTER — Encounter (HOSPITAL_COMMUNITY): Payer: Self-pay

## 2012-03-02 DIAGNOSIS — J984 Other disorders of lung: Secondary | ICD-10-CM | POA: Insufficient documentation

## 2012-03-02 DIAGNOSIS — Z8543 Personal history of malignant neoplasm of ovary: Secondary | ICD-10-CM

## 2012-03-02 DIAGNOSIS — C569 Malignant neoplasm of unspecified ovary: Secondary | ICD-10-CM | POA: Insufficient documentation

## 2012-03-02 DIAGNOSIS — Z853 Personal history of malignant neoplasm of breast: Secondary | ICD-10-CM

## 2012-03-02 DIAGNOSIS — C50919 Malignant neoplasm of unspecified site of unspecified female breast: Secondary | ICD-10-CM | POA: Insufficient documentation

## 2012-03-02 MED ORDER — FLUDEOXYGLUCOSE F - 18 (FDG) INJECTION
17.8000 | Freq: Once | INTRAVENOUS | Status: AC | PRN
  Administered 2012-03-02: 17.8 via INTRAVENOUS

## 2012-03-03 NOTE — Progress Notes (Signed)
Quick Note:  Please notify patient and call/fax these results to patient's doctors. ______ 

## 2012-03-07 ENCOUNTER — Telehealth: Payer: Self-pay | Admitting: Medical Oncology

## 2012-03-07 NOTE — Telephone Encounter (Signed)
I called pt with PET scan results per Dr.Murinson.

## 2012-03-08 DIAGNOSIS — Z09 Encounter for follow-up examination after completed treatment for conditions other than malignant neoplasm: Secondary | ICD-10-CM | POA: Diagnosis not present

## 2012-03-08 DIAGNOSIS — Z85038 Personal history of other malignant neoplasm of large intestine: Secondary | ICD-10-CM | POA: Diagnosis not present

## 2012-03-08 DIAGNOSIS — Z980 Intestinal bypass and anastomosis status: Secondary | ICD-10-CM | POA: Diagnosis not present

## 2012-03-08 DIAGNOSIS — Z8543 Personal history of malignant neoplasm of ovary: Secondary | ICD-10-CM | POA: Diagnosis not present

## 2012-03-10 ENCOUNTER — Other Ambulatory Visit (HOSPITAL_BASED_OUTPATIENT_CLINIC_OR_DEPARTMENT_OTHER): Payer: Medicare Other

## 2012-03-10 ENCOUNTER — Encounter: Payer: Self-pay | Admitting: Oncology

## 2012-03-10 ENCOUNTER — Ambulatory Visit (HOSPITAL_BASED_OUTPATIENT_CLINIC_OR_DEPARTMENT_OTHER): Payer: Medicare Other | Admitting: Oncology

## 2012-03-10 VITALS — BP 107/62 | HR 68 | Temp 97.7°F | Resp 20 | Ht 67.0 in | Wt 161.1 lb

## 2012-03-10 DIAGNOSIS — Z1501 Genetic susceptibility to malignant neoplasm of breast: Secondary | ICD-10-CM

## 2012-03-10 DIAGNOSIS — C50919 Malignant neoplasm of unspecified site of unspecified female breast: Secondary | ICD-10-CM | POA: Diagnosis not present

## 2012-03-10 DIAGNOSIS — Z8543 Personal history of malignant neoplasm of ovary: Secondary | ICD-10-CM

## 2012-03-10 DIAGNOSIS — Z853 Personal history of malignant neoplasm of breast: Secondary | ICD-10-CM

## 2012-03-10 DIAGNOSIS — Z9010 Acquired absence of unspecified breast and nipple: Secondary | ICD-10-CM | POA: Diagnosis not present

## 2012-03-10 LAB — CBC WITH DIFFERENTIAL/PLATELET
Basophils Absolute: 0 10*3/uL (ref 0.0–0.1)
EOS%: 3.2 % (ref 0.0–7.0)
HCT: 41.3 % (ref 34.8–46.6)
HGB: 14 g/dL (ref 11.6–15.9)
LYMPH%: 22.3 % (ref 14.0–49.7)
MCH: 32 pg (ref 25.1–34.0)
MCHC: 33.9 g/dL (ref 31.5–36.0)
MCV: 94.4 fL (ref 79.5–101.0)
MONO%: 8.4 % (ref 0.0–14.0)
NEUT%: 65.7 % (ref 38.4–76.8)

## 2012-03-10 LAB — COMPREHENSIVE METABOLIC PANEL
Albumin: 3.3 g/dL — ABNORMAL LOW (ref 3.5–5.2)
Alkaline Phosphatase: 83 U/L (ref 39–117)
BUN: 11 mg/dL (ref 6–23)
Creatinine, Ser: 0.72 mg/dL (ref 0.50–1.10)
Glucose, Bld: 93 mg/dL (ref 70–99)
Total Bilirubin: 0.2 mg/dL — ABNORMAL LOW (ref 0.3–1.2)

## 2012-03-10 MED ORDER — HEPARIN SOD (PORK) LOCK FLUSH 100 UNIT/ML IV SOLN
500.0000 [IU] | Freq: Once | INTRAVENOUS | Status: AC
  Administered 2012-03-10: 500 [IU] via INTRAVENOUS
  Filled 2012-03-10: qty 5

## 2012-03-10 MED ORDER — SODIUM CHLORIDE 0.9 % IJ SOLN
10.0000 mL | INTRAMUSCULAR | Status: DC | PRN
  Administered 2012-03-10: 10 mL via INTRAVENOUS
  Filled 2012-03-10: qty 10

## 2012-03-10 NOTE — Patient Instructions (Signed)
We need to flush your port with heparin every 2 months.

## 2012-03-10 NOTE — Progress Notes (Signed)
This office note has been dictated.  #[id number]

## 2012-03-11 ENCOUNTER — Telehealth: Payer: Self-pay | Admitting: Internal Medicine

## 2012-03-11 NOTE — Telephone Encounter (Signed)
called pt with appts   aom °

## 2012-03-11 NOTE — Progress Notes (Signed)
**Note Jessica-Identified via Obfuscation** CC:   Jessica [name], M.D. Jessica [name], M.D. Jessica [name], M.D. Jessica Savers, MD Jessica [name]. Jessica [name], M.D.  PROBLEM LIST:  1. Recurrent ovarian cancer with original diagnosis going back to  March 1998. [name] presented at that time with stage III disease  which was characterized by an elevated CA 27.29. Her CA 125 was  normal. Jessica [name] underwent debulking surgery, which I believe was  suboptimal. She then received 6 cycles of carboplatin and Taxol.  She underwent second-look surgery on 02/20/1997 and was found to be  disease free. She then received adjuvant topotecan from November  1998 through September 10, 1997. [name] did well until early part of  2008 when she was having abdominal pain and was found to have a  tumor involving the mid ascending colon. She underwent surgery by  Dr. Cicero Duck on 07/30/2006. The tumor of the colon which  appeared to be growing from the outside into the colon was  resected. There was also a left pelvic nodule that contained  tumor. [name] of this underwent extensive testing and was found to be  recurrent ovarian cancer rather than primary colon cancer. Jessica [name]  then underwent further chemotherapy with carboplatin and Taxol in  combination with Neulasta for 6 cycles from 09/09/2006 through  12/23/2006. She remains disease free. Jessica [name]'s most recent PET scan  was carried out on 03/02/2012 and was negative.    2. Intraductal carcinoma of the left breast diagnosed in 1992, status  post left modified radical mastectomy with [name] 13 lymph nodes  negative followed by a silicone implant that was later removed  because of leakage on 07/22/2004. This was replaced with a saline  implant.  3. BRCA1 mutation positive with strikingly positive family history for  breast and ovarian cancer.  4. History of bilateral silicone breast implants. Right breast  underwent augmentation in 1981, and following a left modified  radical mastectomy for intraductal  carcinoma of the left breast a  silicone implant was placed in 1992. Both implants were replaced  by saline implants on 07/22/2004. The left silicone implant  apparently had leaked.  5. History of rosacea.  6. Hypothyroidism.  7. Right Port-A-Cath placed on 07/30/2006. Currently being maintained  with heparin flushes every 2 months.    MEDICATIONS:  1. B complex vitamins 1 daily.  2. Calcium carbonate and vitamin D 600/400 one tablet daily.  3. Vitamin D 1000 units daily.  4. Coenzyme Q10 one daily.  5. Flexeril 2.5 mg at bedtime as needed for muscle spasms.  6. Estrace 1 mg daily. Amyre has been on this for many years.  7. Fish oil omega-3 fatty acids 2000 mg daily.  8. Synthroid 88 mcg tablets 1 daily.  9. Melatonin 1 mg at bedtime as needed.  10.Theragran 1 tablet daily.   SMOKING HISTORY:  [name] is a former smoker.   HISTORY:  Jessica [name] was seen today for follow-up of her recurrent ovarian cancer with original diagnosis going back to March 1998, currently without evidence of disease after suffering a recurrence back in March 2008.  We also follow Jessica [name] for a history of intraductal carcinoma of the left breast dating back to 65.  [name] was last seen by [place] on 11/10/2011.  We have maintained her Port-A- Cath with heparin flushes every 2 months.  [name] has felt well without any health issues over the past 4 months.  She has undergone mammography of her right breast, a PET scan, and a colonoscopy within the  last couple of weeks, [name] of which are negative.  [name] tells me that her significant other, Jessica [name], had to have a pacemaker because of bradycardia.  PHYSICAL EXAM:  General:  [name] continues to look well.  Weight is 161.1 pounds, height 5 feet 7 inches, body surface area 1.86 sq m.  Vital Signs:  Blood pressure 107/62.  Other vital signs are normal.  HEENT: There is no scleral icterus.  Mouth and pharynx are benign.  There is no peripheral adenopathy palpable.   Heart and Lungs:  Normal.  There is a right-sided Port-A-Cath that was flushed with heparin today.  Breasts: Not examined.  [name] will be seeing her gynecologist Dr. Greta [name] within the next couple of weeks.  Abdomen:  Benign with no organomegaly or masses palpable.  Extremities:  No peripheral edema.  There is a lot of sun-related damage involving the skin, particularly of the arms and legs.  Neurologic:  Normal.  LABORATORY DATA:  Today, white count 5.6, ANC 3.7, hemoglobin 14.0, hematocrit 41.3, platelets 178,000.  Chemistries and CA27.29 are pending.  Chemistries from 11/10/2011 were normal.  CA27.29 was 26 on 11/10/2011.  TSH on 11/10/2011 was 1.859.  TSH on 07/14/2011 was 3.150.   IMAGING STUDIES:  1. The PET scan from 03/26/2010 showed no specific features to suggest  residual or recurrent tumor.  2. Screening right mammogram from 02/25/2011 was negative.  3. PET scan from 03/09/2011 showed no evidence for recurrent breast or  ovarian cancer. 4. Right digital screening mammogram on 02/26/2012 was negative. 5. PET scan from 03/02/2012 showed no evidence for local recurrence or metastatic disease.   PROCEDURES: 1. Colonoscopy was carried out by Dr. Dorena Cookey on 03/08/2012.  We will obtain the official report.  Prior colonoscopy was on 07/05/2006.   IMPRESSION AND PLAN:  [name] continues to do well, now 5-1/2 years from the time of her recurrent ovarian cancer that presented with isolated colonic obstruction.  The original tumor goes back 15-1/2 years to March 1998.  [name]'s Port-A-Cath was flushed with heparin.  We will maintain the Port- A-Cath with heparin flushes every 2 months.  We will plan to see [name] again in 6 months, which will be mid April 2014, at which time we will check CBC, chemistries, and CA27.29.  As stated, [name] has an appointment see Dr. Lodema [name] within the next couple of weeks.    ______________________________ Samul Dada, M.D. DSM/MEDQ   D:  03/10/2012  T:  03/11/2012  Job:  [id number]

## 2012-03-18 DIAGNOSIS — L570 Actinic keratosis: Secondary | ICD-10-CM | POA: Diagnosis not present

## 2012-03-18 DIAGNOSIS — L819 Disorder of pigmentation, unspecified: Secondary | ICD-10-CM | POA: Diagnosis not present

## 2012-03-18 DIAGNOSIS — L578 Other skin changes due to chronic exposure to nonionizing radiation: Secondary | ICD-10-CM | POA: Diagnosis not present

## 2012-03-18 DIAGNOSIS — L723 Sebaceous cyst: Secondary | ICD-10-CM | POA: Diagnosis not present

## 2012-03-21 ENCOUNTER — Other Ambulatory Visit: Payer: Self-pay | Admitting: Gynecology

## 2012-03-21 DIAGNOSIS — E349 Endocrine disorder, unspecified: Secondary | ICD-10-CM | POA: Diagnosis not present

## 2012-03-21 DIAGNOSIS — Z853 Personal history of malignant neoplasm of breast: Secondary | ICD-10-CM | POA: Diagnosis not present

## 2012-03-21 DIAGNOSIS — Z8540 Personal history of malignant neoplasm of unspecified female genital organ: Secondary | ICD-10-CM | POA: Diagnosis not present

## 2012-03-21 DIAGNOSIS — Z01419 Encounter for gynecological examination (general) (routine) without abnormal findings: Secondary | ICD-10-CM | POA: Diagnosis not present

## 2012-03-21 DIAGNOSIS — Z1212 Encounter for screening for malignant neoplasm of rectum: Secondary | ICD-10-CM | POA: Diagnosis not present

## 2012-05-03 ENCOUNTER — Telehealth: Payer: Self-pay | Admitting: Internal Medicine

## 2012-05-03 NOTE — Telephone Encounter (Signed)
Pt refused advise for appt or UC today. Appt for tomorrow, per pt - FYI

## 2012-05-03 NOTE — Telephone Encounter (Signed)
Patient Information:  Caller Name: Sabriyah  Phone: [phone]  Patient: [name], [name]  Gender: Female  DOB: 1941-04-03  Age: 71 Years  PCP: Eleonore Chiquito [facility])   Symptoms  Reason For Call & Symptoms: balance/dizziness  Reviewed Health History In EMR: Yes  Reviewed Medications In EMR: Yes  Reviewed Allergies In EMR: Yes  Reviewed Surgeries / Procedures: Yes  Date of Onset of Symptoms: 04/19/2012  Guideline(s) Used:  Dizziness  Disposition Per Guideline:   Go to Office Now  Reason For Disposition Reached:   Lightheadedness (dizziness) present now, after 2 hours of rest and fluids  Advice Given:  N/A  Office Follow Up:  Does the office need to follow up with this patient?: No  Instructions For The Office: N/A  Patient Refused Recommendation:  Patient Refused Appt, Patient Requests Appt At Later Date  Refuses UC; per request, appt scheduled 0915 05/04/12 with Dr. Kirtland Bouchard.  krs/can  RN Note:  c/o dizziness, esp when arising AM, x 2 weeks, but notes that 05/03/12 it has been occurring throughout the day.  Per protocol, advised appt in office within 4 hours; advised UC due to no more appts available.  Patient refuses UC; states wants appt in AM 05/04/12.  Per patient request, appt scheduled 05/04/12 0915 with Dr. Amador Cunas.  krs/can

## 2012-05-04 ENCOUNTER — Encounter: Payer: Self-pay | Admitting: Internal Medicine

## 2012-05-04 ENCOUNTER — Ambulatory Visit (INDEPENDENT_AMBULATORY_CARE_PROVIDER_SITE_OTHER): Payer: Medicare Other | Admitting: Internal Medicine

## 2012-05-04 VITALS — BP 120/62 | HR 76 | Temp 97.5°F | Resp 18 | Wt 162.0 lb

## 2012-05-04 DIAGNOSIS — R42 Dizziness and giddiness: Secondary | ICD-10-CM

## 2012-05-04 DIAGNOSIS — Z853 Personal history of malignant neoplasm of breast: Secondary | ICD-10-CM

## 2012-05-04 DIAGNOSIS — Z8543 Personal history of malignant neoplasm of ovary: Secondary | ICD-10-CM

## 2012-05-04 NOTE — Progress Notes (Signed)
Subjective:    Patient ID: [name], female    DOB: Jul 31, 1940, 71 y.o.   MRN: [id number]  HPI  71 year old patient who is followed closely by oncology do to a history of breast and ovarian cancer. The past several days she has had some brief intermittent vertigo associated with movement. Yesterday she states that she was vertiginous throughout the day. Today she feels well.  Past Medical History  Diagnosis Date  . Ovarian cancer     March 1998  . DCIS (ductal carcinoma in situ) of breast     History   Social History  . Marital Status: Divorced    Spouse Name: N/A    Number of Children: N/A  . Years of Education: N/A   Occupational History  . Not on file.   Social History Main Topics  . Smoking status: Former Smoker    Quit date: 05/26/1991  . Smokeless tobacco: Never Used  . Alcohol Use: Not on file  . Drug Use: Not on file  . Sexually Active: Not on file   Other Topics Concern  . Not on file   Social History Narrative   March 1998- history of stage III ovarian cancer; status post suboptimal debulking; status post 6 cycles of Taxoil and carboplatin with complete remission documented by second look surgery September 1998.Recurrent ovarian cancer, status post 6 cycles carbo and Taxol, April 1998 until July [id number], left breast intraductal carcinoma (DCIS)Status post left modified radial mastectomy and with silicon implant13 of 13 lymph modes negative; positive for BCRA1 geneRecurrent ovarian carinoma, February 2008Admit Feb. 2009 partial small bowel obstruction treated medically    Past Surgical History  Procedure Date  . Biopsy thyroid     for benign disease  . Colonscopy 2004    2/08  . Left ankle fracture     status post surgery, March 2010  . Cholecystectomy     Family History  Problem Relation Age of Onset  . Cancer Mother     Corky Mull, historyof melanoma   . Aneurysm Father     abdominal aortic  . Heart disease Father   . Diverticulitis Brother      Allergies  Allergen Reactions  . Codeine Sulfate     REACTION: unspecified  . Promethazine Hcl     REACTION: unspecified    Current Outpatient Prescriptions on File Prior to Visit  Medication Sig Dispense Refill  . b complex vitamins tablet Take 1 tablet by mouth daily.        . Calcium Carbonate-Vitamin D (CALTRATE 600+D) 600-400 MG-UNIT per chew tablet Chew 1 tablet by mouth daily.        . Cholecalciferol (VITAMIN D) 1000 UNITS capsule Take 1,000 Units by mouth daily.        . Coenzyme Q10 (CO Q 10) 10 MG CAPS Take by mouth daily.        . cyclobenzaprine (FLEXERIL) 5 MG tablet Take 1 tablet (5 mg total) by mouth every 8 (eight) hours as needed for muscle spasms.  30 tablet  1  . estradiol (ESTRACE) 1 MG tablet Take 1 mg by mouth daily.        . fish oil-omega-3 fatty acids 1000 MG capsule Take 2 g by mouth daily.        Marland Kitchen levothyroxine (SYNTHROID) 88 MCG tablet Take 1 tablet (88 mcg total) by mouth daily.  90 tablet  6  . Melatonin 1 MG CAPS Take by mouth at bedtime as needed.      Marland Kitchen  multivitamin (THERAGRAN) per tablet Take 1 tablet by mouth daily.          BP 120/62  Pulse 76  Temp 97.5 F (36.4 C) (Oral)  Resp 18  Wt 162 lb (73.483 kg)  SpO2 96%       Review of Systems  Constitutional: Negative.   HENT: Negative for hearing loss, congestion, sore throat, rhinorrhea, dental problem, sinus pressure and tinnitus.   Eyes: Negative for pain, discharge and visual disturbance.  Respiratory: Negative for cough and shortness of breath.   Cardiovascular: Negative for chest pain, palpitations and leg swelling.  Gastrointestinal: Negative for nausea, vomiting, abdominal pain, diarrhea, constipation, blood in stool and abdominal distention.  Genitourinary: Negative for dysuria, urgency, frequency, hematuria, flank pain, vaginal bleeding, vaginal discharge, difficulty urinating, vaginal pain and pelvic pain.  Musculoskeletal: Negative for joint swelling, arthralgias and gait  problem.  Skin: Negative for rash.  Neurological: Positive for light-headedness. Negative for dizziness, syncope, speech difficulty, weakness, numbness and headaches.  Hematological: Negative for adenopathy.  Psychiatric/Behavioral: Negative for behavioral problems, dysphoric mood and agitation. The patient is not nervous/anxious.        Objective:   Physical Exam  Constitutional: She is oriented to person, place, and time. She appears well-developed and well-nourished.  HENT:  Head: Normocephalic.  Right Ear: External ear normal.  Left Ear: External ear normal.  Mouth/Throat: Oropharynx is clear and moist.  Eyes: Conjunctivae normal and EOM are normal. Pupils are equal, round, and reactive to light.  Neck: Normal range of motion. Neck supple. No thyromegaly present.  Cardiovascular: Normal rate, regular rhythm, normal heart sounds and intact distal pulses.   Pulmonary/Chest: Effort normal and breath sounds normal.  Abdominal: Soft. Bowel sounds are normal. She exhibits no mass. There is no tenderness.  Musculoskeletal: Normal range of motion.  Lymphadenopathy:    She has no cervical adenopathy.  Neurological: She is alert and oriented to person, place, and time. She has normal reflexes. She displays normal reflexes. No cranial nerve deficit. She exhibits normal muscle tone. Coordination normal.       No pathological nystagmus  Extraocular muscles full  Finger to nose testing normal Romberg normal  Skin: Skin is warm and dry. No rash noted.  Psychiatric: She has a normal mood and affect. Her behavior is normal.          Assessment & Plan:   Benign positional vertigo.  Was given information concerning self treatment of benign positional vertigo. Presently she is asymptomatic and perhaps this episode has resolved. Will call if unimproved  Follow up oncology

## 2012-05-04 NOTE — Patient Instructions (Signed)
Benign Positional Vertigo  Vertigo means you feel like you or your surroundings are moving when they are not. Benign positional vertigo is the most common form of vertigo. Benign means that the cause of your condition is not serious. Benign positional vertigo is more common in older adults.  CAUSES   Benign positional vertigo is the result of an upset in the labyrinth system. This is an area in the middle ear that helps control your balance. This may be caused by a viral infection, head injury, or repetitive motion. However, often no specific cause is found.  SYMPTOMS   Symptoms of benign positional vertigo occur when you move your head or eyes in different directions. Some of the symptoms may include:  · Loss of balance and falls.  · Vomiting.  · Blurred vision.  · Dizziness.  · Nausea.  · Involuntary eye movements (nystagmus).  DIAGNOSIS   Benign positional vertigo is usually diagnosed by physical exam. If the specific cause of your benign positional vertigo is unknown, your caregiver may perform imaging tests, such as magnetic resonance imaging (MRI) or computed tomography (CT).  TREATMENT   Your caregiver may recommend movements or procedures to correct the benign positional vertigo. Medicines such as meclizine, benzodiazepines, and medicines for nausea may be used to treat your symptoms. In rare cases, if your symptoms are caused by certain conditions that affect the inner ear, you may need surgery.  HOME CARE INSTRUCTIONS   · Follow your caregiver's instructions.  · Move slowly. Do not make sudden body or head movements.  · Avoid driving.  · Avoid operating heavy machinery.  · Avoid performing any tasks that would be dangerous to you or others during a vertigo episode.  · Drink enough fluids to keep your urine clear or pale yellow.  SEEK IMMEDIATE MEDICAL CARE IF:   · You develop problems with walking, weakness, numbness, or using your arms, hands, or legs.  · You have difficulty speaking.  · You develop  severe headaches.  · Your nausea or vomiting continues or gets worse.  · You develop visual changes.  · Your family or friends notice any behavioral changes.  · Your condition gets worse.  · You have a fever.  · You develop a stiff neck or sensitivity to light.  MAKE SURE YOU:   · Understand these instructions.  · Will watch your condition.  · Will get help right away if you are not doing well or get worse.  Document Released: 02/16/2006 Document Revised: 08/03/2011 Document Reviewed: 01/29/2011  [employer]® Patient Information ©2013 [employer].

## 2012-05-10 ENCOUNTER — Ambulatory Visit (HOSPITAL_BASED_OUTPATIENT_CLINIC_OR_DEPARTMENT_OTHER): Payer: Medicare Other

## 2012-05-10 VITALS — BP 127/56 | HR 59 | Temp 98.5°F

## 2012-05-10 DIAGNOSIS — Z452 Encounter for adjustment and management of vascular access device: Secondary | ICD-10-CM

## 2012-05-10 DIAGNOSIS — C569 Malignant neoplasm of unspecified ovary: Secondary | ICD-10-CM

## 2012-05-10 MED ORDER — SODIUM CHLORIDE 0.9 % IJ SOLN
10.0000 mL | INTRAMUSCULAR | Status: DC | PRN
  Administered 2012-05-10: 10 mL via INTRAVENOUS
  Filled 2012-05-10: qty 10

## 2012-05-10 MED ORDER — HEPARIN SOD (PORK) LOCK FLUSH 100 UNIT/ML IV SOLN
500.0000 [IU] | Freq: Once | INTRAVENOUS | Status: AC
  Administered 2012-05-10: 500 [IU] via INTRAVENOUS
  Filled 2012-05-10: qty 5

## 2012-06-24 DIAGNOSIS — D692 Other nonthrombocytopenic purpura: Secondary | ICD-10-CM | POA: Diagnosis not present

## 2012-06-24 DIAGNOSIS — L570 Actinic keratosis: Secondary | ICD-10-CM | POA: Diagnosis not present

## 2012-06-24 DIAGNOSIS — L578 Other skin changes due to chronic exposure to nonionizing radiation: Secondary | ICD-10-CM | POA: Diagnosis not present

## 2012-06-24 DIAGNOSIS — L819 Disorder of pigmentation, unspecified: Secondary | ICD-10-CM | POA: Diagnosis not present

## 2012-06-27 ENCOUNTER — Emergency Department (HOSPITAL_BASED_OUTPATIENT_CLINIC_OR_DEPARTMENT_OTHER): Payer: Medicare Other

## 2012-06-27 ENCOUNTER — Encounter (HOSPITAL_BASED_OUTPATIENT_CLINIC_OR_DEPARTMENT_OTHER): Payer: Self-pay | Admitting: *Deleted

## 2012-06-27 ENCOUNTER — Emergency Department (HOSPITAL_BASED_OUTPATIENT_CLINIC_OR_DEPARTMENT_OTHER)
Admission: EM | Admit: 2012-06-27 | Discharge: 2012-06-27 | Disposition: A | Discharge status: Home / Self Care | Payer: Medicare Other | Attending: Emergency Medicine | Admitting: Emergency Medicine

## 2012-06-27 DIAGNOSIS — Z79899 Other long term (current) drug therapy: Secondary | ICD-10-CM | POA: Diagnosis not present

## 2012-06-27 DIAGNOSIS — Z87891 Personal history of nicotine dependence: Secondary | ICD-10-CM | POA: Diagnosis not present

## 2012-06-27 DIAGNOSIS — Z8781 Personal history of (healed) traumatic fracture: Secondary | ICD-10-CM | POA: Diagnosis not present

## 2012-06-27 DIAGNOSIS — S0990XA Unspecified injury of head, initial encounter: Secondary | ICD-10-CM | POA: Diagnosis not present

## 2012-06-27 DIAGNOSIS — T07XXXA Unspecified multiple injuries, initial encounter: Secondary | ICD-10-CM

## 2012-06-27 DIAGNOSIS — Z23 Encounter for immunization: Secondary | ICD-10-CM | POA: Insufficient documentation

## 2012-06-27 DIAGNOSIS — M79609 Pain in unspecified limb: Secondary | ICD-10-CM | POA: Diagnosis not present

## 2012-06-27 DIAGNOSIS — S59909A Unspecified injury of unspecified elbow, initial encounter: Secondary | ICD-10-CM | POA: Diagnosis not present

## 2012-06-27 DIAGNOSIS — T148XXA Other injury of unspecified body region, initial encounter: Secondary | ICD-10-CM

## 2012-06-27 DIAGNOSIS — Z8543 Personal history of malignant neoplasm of ovary: Secondary | ICD-10-CM | POA: Insufficient documentation

## 2012-06-27 DIAGNOSIS — M25569 Pain in unspecified knee: Secondary | ICD-10-CM | POA: Diagnosis not present

## 2012-06-27 DIAGNOSIS — S99929A Unspecified injury of unspecified foot, initial encounter: Secondary | ICD-10-CM | POA: Insufficient documentation

## 2012-06-27 DIAGNOSIS — W108XXA Fall (on) (from) other stairs and steps, initial encounter: Secondary | ICD-10-CM | POA: Insufficient documentation

## 2012-06-27 DIAGNOSIS — S6990XA Unspecified injury of unspecified wrist, hand and finger(s), initial encounter: Secondary | ICD-10-CM | POA: Insufficient documentation

## 2012-06-27 DIAGNOSIS — S199XXA Unspecified injury of neck, initial encounter: Secondary | ICD-10-CM | POA: Diagnosis not present

## 2012-06-27 DIAGNOSIS — S59919A Unspecified injury of unspecified forearm, initial encounter: Secondary | ICD-10-CM | POA: Insufficient documentation

## 2012-06-27 DIAGNOSIS — IMO0002 Reserved for concepts with insufficient information to code with codable children: Secondary | ICD-10-CM | POA: Diagnosis not present

## 2012-06-27 DIAGNOSIS — Z853 Personal history of malignant neoplasm of breast: Secondary | ICD-10-CM | POA: Diagnosis not present

## 2012-06-27 DIAGNOSIS — Y9301 Activity, walking, marching and hiking: Secondary | ICD-10-CM | POA: Insufficient documentation

## 2012-06-27 DIAGNOSIS — S0993XA Unspecified injury of face, initial encounter: Secondary | ICD-10-CM | POA: Diagnosis not present

## 2012-06-27 DIAGNOSIS — S8990XA Unspecified injury of unspecified lower leg, initial encounter: Secondary | ICD-10-CM | POA: Diagnosis not present

## 2012-06-27 DIAGNOSIS — R51 Headache: Secondary | ICD-10-CM | POA: Diagnosis not present

## 2012-06-27 DIAGNOSIS — S298XXA Other specified injuries of thorax, initial encounter: Secondary | ICD-10-CM | POA: Diagnosis not present

## 2012-06-27 DIAGNOSIS — S8010XA Contusion of unspecified lower leg, initial encounter: Secondary | ICD-10-CM | POA: Diagnosis not present

## 2012-06-27 DIAGNOSIS — Y9289 Other specified places as the place of occurrence of the external cause: Secondary | ICD-10-CM | POA: Insufficient documentation

## 2012-06-27 DIAGNOSIS — W010XXA Fall on same level from slipping, tripping and stumbling without subsequent striking against object, initial encounter: Secondary | ICD-10-CM | POA: Insufficient documentation

## 2012-06-27 MED ORDER — TETANUS-DIPHTH-ACELL PERTUSSIS 5-2.5-18.5 LF-MCG/0.5 IM SUSP
0.5000 mL | Freq: Once | INTRAMUSCULAR | Status: AC
  Administered 2012-06-27: 0.5 mL via INTRAMUSCULAR
  Filled 2012-06-27: qty 0.5

## 2012-06-27 MED ORDER — OXYCODONE-ACETAMINOPHEN 5-325 MG PO TABS
1.0000 | ORAL_TABLET | Freq: Once | ORAL | Status: AC
  Administered 2012-06-27: 1 via ORAL
  Filled 2012-06-27 (×2): qty 1

## 2012-06-27 MED ORDER — OXYCODONE-ACETAMINOPHEN 5-325 MG PO TABS: 1.0000 | ORAL_TABLET | Freq: Four times a day (QID) | ORAL | Status: DC | PRN

## 2012-06-27 NOTE — ED Notes (Signed)
Pt reports that she fell down 8 steps about 1 hour ago, head first. She c/o pain in  Left leg, right lower leg, and the top of her head. Pt arrives with ice packs in place in areas of pain

## 2012-06-27 NOTE — ED Notes (Signed)
I completed wound care to right forearm, skin tear with non-adherent dressing and kerlix wrap for comfort. I used shear bandages on  small scrapes of left arm.

## 2012-06-27 NOTE — ED Provider Notes (Signed)
History  This chart was scribed for [name], MD by Erskine Emery, ED Scribe. This patient was seen in room MH06/MH06 and the patient's care was started at 00:22.   CSN: [id number]  Arrival date & time 06/27/12  0018   First MD Initiated Contact with Patient 06/27/12 0022      No chief complaint on file.   (Consider location/radiation/quality/duration/timing/severity/associated sxs/prior treatment) Patient is a 72 y.o. female presenting with fall. The history is provided by the patient and the spouse. No language interpreter was used.  Fall The accident occurred 1 to 2 hours ago. The fall occurred while walking. She landed on a hard floor. The volume of blood lost was minimal. The point of impact was the head. The pain is present in the head (left shin right shin). The pain is severe. She was ambulatory at the scene. There was no entrapment after the fall. There was no drug use involved in the accident. Pertinent negatives include no visual change, no abdominal pain, no vomiting, no headaches and no loss of consciousness. The symptoms are aggravated by activity. She has tried nothing for the symptoms. The treatment provided no relief.  [name] is a 72 y.o. female who presents to the Emergency Department complaining of bilateral leg pain, and hematomas since she tripped and fell down 8 stairs, head-first, about an hour ago. Pt denies any associated headache or LOC. Pt hasn't taken anything for the pain, she just used ice packs. Pt has hypothyroidism and a h/o 3 cancers, the last of which was 6 years ago. She broke her left ankle in 2010. Pt is not on aspirin, plavix, or coumadin. She occasionally takes flexeril for her neck pain and occasionally drinks but she has had no alcohol today. Pt does not know when her last Tetanus shot was but knows it was several years ago.  She has a PCP, orthopedist, and an oncologist.    Past Medical History  Diagnosis Date  . Ovarian cancer      March 1998  . DCIS (ductal carcinoma in situ) of breast     Past Surgical History  Procedure Date  . Biopsy thyroid     for benign disease  . Colonscopy 2004    2/08  . Left ankle fracture     status post surgery, March 2010  . Cholecystectomy     Family History  Problem Relation Age of Onset  . Cancer Mother     Corky Mull, historyof melanoma   . Aneurysm Father     abdominal aortic  . Heart disease Father   . Diverticulitis Brother     History  Substance Use Topics  . Smoking status: Former Smoker    Quit date: 05/26/1991  . Smokeless tobacco: Never Used  . Alcohol Use: Not on file    OB History    Grav Para Term Preterm Abortions TAB SAB Ect Mult Living                  Review of Systems  Gastrointestinal: Negative for vomiting and abdominal pain.  Musculoskeletal:       Bilateral leg pain and swelling.  Neurological: Negative for loss of consciousness, syncope and headaches.  All other systems reviewed and are negative.    Allergies  Codeine sulfate and Promethazine hcl  Home Medications   Current Outpatient Rx  Name  Route  Sig  Dispense  Refill  . B COMPLEX PO TABS   Oral   Take  1 tablet by mouth daily.           Marland Kitchen CALCIUM CARBONATE-VITAMIN D 600-400 MG-UNIT PO CHEW   Oral   Chew 1 tablet by mouth daily.           Marland Kitchen VITAMIN D 1000 UNITS PO CAPS   Oral   Take 1,000 Units by mouth daily.           . CO Q 10 10 MG PO CAPS   Oral   Take by mouth daily.           . CYCLOBENZAPRINE HCL 5 MG PO TABS   Oral   Take 1 tablet (5 mg total) by mouth every 8 (eight) hours as needed for muscle spasms.   30 tablet   1   . ESTRADIOL 1 MG PO TABS   Oral   Take 1 mg by mouth daily.           . OMEGA-3 FATTY ACIDS 1000 MG PO CAPS   Oral   Take 2 g by mouth daily.           Marland Kitchen LEVOTHYROXINE SODIUM 88 MCG PO TABS   Oral   Take 1 tablet (88 mcg total) by mouth daily.   90 tablet   6   . MELATONIN 1 MG PO CAPS   Oral   Take by  mouth at bedtime as needed.         . MULTIVITAMINS PO TABS   Oral   Take 1 tablet by mouth daily.             Triage Vitals: BP 126/61  Pulse 72  Temp 97.5 F (36.4 C) (Oral)  Resp 16  SpO2 100%  Physical Exam  Nursing note and vitals reviewed. Constitutional: She is oriented to person, place, and time. She appears well-developed and well-nourished. No distress.  HENT:  Head: Normocephalic and atraumatic. Head is without raccoon's eyes and without Battle's sign.  Right Ear: External ear normal. No hemotympanum.  Left Ear: External ear normal. No hemotympanum.  Eyes: EOM are normal. Pupils are equal, round, and reactive to light.  Neck: Normal range of motion. Neck supple. No tracheal deviation present.  Cardiovascular: Normal rate, regular rhythm and normal heart sounds.   No murmur heard. Pulmonary/Chest: Effort normal and breath sounds normal. No respiratory distress. She has no wheezes.       Lungs are clear  Abdominal: Soft. Bowel sounds are normal. She exhibits no distension. There is no tenderness. There is no rebound and no guarding.  Musculoskeletal: Normal range of motion. She exhibits no edema.       Bruising and hematomas shins proximally. Intact dorsalis pedis pulse B. 10cm by 7cm hematoma on the left lower leg. Negative anterior and posterior drawer test on left knee and right knee. No laxity to varus or valgus stress B. No step offs of the C T or L spine. No pain with flexion or extension.  BLE sensation intact. Compartments soft B.    Neurological: She is alert and oriented to person, place, and time. She has normal reflexes.       5/5 strength B  Skin: Skin is warm and dry.       Abrasions over rib # 6 on right, and 7 and 8 on the left. Abrasion on right forearm.  Psychiatric: She has a normal mood and affect.    ED Course  Procedures (including critical care time) DIAGNOSTIC STUDIES: Oxygen Saturation is  100% on room air, normal by my interpretation.     COORDINATION OF CARE: 00:22--I evaluated the patient and we discussed a treatment plan including pain medication, x-rays, CT scans, and follow up with orthopedist to which the pt agreed. I told the pt to follow up with her family doctor within 2-3 days and her orthopedist within 2 days. I notified her to take note of any numbness, tingling, blueness, or whiteness in the legs.    Labs Reviewed - No data to display No results found.   No diagnosis found.    MDM  Hematomas of B shins.  Follow up with orthopedics.  Return to the closest ED for increased swelling change in color or sensation of BLE.  Incentive spirometer provided      I personally performed the services described in this documentation, which was scribed in my presence. The recorded information has been reviewed and is accurate.     Jasmine Awe, MD 06/27/12 [phone]

## 2012-06-30 DIAGNOSIS — T148XXA Other injury of unspecified body region, initial encounter: Secondary | ICD-10-CM | POA: Diagnosis not present

## 2012-07-04 DIAGNOSIS — T148XXA Other injury of unspecified body region, initial encounter: Secondary | ICD-10-CM | POA: Diagnosis not present

## 2012-07-05 ENCOUNTER — Other Ambulatory Visit: Payer: Self-pay | Admitting: Internal Medicine

## 2012-07-08 ENCOUNTER — Ambulatory Visit: Payer: Medicare Other | Admitting: Internal Medicine

## 2012-07-11 ENCOUNTER — Ambulatory Visit (INDEPENDENT_AMBULATORY_CARE_PROVIDER_SITE_OTHER): Payer: Medicare Other | Admitting: Internal Medicine

## 2012-07-11 ENCOUNTER — Encounter: Payer: Self-pay | Admitting: Internal Medicine

## 2012-07-11 VITALS — BP 120/70 | HR 100 | Temp 97.6°F | Resp 18 | Wt 164.0 lb

## 2012-07-11 DIAGNOSIS — R609 Edema, unspecified: Secondary | ICD-10-CM | POA: Diagnosis not present

## 2012-07-11 DIAGNOSIS — E039 Hypothyroidism, unspecified: Secondary | ICD-10-CM | POA: Diagnosis not present

## 2012-07-11 DIAGNOSIS — S8010XA Contusion of unspecified lower leg, initial encounter: Secondary | ICD-10-CM

## 2012-07-11 DIAGNOSIS — R600 Localized edema: Secondary | ICD-10-CM

## 2012-07-11 MED ORDER — OXYCODONE-ACETAMINOPHEN 5-325 MG PO TABS: 1.0000 | ORAL_TABLET | Freq: Four times a day (QID) | ORAL | Status: DC | PRN

## 2012-07-11 NOTE — Progress Notes (Signed)
Subjective:    Patient ID: [name], female    DOB: 16-Sep-1940, 72 y.o.   MRN: [id number]  HPI  72 year old patient who was evaluated in the emergency department 2 weeks ago after falling backwards down several stairs. She had extensive radiologic evaluation including head and C-spine CT. Multiple radiographs included the knees in both lower extremities. She has had orthopedic followup. She continues to have lower extremity hematomas especially involving the left medial leg just distal to the knee. Her legs are still uncomfortable but improving. Left leg hematoma still quite tender but slowly improving  Past Medical History  Diagnosis Date  . Ovarian cancer     March 1998  . DCIS (ductal carcinoma in situ) of breast     History   Social History  . Marital Status: Divorced    Spouse Name: N/A    Number of Children: N/A  . Years of Education: N/A   Occupational History  . Not on file.   Social History Main Topics  . Smoking status: Former Smoker    Quit date: 05/26/1991  . Smokeless tobacco: Never Used  . Alcohol Use: Yes  . Drug Use: No  . Sexually Active: Not on file   Other Topics Concern  . Not on file   Social History Narrative   March 1998- history of stage III ovarian cancer; status post suboptimal debulking; status post 6 cycles of Taxoil and carboplatin with complete remission documented by second look surgery September 1998.      Recurrent ovarian cancer, status post 6 cycles carbo and Taxol, April 1998 until July 2008      1992, left breast intraductal carcinoma (DCIS)   Status post left modified radial mastectomy and with silicon implant   13 of 13 lymph modes negative; positive for BCRA1 gene   Recurrent ovarian carinoma, February 2008   Admit Feb. 2009 partial small bowel obstruction treated medically    Past Surgical History  Procedure Laterality Date  . Biopsy thyroid      for benign disease  . Colonscopy  2004    2/08  . Left ankle fracture       status post surgery, March 2010  . Cholecystectomy      Family History  Problem Relation Age of Onset  . Cancer Mother     Corky Mull, historyof melanoma   . Aneurysm Father     abdominal aortic  . Heart disease Father   . Diverticulitis Brother     Allergies  Allergen Reactions  . Codeine Sulfate     REACTION: unspecified  . Promethazine Hcl     REACTION: unspecified    Current Outpatient Prescriptions on File Prior to Visit  Medication Sig Dispense Refill  . b complex vitamins tablet Take 1 tablet by mouth daily.        . Calcium Carbonate-Vitamin D (CALTRATE 600+D) 600-400 MG-UNIT per chew tablet Chew 1 tablet by mouth daily.        . Cholecalciferol (VITAMIN D) 1000 UNITS capsule Take 1,000 Units by mouth daily.        . Coenzyme Q10 (CO Q 10) 10 MG CAPS Take by mouth daily.        . cyclobenzaprine (FLEXERIL) 5 MG tablet Take 1 tablet (5 mg total) by mouth every 8 (eight) hours as needed for muscle spasms.  30 tablet  1  . estradiol (ESTRACE) 1 MG tablet Take 1 mg by mouth daily.        Marland Kitchen  fish oil-omega-3 fatty acids 1000 MG capsule Take 2 g by mouth daily.        Marland Kitchen levothyroxine (SYNTHROID, LEVOTHROID) 88 MCG tablet TAKE 1 TABLET EVERY DAY  90 tablet  3  . Melatonin 1 MG CAPS Take by mouth at bedtime as needed.      . multivitamin (THERAGRAN) per tablet Take 1 tablet by mouth daily.         No current facility-administered medications on file prior to visit.    BP 120/70  Pulse 100  Temp(Src) 97.6 F (36.4 C) (Oral)  Resp 18  Wt 164 lb (74.39 kg)  BMI 25.68 kg/m2  SpO2 95%      Review of Systems  Cardiovascular: Positive for leg swelling.  Musculoskeletal: Positive for gait problem.       Objective:   Physical Exam  Constitutional: She appears well-developed and well-nourished. No distress.  Musculoskeletal:  Prominent 5-6 cm hematoma involving the left medial lower leg just distal to the knee. Resolving ecchymoses noted involving both lower  extremities small hematoma present involving the right lower leg. Slight  lower extremity swelling; no calf tenderness or tightness          Assessment & Plan:   Status post multiple contusions secondary to traumatic fall. Resolving hematomas lower extremities. Will check a venous Doppler study due to her inactivity to exclude a DVT. Will continue rest elevation and symptom control

## 2012-07-11 NOTE — Patient Instructions (Addendum)
Keep legs elevated as much as possible  Lower extremity venous Doppler study as discussed  Aspirin 81 mg daily

## 2012-07-12 ENCOUNTER — Ambulatory Visit: Payer: Medicare Other | Admitting: Internal Medicine

## 2012-07-12 ENCOUNTER — Encounter (INDEPENDENT_AMBULATORY_CARE_PROVIDER_SITE_OTHER): Payer: Medicare Other

## 2012-07-12 ENCOUNTER — Ambulatory Visit (HOSPITAL_BASED_OUTPATIENT_CLINIC_OR_DEPARTMENT_OTHER): Payer: Medicare Other

## 2012-07-12 VITALS — BP 124/69 | HR 67 | Temp 97.7°F

## 2012-07-12 DIAGNOSIS — M79609 Pain in unspecified limb: Secondary | ICD-10-CM | POA: Diagnosis not present

## 2012-07-12 DIAGNOSIS — M7989 Other specified soft tissue disorders: Secondary | ICD-10-CM

## 2012-07-12 DIAGNOSIS — Z452 Encounter for adjustment and management of vascular access device: Secondary | ICD-10-CM

## 2012-07-12 DIAGNOSIS — C569 Malignant neoplasm of unspecified ovary: Secondary | ICD-10-CM | POA: Diagnosis not present

## 2012-07-12 DIAGNOSIS — R600 Localized edema: Secondary | ICD-10-CM

## 2012-07-12 MED ORDER — HEPARIN SOD (PORK) LOCK FLUSH 100 UNIT/ML IV SOLN
500.0000 [IU] | Freq: Once | INTRAVENOUS | Status: AC
  Administered 2012-07-12: 500 [IU] via INTRAVENOUS
  Filled 2012-07-12: qty 5

## 2012-07-12 MED ORDER — SODIUM CHLORIDE 0.9 % IJ SOLN
10.0000 mL | INTRAMUSCULAR | Status: DC | PRN
  Administered 2012-07-12: 10 mL via INTRAVENOUS
  Filled 2012-07-12: qty 10

## 2012-07-14 DIAGNOSIS — T148XXA Other injury of unspecified body region, initial encounter: Secondary | ICD-10-CM | POA: Diagnosis not present

## 2012-07-28 DIAGNOSIS — T148XXA Other injury of unspecified body region, initial encounter: Secondary | ICD-10-CM | POA: Diagnosis not present

## 2012-08-01 ENCOUNTER — Ambulatory Visit: Payer: Medicare Other | Attending: Orthopedic Surgery | Admitting: Physical Therapy

## 2012-08-01 DIAGNOSIS — M25569 Pain in unspecified knee: Secondary | ICD-10-CM | POA: Diagnosis not present

## 2012-08-01 DIAGNOSIS — IMO0001 Reserved for inherently not codable concepts without codable children: Secondary | ICD-10-CM | POA: Insufficient documentation

## 2012-08-03 ENCOUNTER — Ambulatory Visit: Payer: Medicare Other | Admitting: Physical Therapy

## 2012-08-03 DIAGNOSIS — IMO0001 Reserved for inherently not codable concepts without codable children: Secondary | ICD-10-CM | POA: Diagnosis not present

## 2012-08-03 DIAGNOSIS — M25569 Pain in unspecified knee: Secondary | ICD-10-CM | POA: Diagnosis not present

## 2012-08-08 ENCOUNTER — Ambulatory Visit: Payer: Medicare Other | Admitting: Physical Therapy

## 2012-08-11 ENCOUNTER — Ambulatory Visit: Payer: Medicare Other | Admitting: Physical Therapy

## 2012-08-11 DIAGNOSIS — IMO0001 Reserved for inherently not codable concepts without codable children: Secondary | ICD-10-CM | POA: Diagnosis not present

## 2012-08-11 DIAGNOSIS — M25569 Pain in unspecified knee: Secondary | ICD-10-CM | POA: Diagnosis not present

## 2012-08-15 ENCOUNTER — Ambulatory Visit: Payer: Medicare Other | Admitting: Physical Therapy

## 2012-08-15 DIAGNOSIS — M25569 Pain in unspecified knee: Secondary | ICD-10-CM | POA: Diagnosis not present

## 2012-08-15 DIAGNOSIS — IMO0001 Reserved for inherently not codable concepts without codable children: Secondary | ICD-10-CM | POA: Diagnosis not present

## 2012-08-17 ENCOUNTER — Ambulatory Visit: Payer: Medicare Other | Admitting: Physical Therapy

## 2012-08-17 DIAGNOSIS — IMO0001 Reserved for inherently not codable concepts without codable children: Secondary | ICD-10-CM | POA: Diagnosis not present

## 2012-08-17 DIAGNOSIS — M25569 Pain in unspecified knee: Secondary | ICD-10-CM | POA: Diagnosis not present

## 2012-08-22 ENCOUNTER — Ambulatory Visit: Payer: Medicare Other | Admitting: Physical Therapy

## 2012-08-22 DIAGNOSIS — IMO0001 Reserved for inherently not codable concepts without codable children: Secondary | ICD-10-CM | POA: Diagnosis not present

## 2012-08-22 DIAGNOSIS — M25569 Pain in unspecified knee: Secondary | ICD-10-CM | POA: Diagnosis not present

## 2012-08-24 ENCOUNTER — Ambulatory Visit: Payer: Medicare Other | Attending: Orthopedic Surgery | Admitting: Physical Therapy

## 2012-08-24 DIAGNOSIS — M25569 Pain in unspecified knee: Secondary | ICD-10-CM | POA: Insufficient documentation

## 2012-08-24 DIAGNOSIS — IMO0001 Reserved for inherently not codable concepts without codable children: Secondary | ICD-10-CM | POA: Insufficient documentation

## 2012-08-29 DIAGNOSIS — T148XXA Other injury of unspecified body region, initial encounter: Secondary | ICD-10-CM | POA: Diagnosis not present

## 2012-08-31 ENCOUNTER — Ambulatory Visit: Payer: Medicare Other | Admitting: Physical Therapy

## 2012-08-31 DIAGNOSIS — M25569 Pain in unspecified knee: Secondary | ICD-10-CM | POA: Diagnosis not present

## 2012-08-31 DIAGNOSIS — IMO0001 Reserved for inherently not codable concepts without codable children: Secondary | ICD-10-CM | POA: Diagnosis not present

## 2012-09-05 ENCOUNTER — Ambulatory Visit: Payer: Medicare Other | Admitting: Physical Therapy

## 2012-09-05 DIAGNOSIS — IMO0001 Reserved for inherently not codable concepts without codable children: Secondary | ICD-10-CM | POA: Diagnosis not present

## 2012-09-05 DIAGNOSIS — M25569 Pain in unspecified knee: Secondary | ICD-10-CM | POA: Diagnosis not present

## 2012-09-06 ENCOUNTER — Ambulatory Visit: Payer: Medicare Other

## 2012-09-06 ENCOUNTER — Ambulatory Visit (HOSPITAL_BASED_OUTPATIENT_CLINIC_OR_DEPARTMENT_OTHER): Payer: Medicare Other | Admitting: Oncology

## 2012-09-06 ENCOUNTER — Other Ambulatory Visit (HOSPITAL_BASED_OUTPATIENT_CLINIC_OR_DEPARTMENT_OTHER): Payer: Medicare Other | Admitting: Lab

## 2012-09-06 ENCOUNTER — Encounter: Payer: Self-pay | Admitting: Oncology

## 2012-09-06 ENCOUNTER — Telehealth: Payer: Self-pay | Admitting: Oncology

## 2012-09-06 VITALS — BP 118/66 | HR 64 | Temp 96.6°F | Resp 20 | Ht 67.0 in | Wt 164.2 lb

## 2012-09-06 DIAGNOSIS — C50919 Malignant neoplasm of unspecified site of unspecified female breast: Secondary | ICD-10-CM | POA: Diagnosis not present

## 2012-09-06 DIAGNOSIS — Z853 Personal history of malignant neoplasm of breast: Secondary | ICD-10-CM | POA: Diagnosis not present

## 2012-09-06 DIAGNOSIS — C569 Malignant neoplasm of unspecified ovary: Secondary | ICD-10-CM

## 2012-09-06 DIAGNOSIS — G629 Polyneuropathy, unspecified: Secondary | ICD-10-CM | POA: Insufficient documentation

## 2012-09-06 DIAGNOSIS — Z803 Family history of malignant neoplasm of breast: Secondary | ICD-10-CM

## 2012-09-06 DIAGNOSIS — E559 Vitamin D deficiency, unspecified: Secondary | ICD-10-CM | POA: Diagnosis not present

## 2012-09-06 DIAGNOSIS — Z8543 Personal history of malignant neoplasm of ovary: Secondary | ICD-10-CM

## 2012-09-06 DIAGNOSIS — G609 Hereditary and idiopathic neuropathy, unspecified: Secondary | ICD-10-CM | POA: Diagnosis not present

## 2012-09-06 HISTORY — DX: Polyneuropathy, unspecified: G62.9

## 2012-09-06 LAB — CBC WITH DIFFERENTIAL/PLATELET
Eosinophils Absolute: 0.2 10*3/uL (ref 0.0–0.5)
HCT: 44.5 % (ref 34.8–46.6)
HGB: 14.9 g/dL (ref 11.6–15.9)
LYMPH%: 24.4 % (ref 14.0–49.7)
MONO#: 0.5 10*3/uL (ref 0.1–0.9)
NEUT#: 3.5 10*3/uL (ref 1.5–6.5)
Platelets: 195 10*3/uL (ref 145–400)
RBC: 4.74 10*6/uL (ref 3.70–5.45)
WBC: 5.8 10*3/uL (ref 3.9–10.3)

## 2012-09-06 LAB — COMPREHENSIVE METABOLIC PANEL (CC13)
Albumin: 3.4 g/dL — ABNORMAL LOW (ref 3.5–5.0)
CO2: 26 mEq/L (ref 22–29)
Glucose: 93 mg/dl (ref 70–99)
Sodium: 139 mEq/L (ref 136–145)
Total Bilirubin: 0.37 mg/dL (ref 0.20–1.20)
Total Protein: 6.8 g/dL (ref 6.4–8.3)

## 2012-09-06 LAB — CANCER ANTIGEN 27.29: CA 27.29: 24 U/mL (ref 0–39)

## 2012-09-06 LAB — LACTATE DEHYDROGENASE (CC13): LDH: 142 U/L (ref 125–245)

## 2012-09-06 MED ORDER — HEPARIN SOD (PORK) LOCK FLUSH 100 UNIT/ML IV SOLN
500.0000 [IU] | Freq: Once | INTRAVENOUS | Status: AC
  Administered 2012-09-06: 500 [IU] via INTRAVENOUS
  Filled 2012-09-06: qty 5

## 2012-09-06 MED ORDER — SODIUM CHLORIDE 0.9 % IJ SOLN
10.0000 mL | INTRAMUSCULAR | Status: DC | PRN
  Administered 2012-09-06: 10 mL via INTRAVENOUS
  Filled 2012-09-06: qty 10

## 2012-09-06 NOTE — Progress Notes (Signed)
Port flush done on MD side.

## 2012-09-06 NOTE — Progress Notes (Signed)
This office note has been dictated.  #[id number]

## 2012-09-07 ENCOUNTER — Ambulatory Visit: Payer: Medicare Other | Admitting: Physical Therapy

## 2012-09-07 DIAGNOSIS — M25569 Pain in unspecified knee: Secondary | ICD-10-CM | POA: Diagnosis not present

## 2012-09-07 DIAGNOSIS — E559 Vitamin D deficiency, unspecified: Secondary | ICD-10-CM | POA: Insufficient documentation

## 2012-09-07 DIAGNOSIS — IMO0001 Reserved for inherently not codable concepts without codable children: Secondary | ICD-10-CM | POA: Diagnosis not present

## 2012-09-07 NOTE — Progress Notes (Signed)
CC:   [name], M.D. De Blanch, M.D. Luvenia Redden, M.D. Gordy Savers, MD Everardo All. Madilyn Fireman, M.D.  PROBLEM LIST:  1. Recurrent ovarian cancer with original diagnosis going back to  March 1998. Stewart presented at that time with stage III disease  which was characterized by an elevated CA 27.29. Her CA 125 was  normal. [name] underwent debulking surgery, which I believe was  suboptimal. She then received 6 cycles of carboplatin and Taxol.  She underwent second-look surgery on 02/20/1997 and was found to be  disease free. She then received adjuvant topotecan from November  1998 through September 10, 1997. Devinn did well until early part of  2008 when she was having abdominal pain and was found to have a  tumor involving the mid ascending colon. She underwent surgery by  Dr. Cicero Duck on 07/30/2006. The tumor of the colon which  appeared to be growing from the outside into the colon was  resected. There was also a left pelvic nodule that contained  tumor. All of this underwent extensive testing and was found to be  recurrent ovarian cancer rather than primary colon cancer. [name]  then underwent further chemotherapy with carboplatin and Taxol in  combination with Neulasta for 6 cycles from 09/09/2006 through  12/23/2006. She remains disease free. Miyonna's most recent PET scan  was carried out on 03/02/2012 and was negative.  2. Intraductal carcinoma of the left breast diagnosed in 1992, status  post left modified radical mastectomy with all 13 lymph nodes  negative followed by a silicone implant that was later removed  because of leakage on 07/22/2004. This was replaced with a saline  implant.  3. BRCA1 mutation positive with strikingly positive family history for  breast and ovarian cancer.  4. History of bilateral silicone breast implants. Right breast  underwent augmentation in 1981, and following a left modified  radical mastectomy for intraductal carcinoma  of the left breast a  silicone implant was placed in 1992. Both implants were replaced  by saline implants on 07/22/2004. The left silicone implant  apparently had leaked.  5. History of rosacea.  6. Hypothyroidism.  7. Right Port-A-Cath placed on 07/30/2006. Currently being maintained  with heparin flushes every 2 months.   MEDICATIONS:  Reviewed and recorded. Current Outpatient Prescriptions  Medication Sig Dispense Refill  . b complex vitamins tablet Take 1 tablet by mouth daily.        . Calcium Carbonate-Vitamin D (CALTRATE 600+D) 600-400 MG-UNIT per chew tablet Chew 1 tablet by mouth daily.        . Cholecalciferol (VITAMIN D) 1000 UNITS capsule Take 1,000 Units by mouth daily.        . Coenzyme Q10 (CO Q 10) 10 MG CAPS Take by mouth daily.        Marland Kitchen ECHINACEA PO Take 1 tablet by mouth every other day.      . estradiol (ESTRACE) 1 MG tablet Take 1 mg by mouth daily.        . fish oil-omega-3 fatty acids 1000 MG capsule Take 2 g by mouth daily.        . Glucosamine 500 MG TABS Take 1,500 mg by mouth daily.      Marland Kitchen levothyroxine (SYNTHROID, LEVOTHROID) 88 MCG tablet TAKE 1 TABLET EVERY DAY  90 tablet  3  . Melatonin 1 MG CAPS Take by mouth at bedtime as needed.      . multivitamin (THERAGRAN) per tablet Take 1 tablet by  mouth daily.        . vitamin C (ASCORBIC ACID) 500 MG tablet Take 500 mg by mouth daily.       No current facility-administered medications for this visit.    SMOKING HISTORY:  Nigel is a former smoker.   HISTORY:  I saw Klohe Lovering today for followup of her recurrent ovarian cancer, with original diagnosis going back to March 1998, currently without evidence of disease after suffering a recurrence in March 2008.  Anayla also has a history of an intraductal carcinoma of the left breast dating back to 55.  She has the BRCA1 mutation, with strikingly positive family history for breast and ovarian cancer.  Mahrukh was last seen by [place] on 03/10/2012.  Prior to  that visit she had a negative PET scan on 03/02/2012, also a negative colonoscopy on 03/08/2012.  She has a Port-A-Cath which we have maintained with heparin flushes every 2 months.  Judene is without any specific complaints to suggest recurrence of either cancer; however, she does have peripheral neuropathy, mostly some burning and numbness on the soles of her feet. She has had this for the past 6 years since she received Taxol and attributes her neuropathy to the Taxol.  In addition, Toneshia fell down some stairs a couple of months ago and sustained some bruises and hematomas, but no fractures.  She is still getting over an injury to her left leg.  In general, she feels well.  PHYSICAL EXAMINATION:  General:  She looks well.  She is now 72 years old.  Vital Signs:  Weight is 164 pounds 3.2 ounces, height 5 feet 7 inches, body surface area 1.88 meters squared.  Blood pressure 118/66. Other vital signs are normal.  HEENT:  There is no scleral icterus. Mouth and pharynx are benign.  Lymph:  There is no peripheral adenopathy palpable.  Lines:  There is a right-sided Port-A-Cath.  This was flushed with heparin today.  Heart and lungs:  Normal.  Breasts:  Not examined. Alayne saw her gynecologist, Dr. Greta Doom, toward the end of 2013.  Abdomen: Benign, with no organomegaly or masses palpable.  Extremities:  No peripheral edema.  Skin:  She has a lot of sun-related damage involving the skin, particularly involving the arms and legs.  She does have a rather large hematoma on the medial aspect of the left lower leg just below the knee.  There is some discoloration.  This area apparently is slowly resolving.  LABORATORY DATA:  White count 5.8, ANC 3.5, hemoglobin 14.9, hematocrit 44.5, platelets 195,000.  Chemistries were normal except for an albumin of 3.4.  CA27.29 is pending.  CA27.29 on 03/10/2012 was 25.  IMAGING STUDIES:  1. The PET scan from 03/26/2010 showed no specific features to suggest   residual or recurrent tumor.  2. Screening right mammogram from 02/25/2011 was negative.  3. PET scan from 03/09/2011 showed no evidence for recurrent breast or  ovarian cancer.  4. Right digital screening mammogram on 02/26/2012 was negative.  5. PET scan from 03/02/2012 showed no evidence for local recurrence or  metastatic disease. 6. Chest x-ray, 2 view, from 06/27/2012 showed no evidence of active pulmonary disease.    PROCEDURES:  1. Colonoscopy carried out by Dr. Dorena Cookey on 03/08/2012 was unremarkable.  Prior colonoscopy was on 07/05/2006.   IMPRESSION AND PLAN:  Zaley continues to do well, now 6 years from the time of her recurrent ovarian cancer, which presented with an isolated colonic obstruction.  The original  tumor goes back 16 years to March 1998.  Terrilee's port was flushed with heparin.  We will maintain the Port-A-Cath with heparin flushes every 2 months.  We will plan to see Kieran again in 6 months, at which time we will check CBC, chemistries, and another CA27.29.  Gia requested that we order a vitamin B12 and a vitamin D level for her in view of her peripheral neuropathy.  We talked about a neurology consultation and possibly use of either Neurontin or Lyrica for Diamonds's symptoms, which are most likely due to Taxol.  Nicolas was somewhat reluctant to consider any pharmacologic intervention because of her concerns about side affects.  She will contact us if she wants a neurology consultation.    ______________________________ Samul Dada, M.D. DSM/MEDQ  D:  09/06/2012  T:  09/07/2012  Job:  [id number]

## 2012-09-09 ENCOUNTER — Telehealth: Payer: Self-pay | Admitting: Medical Oncology

## 2012-09-09 NOTE — Telephone Encounter (Signed)
I called pt to inform her that her Vit D and Vit B12 levels are normal. She voiced understanding.

## 2012-09-12 ENCOUNTER — Ambulatory Visit: Payer: Medicare Other | Admitting: Physical Therapy

## 2012-09-12 DIAGNOSIS — IMO0001 Reserved for inherently not codable concepts without codable children: Secondary | ICD-10-CM | POA: Diagnosis not present

## 2012-09-12 DIAGNOSIS — M25569 Pain in unspecified knee: Secondary | ICD-10-CM | POA: Diagnosis not present

## 2012-09-15 ENCOUNTER — Ambulatory Visit: Payer: Medicare Other | Admitting: Physical Therapy

## 2012-09-15 DIAGNOSIS — IMO0001 Reserved for inherently not codable concepts without codable children: Secondary | ICD-10-CM | POA: Diagnosis not present

## 2012-09-15 DIAGNOSIS — M25569 Pain in unspecified knee: Secondary | ICD-10-CM | POA: Diagnosis not present

## 2012-09-16 DIAGNOSIS — L578 Other skin changes due to chronic exposure to nonionizing radiation: Secondary | ICD-10-CM | POA: Diagnosis not present

## 2012-09-16 DIAGNOSIS — L819 Disorder of pigmentation, unspecified: Secondary | ICD-10-CM | POA: Diagnosis not present

## 2012-09-16 DIAGNOSIS — L820 Inflamed seborrheic keratosis: Secondary | ICD-10-CM | POA: Diagnosis not present

## 2012-09-16 DIAGNOSIS — L821 Other seborrheic keratosis: Secondary | ICD-10-CM | POA: Diagnosis not present

## 2012-09-19 ENCOUNTER — Ambulatory Visit: Payer: Medicare Other | Admitting: Physical Therapy

## 2012-09-19 ENCOUNTER — Encounter: Payer: Medicare Other | Admitting: Physical Therapy

## 2012-09-19 DIAGNOSIS — M25569 Pain in unspecified knee: Secondary | ICD-10-CM | POA: Diagnosis not present

## 2012-09-19 DIAGNOSIS — IMO0001 Reserved for inherently not codable concepts without codable children: Secondary | ICD-10-CM | POA: Diagnosis not present

## 2012-09-22 ENCOUNTER — Ambulatory Visit: Payer: Medicare Other | Attending: Orthopedic Surgery | Admitting: Physical Therapy

## 2012-09-22 DIAGNOSIS — IMO0001 Reserved for inherently not codable concepts without codable children: Secondary | ICD-10-CM | POA: Insufficient documentation

## 2012-09-22 DIAGNOSIS — M25569 Pain in unspecified knee: Secondary | ICD-10-CM | POA: Insufficient documentation

## 2012-09-26 DIAGNOSIS — T148XXA Other injury of unspecified body region, initial encounter: Secondary | ICD-10-CM | POA: Diagnosis not present

## 2012-11-08 ENCOUNTER — Ambulatory Visit (HOSPITAL_BASED_OUTPATIENT_CLINIC_OR_DEPARTMENT_OTHER): Payer: Medicare Other

## 2012-11-08 VITALS — BP 127/56 | HR 67 | Temp 98.1°F

## 2012-11-08 DIAGNOSIS — C569 Malignant neoplasm of unspecified ovary: Secondary | ICD-10-CM

## 2012-11-08 DIAGNOSIS — Z452 Encounter for adjustment and management of vascular access device: Secondary | ICD-10-CM

## 2012-11-08 MED ORDER — SODIUM CHLORIDE 0.9 % IJ SOLN
10.0000 mL | INTRAMUSCULAR | Status: DC | PRN
  Administered 2012-11-08: 10 mL via INTRAVENOUS
  Filled 2012-11-08: qty 10

## 2012-11-08 MED ORDER — HEPARIN SOD (PORK) LOCK FLUSH 100 UNIT/ML IV SOLN
500.0000 [IU] | Freq: Once | INTRAVENOUS | Status: AC
  Administered 2012-11-08: 500 [IU] via INTRAVENOUS
  Filled 2012-11-08: qty 5

## 2012-11-08 NOTE — Patient Instructions (Addendum)
Call MD for problems 

## 2012-11-22 ENCOUNTER — Telehealth: Payer: Self-pay | Admitting: Internal Medicine

## 2012-11-22 NOTE — Telephone Encounter (Signed)
Caller: Lynette/Patient; Phone: [phone]; Reason for Call: Pt needs advice concerning choosing a physician for an issue she is having.  Pt did not want to state her issue, but says Dr Amador Cunas should know what she is referring too.  Please have Dr Amador Cunas call her back directly.

## 2012-11-28 DIAGNOSIS — T148XXA Other injury of unspecified body region, initial encounter: Secondary | ICD-10-CM | POA: Diagnosis not present

## 2012-12-28 ENCOUNTER — Other Ambulatory Visit: Payer: Self-pay

## 2013-01-04 ENCOUNTER — Ambulatory Visit (HOSPITAL_BASED_OUTPATIENT_CLINIC_OR_DEPARTMENT_OTHER): Payer: Medicare Other

## 2013-01-04 VITALS — BP 118/65 | HR 61 | Temp 96.9°F

## 2013-01-04 DIAGNOSIS — Z452 Encounter for adjustment and management of vascular access device: Secondary | ICD-10-CM

## 2013-01-04 DIAGNOSIS — C569 Malignant neoplasm of unspecified ovary: Secondary | ICD-10-CM | POA: Diagnosis not present

## 2013-01-04 MED ORDER — SODIUM CHLORIDE 0.9 % IJ SOLN
10.0000 mL | INTRAMUSCULAR | Status: DC | PRN
  Administered 2013-01-04: 10 mL via INTRAVENOUS
  Filled 2013-01-04: qty 10

## 2013-01-04 MED ORDER — HEPARIN SOD (PORK) LOCK FLUSH 100 UNIT/ML IV SOLN
500.0000 [IU] | Freq: Once | INTRAVENOUS | Status: AC
  Administered 2013-01-04: 500 [IU] via INTRAVENOUS
  Filled 2013-01-04: qty 5

## 2013-01-20 ENCOUNTER — Telehealth: Payer: Self-pay | Admitting: Oncology

## 2013-01-20 NOTE — Telephone Encounter (Signed)
s.w. pt and advised on 10.16 appt time change pt needed afternoon appt...done

## 2013-01-20 NOTE — Telephone Encounter (Signed)
Pt moved back to LL per Vanessa Barbara.. S/w pt re change to LL and new time for 10/15 lb/flush/LL @ 2:15pm.

## 2013-02-03 ENCOUNTER — Other Ambulatory Visit: Payer: Self-pay

## 2013-02-03 DIAGNOSIS — Z9012 Acquired absence of left breast and nipple: Secondary | ICD-10-CM

## 2013-02-03 DIAGNOSIS — Z1231 Encounter for screening mammogram for malignant neoplasm of breast: Secondary | ICD-10-CM

## 2013-02-27 DIAGNOSIS — H25019 Cortical age-related cataract, unspecified eye: Secondary | ICD-10-CM | POA: Diagnosis not present

## 2013-02-27 DIAGNOSIS — H5200 Hypermetropia, unspecified eye: Secondary | ICD-10-CM | POA: Diagnosis not present

## 2013-02-27 DIAGNOSIS — H2510 Age-related nuclear cataract, unspecified eye: Secondary | ICD-10-CM | POA: Diagnosis not present

## 2013-02-27 DIAGNOSIS — H524 Presbyopia: Secondary | ICD-10-CM | POA: Diagnosis not present

## 2013-03-04 ENCOUNTER — Other Ambulatory Visit: Payer: Self-pay | Admitting: Oncology

## 2013-03-04 DIAGNOSIS — Z8543 Personal history of malignant neoplasm of ovary: Secondary | ICD-10-CM

## 2013-03-06 ENCOUNTER — Ambulatory Visit
Admission: RE | Admit: 2013-03-06 | Discharge: 2013-03-06 | Disposition: A | Discharge status: Home / Self Care | Payer: Medicare Other | Source: Ambulatory Visit

## 2013-03-06 DIAGNOSIS — Z1231 Encounter for screening mammogram for malignant neoplasm of breast: Secondary | ICD-10-CM | POA: Diagnosis not present

## 2013-03-06 DIAGNOSIS — Z9012 Acquired absence of left breast and nipple: Secondary | ICD-10-CM

## 2013-03-07 ENCOUNTER — Ambulatory Visit: Payer: Medicare Other

## 2013-03-08 ENCOUNTER — Ambulatory Visit (HOSPITAL_BASED_OUTPATIENT_CLINIC_OR_DEPARTMENT_OTHER): Payer: Medicare Other | Admitting: Oncology

## 2013-03-08 ENCOUNTER — Ambulatory Visit (HOSPITAL_BASED_OUTPATIENT_CLINIC_OR_DEPARTMENT_OTHER): Payer: Medicare Other

## 2013-03-08 ENCOUNTER — Telehealth: Payer: Self-pay | Admitting: Oncology

## 2013-03-08 ENCOUNTER — Other Ambulatory Visit (HOSPITAL_BASED_OUTPATIENT_CLINIC_OR_DEPARTMENT_OTHER): Payer: Medicare Other | Admitting: Lab

## 2013-03-08 ENCOUNTER — Encounter: Payer: Self-pay | Admitting: Oncology

## 2013-03-08 VITALS — BP 134/71 | HR 72 | Resp 18 | Ht 67.0 in | Wt 166.6 lb

## 2013-03-08 VITALS — BP 129/61 | HR 78 | Temp 97.7°F | Resp 18

## 2013-03-08 DIAGNOSIS — Z1501 Genetic susceptibility to malignant neoplasm of breast: Secondary | ICD-10-CM

## 2013-03-08 DIAGNOSIS — Z853 Personal history of malignant neoplasm of breast: Secondary | ICD-10-CM

## 2013-03-08 DIAGNOSIS — C569 Malignant neoplasm of unspecified ovary: Secondary | ICD-10-CM

## 2013-03-08 DIAGNOSIS — L989 Disorder of the skin and subcutaneous tissue, unspecified: Secondary | ICD-10-CM

## 2013-03-08 DIAGNOSIS — C50912 Malignant neoplasm of unspecified site of left female breast: Secondary | ICD-10-CM

## 2013-03-08 DIAGNOSIS — Z8543 Personal history of malignant neoplasm of ovary: Secondary | ICD-10-CM

## 2013-03-08 DIAGNOSIS — C50919 Malignant neoplasm of unspecified site of unspecified female breast: Secondary | ICD-10-CM | POA: Diagnosis not present

## 2013-03-08 DIAGNOSIS — I890 Lymphedema, not elsewhere classified: Secondary | ICD-10-CM

## 2013-03-08 LAB — CBC WITH DIFFERENTIAL/PLATELET
BASO%: 0.6 % (ref 0.0–2.0)
Basophils Absolute: 0 10*3/uL (ref 0.0–0.1)
EOS%: 2.3 % (ref 0.0–7.0)
Eosinophils Absolute: 0.1 10*3/uL (ref 0.0–0.5)
HCT: 44.3 % (ref 34.8–46.6)
HGB: 15.1 g/dL (ref 11.6–15.9)
LYMPH%: 22.6 % (ref 14.0–49.7)
MCH: 32 pg (ref 25.1–34.0)
MCV: 93.9 fL (ref 79.5–101.0)
MONO%: 7.5 % (ref 0.0–14.0)
NEUT#: 3.9 10*3/uL (ref 1.5–6.5)
NEUT%: 67 % (ref 38.4–76.8)
Platelets: 205 10*3/uL (ref 145–400)
RBC: 4.72 10*6/uL (ref 3.70–5.45)

## 2013-03-08 LAB — COMPREHENSIVE METABOLIC PANEL (CC13)
ALT: 22 U/L (ref 0–55)
AST: 17 U/L (ref 5–34)
Alkaline Phosphatase: 80 U/L (ref 40–150)
CO2: 26 mEq/L (ref 22–29)
Chloride: 105 mEq/L (ref 98–109)
Creatinine: 0.8 mg/dL (ref 0.6–1.1)
Sodium: 142 mEq/L (ref 136–145)
Total Bilirubin: 0.48 mg/dL (ref 0.20–1.20)
Total Protein: 6.9 g/dL (ref 6.4–8.3)

## 2013-03-08 LAB — CANCER ANTIGEN 27.29: CA 27.29: 25 U/mL (ref 0–39)

## 2013-03-08 MED ORDER — SODIUM CHLORIDE 0.9 % IJ SOLN
10.0000 mL | INTRAMUSCULAR | Status: DC | PRN
  Administered 2013-03-08: 10 mL via INTRAVENOUS
  Filled 2013-03-08: qty 10

## 2013-03-08 MED ORDER — HEPARIN SOD (PORK) LOCK FLUSH 100 UNIT/ML IV SOLN
500.0000 [IU] | Freq: Once | INTRAVENOUS | Status: AC
  Administered 2013-03-08: 500 [IU] via INTRAVENOUS
  Filled 2013-03-08: qty 5

## 2013-03-08 NOTE — Telephone Encounter (Signed)
, °

## 2013-03-08 NOTE — Progress Notes (Signed)
OFFICE PROGRESS NOTE   03/08/2013   Physicians: Hazle Quant, Don Murinson), D.ClarkePearson, C.Streck, P.Kwaitkowski, J.Hayes, W.Whitworth (derm), S.Bowie  INTERVAL HISTORY:  Patient is a very pleasant 72 yo lady whom I am seeing for the first time today, transferring medical oncology care from Dr Arline Asp since his retirement, on observation thru this office for history of IIIC ovarian cancer diagnosed 1998 and recurrent in 2008, intraductal carcinoma left breast 1992, and BRCA 1 mutation.  History is from two most recent 2 EMRs and from patient. Primary care is by Dr Frederica Kuster. She sees Dr Greta Doom yearly, has not seen Dr Yolande Jolly in several years.Last right mammogram was at [facility] 03-07-2013; she has not had breast MRI. Last imaging abdomen/ pelvis was with PET 02-2012. Last colonoscopy by Dr Dorena Cookey was within the past year, reportedly good. Patient has felt very well since she saw Dr Arline Asp last 6 months ago. Energy is good, appetite great, no abdominal or pelvic discomfort, no noted changes in breasts, no recent infectious illness. She does have residual neuropathy in feet from taxol, which bothers her less when she is up walking. She has PAC in which was placed by Dr Jamey Ripa in 2008, this kept flushed at [facility] every 8 weeks. The PAC is not bothersome to her and she prefers to keep it in place in case it is needed again later.    ONCOLOGIC HISTORY Patient had intraductal carcinoma of left breast in Sept 1992, with left modified radical mastectomy and 13 node axillary dissection (all negative) by Dr Francina Ames, and reconstruction. She was intolerant of tamoxifen. She has a subpectoral implant on right. 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 She had recurrence in March 2008, with surgery  then by Dr Jamey Ripa for 4 cm mass along colon and left pelvic node. (That pathology was ER PR negative.) Patient recalls that only symptom of recurrence was burning discomfort in LLQ. She had additional 6 cycles of taxol carboplatin from April - July 2008 and has had no known active disease since then. Per patient, ca 2729 marker was elevated with the recurrent ovarian cancer, with normal ca125.   Review of systems as above, also:  Occasional HA longstanding and unchanged, uses prn Fiorinal. Reading glasses. No active dental problems. No significant sinus symptoms. No difficulty hearing. No thyroid problems, no respiratory or cardiac problems.Bowels move several times daily since she has begun vitamin which includes magnesium (discussed). Occasional discomfort left hip, noticeable again in last 24 hours as she has had in past. No arthritis. No LE swelling. No bladder problems. No bleeding. Chronic mild lymphedema around elbow LUE. Remainder of 10 point Review of Systems negative.  She has upcoming visit already scheduled with dermatology.  Past Medical/ Surgical History Allergy codeine and promethazine Intraductal carcinoma left breast 1992: mastectomy with axillary node dissection (13 nodes) , intolerant to tamoxifen Left breast reconstruction and right breast augmentation BRCA1 positive IIIC ovarian cancer 07-1996, recurrent 07-2006, initial surgery and second look laparotomy, initial 6 cycles of taxol carboplatin followed by topotecan, the another 6 cycles of taxol carboplatin 2008. cholecystectomy Benign thyroid biopsy Unremarkable colonoscopy 03-08-2012 PAC in by Dr Jamey Ripa 2008  Family History Mother with melanoma, died of ovarian cancer at 34 2 daughters healthy, neither has wanted BRCA testing 2 grandsons healthy Multiple other family members with cancer, tho  patient declined to elaborate now.   Social History From [employer]. Retired from clinical counseling. Divorced. 1 daughter  locally and 1 daughter in [place]. Past tobacco, quit 1980s.   Objective:  Vital signs in last 24 hours:  BP 134/71  Pulse 72  Resp 18  Ht 5\' 7"  (1.702 m)  Wt 166 lb 9.6 oz (75.569 kg)  BMI 26.09 kg/m2  SpO2 97% Weight is up 2 lbs. Alert, oriented and appropriate. Ambulatory without difficulty.   HEENT:PERRL, sclerae not icteric. Oral mucosa moist without lesions, posterior pharynx clear.  Neck supple. No JVD.  Lymphatics:no cervical,suraclavicular, axillary or inguinal adenopathy Resp: clear to auscultation bilaterally and normal percussion bilaterally Cardio: regular rate and rhythm. No gallop. GI: soft, nontender, not distended, no mass or organomegaly. Normally active bowel sounds. Midline surgical incision not remarkable, RUQ scar unremarkable Musculoskeletal/ Extremities: without pitting edema, cords, tenderness. LUE with 1+ lymphedema primarily at elbow, none in forearm or hand. No erythema or concern for cellulitis  Back nontender. Neuro: peripheral neuropathy soles of feet bilaterally. Some difficulty with names and details of history, otherwise nonfocal Skin without rash, ecchymosis, petechiae. 1.5 cm erythematous area right shoulder posteriorly suggestive of nonmelanoma skin cancer. Breasts:reconstructed left breast without evidence of local recurrence. Right breast with well healed scars from augmentation surgery, no dominant mass, no skin or nipple findings of concern. Marland Kitchen Axillae benign. Portacath-without erythema or tenderness  Lab Results:  Results for orders placed in visit on 03/08/13  COMPREHENSIVE METABOLIC PANEL (CC13)      Result Value Range   Sodium 142  136 - 145 mEq/L   Potassium 3.7  3.5 - 5.1 mEq/L   Chloride 105  98 - 109 mEq/L   CO2 26  22 - 29 mEq/L   Glucose 112  70 - 140 mg/dl   BUN 16.1  7.0 - 09.6 mg/dL   Creatinine 0.8  0.6 - 1.1 mg/dL   Total Bilirubin 0.45  0.20 - 1.20 mg/dL   Alkaline Phosphatase 80  40 - 150 U/L   AST 17  5 - 34 U/L    ALT 22  0 - 55 U/L   Total Protein 6.9  6.4 - 8.3 g/dL   Albumin 3.3 (*) 3.5 - 5.0 g/dL   Calcium 9.6  8.4 - 40.9 mg/dL   Anion Gap 11  3 - 11 mEq/L  CBC WITH DIFFERENTIAL      Result Value Range   WBC 5.8  3.9 - 10.3 10e3/uL   NEUT# 3.9  1.5 - 6.5 10e3/uL   HGB 15.1  11.6 - 15.9 g/dL   HCT 81.1  91.4 - 78.2 %   Platelets 205  145 - 400 10e3/uL   MCV 93.9  79.5 - 101.0 fL   MCH 32.0  25.1 - 34.0 pg   MCHC 34.1  31.5 - 36.0 g/dL   RBC 9.56  2.13 - 0.86 10e6/uL   RDW 12.6  11.2 - 14.5 %   lymph# 1.3  0.9 - 3.3 10e3/uL   MONO# 0.4  0.1 - 0.9 10e3/uL   Eosinophils Absolute 0.1  0.0 - 0.5 10e3/uL   Basophils Absolute 0.0  0.0 - 0.1 10e3/uL   NEUT% 67.0  38.4 - 76.8 %   LYMPH% 22.6  14.0 - 49.7 %   MONO% 7.5  0.0 - 14.0 %   EOS% 2.3  0.0 - 7.0 %   BASO% 0.6  0.0 - 2.0 %   CA 125     5.6  CA 2729    25, this having been 24 in 08-2012, 25 in 02-2012, 26 in 10-2011.  Studies/Results: DIGITAL SCREENING UNILATERAL RIGHT MAMMOGRAM WITH CAD 03-07-2013 The patient has implant. Standard and implant displaced views were  performed.  COMPARISON: Previous exam(s)  ACR Breast Density Category 2: Benign Finding(s)  FINDINGS:  There are no findings suspicious for malignancy. Images were  processed with CAD.  IMPRESSION:  No mammographic evidence of malignancy. A result letter of this  screening mammogram will be mailed directly to the patient   Medications: I have reviewed the patient's current medications. She has never had flu vaccine and declines this today.  DISCUSSION: we have discussed breast MRI imaging due to BRCA +, however patient feels that she is not concerned about breast cancer and is not interested in MRI at this point. We have discussed radiation exposure with PET and fact that she is clinically doing very well; we have decided to see results of labs pending today as next step. I have told her that I would be comfortable following without PET this fall as long as labs good  also. We can certainly schedule a follow up visit with Dr Yolande Jolly if she would like his thoughts on this also.  Assessment/Plan:  1. Ovarian carcinoma: initially diagnosed 76 and recurrent 2008, treatment as above, clinically doing well. I will see her in 6 months, or sooner if needed; she has yearly exams by gyn also. I would have low threshold for imaging with CT or possibly PET if concerns, but may not need this on routine basis now. 2.intraductal carcinoma left breast 1992. MRI would be appropriate additionally due to BRCA status, if patient agrees. 3.BRCA 1 abnormality 4.past tobacco, DCd 1980s 5.neuropathy feet residual from taxol: stable PAC in, which will be flushed every 6-8 weeks when not otherwise used 6.declines flu vaccine 7.mild lymphedema LUE: saw lymphedema PT in past. Stable and not bothersome. 8.skin lesion right posterior shoulder: dermatology to evaluate  Patient is comfortable with discussion and plan. We will be in touch with her about lab results from visit today.   Sylis Ketchum P, MD   03/08/2013, 7:57 PM

## 2013-03-09 ENCOUNTER — Other Ambulatory Visit: Payer: Medicare Other | Admitting: Lab

## 2013-03-09 ENCOUNTER — Ambulatory Visit: Payer: Medicare Other

## 2013-03-10 ENCOUNTER — Telehealth: Payer: Self-pay

## 2013-03-10 NOTE — Telephone Encounter (Signed)
Told [name] her lab results as noted below by Dr. Darrold Span. [name] said that she can view her lab results on MyChart.

## 2013-03-10 NOTE — Telephone Encounter (Signed)
Message copied by Lorine Bears on Fri Mar 10, 2013  4:36 PM ------      Message from: Jama Flavors P      Created: Thu Mar 09, 2013  7:51 PM       Labs seen and need follow up: please let her know CBC, chemistries and both markers (CA125 and CA2729) are fine! She should be able to see these on MyChart or ok to get copies to her if she prefers      Cc LA, TH ------

## 2013-03-10 NOTE — Telephone Encounter (Signed)
Message copied by Lorine Bears on Fri Mar 10, 2013  4:39 PM ------      Message from: Reece Packer      Created: Thu Mar 09, 2013  7:51 PM       Labs seen and need follow up: please let her know CBC, chemistries and both markers (CA125 and CA2729) are fine! She should be able to see these on MyChart or ok to get copies to her if she prefers      Cc LA, TH ------

## 2013-03-20 ENCOUNTER — Other Ambulatory Visit: Payer: Self-pay | Admitting: Dermatology

## 2013-03-20 DIAGNOSIS — D485 Neoplasm of uncertain behavior of skin: Secondary | ICD-10-CM | POA: Diagnosis not present

## 2013-03-20 DIAGNOSIS — L821 Other seborrheic keratosis: Secondary | ICD-10-CM | POA: Diagnosis not present

## 2013-03-20 DIAGNOSIS — L570 Actinic keratosis: Secondary | ICD-10-CM | POA: Diagnosis not present

## 2013-03-20 DIAGNOSIS — L820 Inflamed seborrheic keratosis: Secondary | ICD-10-CM | POA: Diagnosis not present

## 2013-03-30 ENCOUNTER — Other Ambulatory Visit: Payer: Self-pay

## 2013-04-10 ENCOUNTER — Encounter: Payer: Self-pay | Admitting: Internal Medicine

## 2013-04-10 ENCOUNTER — Ambulatory Visit (INDEPENDENT_AMBULATORY_CARE_PROVIDER_SITE_OTHER): Payer: Medicare Other | Admitting: Internal Medicine

## 2013-04-10 VITALS — BP 120/68 | HR 78 | Temp 98.0°F | Resp 20 | Wt 169.0 lb

## 2013-04-10 DIAGNOSIS — R220 Localized swelling, mass and lump, head: Secondary | ICD-10-CM | POA: Diagnosis not present

## 2013-04-10 DIAGNOSIS — Z8543 Personal history of malignant neoplasm of ovary: Secondary | ICD-10-CM

## 2013-04-10 DIAGNOSIS — Z853 Personal history of malignant neoplasm of breast: Secondary | ICD-10-CM | POA: Diagnosis not present

## 2013-04-10 NOTE — Patient Instructions (Signed)
Call or return to clinic prn if these symptoms worsen or fail to improve as anticipated.  Oncology followup

## 2013-04-10 NOTE — Progress Notes (Signed)
Subjective:    Patient ID: [name], female    DOB: 1940-06-23, 72 y.o.   MRN: [id number]  HPI 72 year old patient who has an extensive oncological history with ovarian and breast cancer she was seen by oncology in followup last month. For the past week she has noted a small nodule involving the right posterior neck area.  This has not changed since she first noticed this about one week ago. She feels quite well.  Past Medical History  Diagnosis Date  . Ovarian cancer     March 1998  . DCIS (ductal carcinoma in situ) of breast   . Unspecified vitamin D deficiency     History   Social History  . Marital Status: Divorced    Spouse Name: N/A    Number of Children: N/A  . Years of Education: N/A   Occupational History  . Not on file.   Social History Main Topics  . Smoking status: Former Smoker    Quit date: 05/26/1991  . Smokeless tobacco: Never Used  . Alcohol Use: Yes  . Drug Use: No  . Sexual Activity: Not on file   Other Topics Concern  . Not on file   Social History Narrative   March 1998- history of stage III ovarian cancer; status post suboptimal debulking; status post 6 cycles of Taxoil and carboplatin with complete remission documented by second look surgery September 1998.      Recurrent ovarian cancer, status post 6 cycles carbo and Taxol, April 1998 until July 2008      1992, left breast intraductal carcinoma (DCIS)   Status post left modified radial mastectomy and with silicon implant   13 of 13 lymph modes negative; positive for BCRA1 gene   Recurrent ovarian carinoma, February 2008   Admit Feb. 2009 partial small bowel obstruction treated medically    Past Surgical History  Procedure Laterality Date  . Biopsy thyroid      for benign disease  . Colonscopy  2004    2/08  . Left ankle fracture      status post surgery, March 2010  . Cholecystectomy      Family History  Problem Relation Age of Onset  . Cancer Mother     Corky Mull, historyof  melanoma   . Aneurysm Father     abdominal aortic  . Heart disease Father   . Diverticulitis Brother     Allergies  Allergen Reactions  . Codeine Sulfate     REACTION: unspecified  . Promethazine Hcl     REACTION: unspecified    Current Outpatient Prescriptions on File Prior to Visit  Medication Sig Dispense Refill  . b complex vitamins tablet Take 1 tablet by mouth daily.        . Calcium Carbonate-Vitamin D (CALTRATE 600+D) 600-400 MG-UNIT per chew tablet Chew 1 tablet by mouth daily.        . Cholecalciferol (VITAMIN D) 1000 UNITS capsule Take 1,000 Units by mouth daily.        . Coenzyme Q10 (CO Q 10) 10 MG CAPS Take by mouth daily.        Marland Kitchen ECHINACEA PO Take 1 tablet by mouth every other day.      . estradiol (ESTRACE) 1 MG tablet Take 1 mg by mouth daily.        . fish oil-omega-3 fatty acids 1000 MG capsule Take 2 g by mouth daily.        . Glucosamine 500 MG TABS Take 1,500  mg by mouth daily.      Marland Kitchen levothyroxine (SYNTHROID, LEVOTHROID) 88 MCG tablet TAKE 1 TABLET EVERY DAY  90 tablet  3  . Melatonin 1 MG CAPS Take by mouth at bedtime as needed.      . multivitamin (THERAGRAN) per tablet Take 1 tablet by mouth daily.        . vitamin C (ASCORBIC ACID) 500 MG tablet Take 500 mg by mouth daily.       No current facility-administered medications on file prior to visit.    BP 120/68  Pulse 78  Temp(Src) 98 F (36.7 C) (Oral)  Resp 20  Wt 169 lb (76.658 kg)  SpO2 97%         Review of Systems  Constitutional: Negative.   HENT: Negative for congestion, dental problem, hearing loss, rhinorrhea, sinus pressure, sore throat and tinnitus.   Eyes: Negative for pain, discharge and visual disturbance.  Respiratory: Negative for cough and shortness of breath.   Cardiovascular: Negative for chest pain, palpitations and leg swelling.  Gastrointestinal: Negative for nausea, vomiting, abdominal pain, diarrhea, constipation, blood in stool and abdominal distention.   Genitourinary: Negative for dysuria, urgency, frequency, hematuria, flank pain, vaginal bleeding, vaginal discharge, difficulty urinating, vaginal pain and pelvic pain.  Musculoskeletal: Negative for arthralgias, gait problem and joint swelling.  Skin: Negative for rash.  Neurological: Negative for dizziness, syncope, speech difficulty, weakness, numbness and headaches.  Hematological: Negative for adenopathy.  Psychiatric/Behavioral: Negative for behavioral problems, dysphoric mood and agitation. The patient is not nervous/anxious.        Objective:   Physical Exam  Constitutional: She is oriented to person, place, and time. She appears well-developed and well-nourished.  HENT:  Head: Normocephalic.  Right Ear: External ear normal.  Left Ear: External ear normal.  Mouth/Throat: Oropharynx is clear and moist.  Eyes: Conjunctivae and EOM are normal. Pupils are equal, round, and reactive to light.  Neck: Normal range of motion. Neck supple. No thyromegaly present.  A 1 cm subcutaneous nodule at involving the left posterior neck area. This was consistent with a small sebaceous cyst No obvious cervical adenopathy noted  Cardiovascular: Normal rate, regular rhythm, normal heart sounds and intact distal pulses.   Pulmonary/Chest: Effort normal and breath sounds normal.  Abdominal: Soft. Bowel sounds are normal. She exhibits no mass. There is no tenderness.  Musculoskeletal: Normal range of motion.  Lymphadenopathy:    She has no cervical adenopathy.  Neurological: She is alert and oriented to person, place, and time.  Skin: Skin is warm and dry. No rash noted.  Psychiatric: She has a normal mood and affect. Her behavior is normal.          Assessment & Plan:  Probable sebaceous cyst left posterior neck area. This will be observed over a short period of time and if there is any enlargement or clinical change we'll set up for excisional biopsy Oncology followup

## 2013-05-04 ENCOUNTER — Other Ambulatory Visit: Payer: Self-pay | Admitting: Gynecology

## 2013-05-04 DIAGNOSIS — Z1212 Encounter for screening for malignant neoplasm of rectum: Secondary | ICD-10-CM | POA: Diagnosis not present

## 2013-05-04 DIAGNOSIS — Z1289 Encounter for screening for malignant neoplasm of other sites: Secondary | ICD-10-CM | POA: Diagnosis not present

## 2013-05-04 DIAGNOSIS — Z8540 Personal history of malignant neoplasm of unspecified female genital organ: Secondary | ICD-10-CM | POA: Diagnosis not present

## 2013-05-04 DIAGNOSIS — Z01419 Encounter for gynecological examination (general) (routine) without abnormal findings: Secondary | ICD-10-CM | POA: Diagnosis not present

## 2013-05-08 ENCOUNTER — Ambulatory Visit (HOSPITAL_BASED_OUTPATIENT_CLINIC_OR_DEPARTMENT_OTHER): Payer: Medicare Other

## 2013-05-08 ENCOUNTER — Other Ambulatory Visit: Payer: Medicare Other

## 2013-05-08 VITALS — BP 113/53 | Temp 97.3°F | Resp 20

## 2013-05-08 DIAGNOSIS — C569 Malignant neoplasm of unspecified ovary: Secondary | ICD-10-CM | POA: Diagnosis not present

## 2013-05-08 DIAGNOSIS — Z452 Encounter for adjustment and management of vascular access device: Secondary | ICD-10-CM

## 2013-05-08 DIAGNOSIS — Z95828 Presence of other vascular implants and grafts: Secondary | ICD-10-CM

## 2013-05-08 MED ORDER — HEPARIN SOD (PORK) LOCK FLUSH 100 UNIT/ML IV SOLN
500.0000 [IU] | Freq: Once | INTRAVENOUS | Status: AC
  Administered 2013-05-08: 500 [IU] via INTRAVENOUS
  Filled 2013-05-08: qty 5

## 2013-05-08 MED ORDER — SODIUM CHLORIDE 0.9 % IJ SOLN
10.0000 mL | INTRAMUSCULAR | Status: DC | PRN
  Administered 2013-05-08: 10 mL via INTRAVENOUS
  Filled 2013-05-08: qty 10

## 2013-05-08 NOTE — Patient Instructions (Signed)

## 2013-05-29 ENCOUNTER — Telehealth: Payer: Self-pay | Admitting: *Deleted

## 2013-05-29 NOTE — Telephone Encounter (Signed)
sw gv appt for 09/11/13@ 3pm. Pt is aware...td

## 2013-06-30 ENCOUNTER — Telehealth: Payer: Self-pay | Admitting: Oncology

## 2013-06-30 NOTE — Telephone Encounter (Signed)
returned pt call adn advised that i changed appt to 12:30 only time available

## 2013-07-10 ENCOUNTER — Ambulatory Visit (HOSPITAL_BASED_OUTPATIENT_CLINIC_OR_DEPARTMENT_OTHER): Payer: Medicare Other

## 2013-07-10 ENCOUNTER — Other Ambulatory Visit: Payer: Medicare Other

## 2013-07-10 VITALS — BP 150/59 | HR 69 | Temp 97.0°F

## 2013-07-10 DIAGNOSIS — Z452 Encounter for adjustment and management of vascular access device: Secondary | ICD-10-CM

## 2013-07-10 DIAGNOSIS — C569 Malignant neoplasm of unspecified ovary: Secondary | ICD-10-CM | POA: Diagnosis not present

## 2013-07-10 MED ORDER — SODIUM CHLORIDE 0.9 % IJ SOLN
10.0000 mL | INTRAMUSCULAR | Status: DC | PRN
  Administered 2013-07-10: 10 mL via INTRAVENOUS
  Filled 2013-07-10: qty 10

## 2013-07-10 MED ORDER — HEPARIN SOD (PORK) LOCK FLUSH 100 UNIT/ML IV SOLN
500.0000 [IU] | Freq: Once | INTRAVENOUS | Status: AC
  Administered 2013-07-10: 500 [IU] via INTRAVENOUS
  Filled 2013-07-10: qty 5

## 2013-07-10 NOTE — Patient Instructions (Signed)

## 2013-08-16 ENCOUNTER — Other Ambulatory Visit: Payer: Self-pay | Admitting: Internal Medicine

## 2013-09-09 ENCOUNTER — Other Ambulatory Visit: Payer: Self-pay | Admitting: Oncology

## 2013-09-11 ENCOUNTER — Encounter: Payer: Self-pay | Admitting: Oncology

## 2013-09-11 ENCOUNTER — Ambulatory Visit (HOSPITAL_BASED_OUTPATIENT_CLINIC_OR_DEPARTMENT_OTHER): Payer: Medicare Other | Admitting: Oncology

## 2013-09-11 ENCOUNTER — Ambulatory Visit: Payer: Medicare Other

## 2013-09-11 ENCOUNTER — Other Ambulatory Visit (HOSPITAL_BASED_OUTPATIENT_CLINIC_OR_DEPARTMENT_OTHER): Payer: Medicare Other

## 2013-09-11 ENCOUNTER — Other Ambulatory Visit: Payer: Self-pay

## 2013-09-11 ENCOUNTER — Telehealth: Payer: Self-pay | Admitting: Oncology

## 2013-09-11 VITALS — BP 122/70 | HR 67 | Temp 97.9°F | Resp 18 | Ht 67.0 in | Wt 160.8 lb

## 2013-09-11 DIAGNOSIS — C50919 Malignant neoplasm of unspecified site of unspecified female breast: Secondary | ICD-10-CM | POA: Diagnosis not present

## 2013-09-11 DIAGNOSIS — I890 Lymphedema, not elsewhere classified: Secondary | ICD-10-CM

## 2013-09-11 DIAGNOSIS — Z95828 Presence of other vascular implants and grafts: Secondary | ICD-10-CM

## 2013-09-11 DIAGNOSIS — R1084 Generalized abdominal pain: Secondary | ICD-10-CM

## 2013-09-11 DIAGNOSIS — Z853 Personal history of malignant neoplasm of breast: Secondary | ICD-10-CM

## 2013-09-11 DIAGNOSIS — C569 Malignant neoplasm of unspecified ovary: Secondary | ICD-10-CM

## 2013-09-11 DIAGNOSIS — G609 Hereditary and idiopathic neuropathy, unspecified: Secondary | ICD-10-CM

## 2013-09-11 DIAGNOSIS — Z1231 Encounter for screening mammogram for malignant neoplasm of breast: Secondary | ICD-10-CM

## 2013-09-11 DIAGNOSIS — Z1501 Genetic susceptibility to malignant neoplasm of breast: Secondary | ICD-10-CM

## 2013-09-11 DIAGNOSIS — C50912 Malignant neoplasm of unspecified site of left female breast: Secondary | ICD-10-CM

## 2013-09-11 DIAGNOSIS — Z8543 Personal history of malignant neoplasm of ovary: Secondary | ICD-10-CM

## 2013-09-11 DIAGNOSIS — Z1509 Genetic susceptibility to other malignant neoplasm: Secondary | ICD-10-CM

## 2013-09-11 LAB — CBC WITH DIFFERENTIAL/PLATELET
BASO%: 0.4 % (ref 0.0–2.0)
BASOS ABS: 0 10*3/uL (ref 0.0–0.1)
EOS%: 3.1 % (ref 0.0–7.0)
Eosinophils Absolute: 0.2 10*3/uL (ref 0.0–0.5)
HCT: 43 % (ref 34.8–46.6)
HEMOGLOBIN: 14.6 g/dL (ref 11.6–15.9)
LYMPH#: 1.1 10*3/uL (ref 0.9–3.3)
LYMPH%: 21.2 % (ref 14.0–49.7)
MCH: 32.2 pg (ref 25.1–34.0)
MCHC: 34 g/dL (ref 31.5–36.0)
MCV: 94.9 fL (ref 79.5–101.0)
MONO#: 0.5 10*3/uL (ref 0.1–0.9)
MONO%: 8.8 % (ref 0.0–14.0)
NEUT%: 66.5 % (ref 38.4–76.8)
NEUTROS ABS: 3.5 10*3/uL (ref 1.5–6.5)
Platelets: 183 10*3/uL (ref 145–400)
RBC: 4.53 10*6/uL (ref 3.70–5.45)
RDW: 12.7 % (ref 11.2–14.5)
WBC: 5.2 10*3/uL (ref 3.9–10.3)

## 2013-09-11 LAB — COMPREHENSIVE METABOLIC PANEL (CC13)
ALBUMIN: 3.6 g/dL (ref 3.5–5.0)
ALT: 19 U/L (ref 0–55)
AST: 16 U/L (ref 5–34)
Alkaline Phosphatase: 74 U/L (ref 40–150)
Anion Gap: 8 mEq/L (ref 3–11)
BUN: 14 mg/dL (ref 7.0–26.0)
CALCIUM: 9.5 mg/dL (ref 8.4–10.4)
CO2: 23 mEq/L (ref 22–29)
Chloride: 110 mEq/L — ABNORMAL HIGH (ref 98–109)
Creatinine: 0.8 mg/dL (ref 0.6–1.1)
GLUCOSE: 100 mg/dL (ref 70–140)
POTASSIUM: 3.7 meq/L (ref 3.5–5.1)
SODIUM: 141 meq/L (ref 136–145)
TOTAL PROTEIN: 6.9 g/dL (ref 6.4–8.3)
Total Bilirubin: 0.36 mg/dL (ref 0.20–1.20)

## 2013-09-11 MED ORDER — SODIUM CHLORIDE 0.9 % IJ SOLN
10.0000 mL | INTRAMUSCULAR | Status: DC | PRN
  Administered 2013-09-11: 10 mL via INTRAVENOUS
  Filled 2013-09-11: qty 10

## 2013-09-11 MED ORDER — HEPARIN SOD (PORK) LOCK FLUSH 100 UNIT/ML IV SOLN
500.0000 [IU] | Freq: Once | INTRAVENOUS | Status: AC
  Administered 2013-09-11: 500 [IU] via INTRAVENOUS
  Filled 2013-09-11: qty 5

## 2013-09-11 NOTE — Progress Notes (Signed)
OFFICE PROGRESS NOTE   09/11/2013   Physicians:(David Sillman, Timmothy Sours Murinson), D.ClarkePearson, C.Streck, P.Kwaitkowski, J.Hayes, W.Whitworth (derm), S.Bowie   INTERVAL HISTORY:  Patient is seen, alone for visit, in scheduled 6 month follow up of BRCA 1 mutation with history of ovarian and breast cancers. She is presently on observation for these problems. Last right mammogram was at [facility] 03-07-13; she has not had breast MRI since 2008, tho she had at least yearly PET scans from 2008 thru 02-2012. She does not have regular follow up with Dr Josephina Shih now, but sees Dr Gertie Fey yearly after mammograms in fall.  She has PAC, this placed by Dr Margot Chimes in 2008 and flushed at this office every 8 weeks; patient has preferred to keep the [facility].  She has felt very well since she was here last, with no complaints that seem referable to the cancer history.  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 She was intolerant of tamoxifen. She has a subpectoral implant on right.  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 She had recurrence in March 2008, with surgery then by Dr Margot Chimes for 4 cm mass along colon and left pelvic node. (That pathology was ER PR negative.) Patient recalls that only symptom of recurrence was burning discomfort in LLQ. She had additional 6 cycles of taxol carboplatin from April - July 2008 and has had no known active disease since then. Per patient, ca 2729 marker was elevated with the recurrent ovarian cancer, with normal ca125.     Review of systems as above, also: Apparent sebaceous cyst right posterior neck smaller, not tender. No  fever or symptoms of infection. Good energy, good appetite. No noted changes in breast self exam. No abdominal or pelvic pain. Bowels moving regularly. No different bladder symptoms. No swelling LE. No bleeding. No new or different pain. Remainder of 10 point Review of Systems negative.  Objective:  Vital signs in last 24 hours:  BP 122/70  Pulse 67  Temp(Src) 97.9 F (36.6 C) (Oral)  Resp 18  Ht '5\' 7"'  (1.702 m)  Wt 160 lb 12.8 oz (72.938 kg)  BMI 25.18 kg/m2 Weight is down 6 lbs Alert, oriented and appropriate. Ambulatory without difficulty.   HEENT:PERRL, sclerae not icteric. Oral mucosa moist without lesions, posterior pharynx clear.  Neck supple. No JVD.  Lymphatics:no cervical,suraclavicular, axillary or inguinal adenopathy. Smooth, slightly oblong 0.6 x 0.4 mm subcutaneous nodule posterior lower right neck laterally, mobile, not firm, not tender. Resp: clear to auscultation bilaterally and normal percussion bilaterally Cardio: regular rate and rhythm. No gallop. GI: soft, nontender, not distended, no mass or organomegaly. Normally active bowel sounds. Surgical incision not remarkable. Musculoskeletal/ Extremities: without pitting edema, cords, tenderness Neuro: no peripheral neuropathy. Otherwise nonfocal Skin without rash, ecchymosis, petechiae Breasts: reconstructed left breast without evidence of local recurrence right with subpectoral implant, no dominant mass, skin or nipple findings. Axillae benign. Portacath-without erythema or tenderness, flushed today.  Lab Results:  Results for orders placed in visit on 09/11/13  CBC WITH DIFFERENTIAL      Result Value Ref Range   WBC 5.2  3.9 - 10.3 10e3/uL   NEUT# 3.5  1.5 - 6.5 10e3/uL   HGB 14.6  11.6 - 15.9 g/dL   HCT 43.0  34.8 - 46.6 %   Platelets 183  145 - 400 10e3/uL   MCV 94.9  79.5 - 101.0 fL   MCH 32.2  25.1 - 34.0 pg   MCHC 34.0  31.5 - 36.0 g/dL   RBC 4.53  3.70 - 5.45 10e6/uL   RDW 12.7  11.2 - 14.5 %    lymph# 1.1  0.9 - 3.3 10e3/uL   MONO# 0.5  0.1 - 0.9 10e3/uL   Eosinophils Absolute 0.2  0.0 - 0.5 10e3/uL   Basophils Absolute 0.0  0.0 - 0.1 10e3/uL   NEUT% 66.5  38.4 - 76.8 %   LYMPH% 21.2  14.0 - 49.7 %   MONO% 8.8  0.0 - 14.0 %   EOS% 3.1  0.0 - 7.0 %   BASO% 0.4  0.0 - 2.0 %  COMPREHENSIVE METABOLIC PANEL (AY30)      Result Value Ref Range   Sodium 141  136 - 145 mEq/L   Potassium 3.7  3.5 - 5.1 mEq/L   Chloride 110 (*) 98 - 109 mEq/L   CO2 23  22 - 29 mEq/L   Glucose 100  70 - 140 mg/dl   BUN 14.0  7.0 - 26.0 mg/dL   Creatinine 0.8  0.6 - 1.1 mg/dL   Total Bilirubin 0.36  0.20 - 1.20 mg/dL   Alkaline Phosphatase 74  40 - 150 U/L   AST 16  5 - 34 U/L   ALT 19  0 - 55 U/L   Total Protein 6.9  6.4 - 8.3 g/dL   Albumin 3.6  3.5 - 5.0 g/dL   Calcium 9.5  8.4 - 10.4 mg/dL   Anion Gap 8  3 - 11 mEq/L    Markers available after visit CA125  5.6 and CA 2729   22.  Studies/Results:  No results found.  Medications: I have reviewed the patient's current medications. She has used Estrace tablet 1 mg daily for hot flashes prior breast cancer diagnosis, then off this from [facility] when she resumed. This prescription comes from Dr Gertie Fey. Patient states that she tried clonidine, black cohosh and multiple other agents without benefit during break off of the estrace; she has not tried any break off of this in a long time. Patient feels that, since her cancers were hormone receptor negative, benefits for hot flashes are acceptable over risks. I have suggested she try again off the medication, or at least use minimal possible to control symptoms.  DISCUSSION : we have discussed MRI breasts within 3 months after mammograms this year. This will image under reconstruction and implant, including internal mammary nodes, and will not give radiation exposure. We have discussed radiation exposure from other scans including PET. She seems to be open to breast MRI and we will discuss again/ schedule  when I see her in 6 months  Assessment/Plan:  1. Ovarian carcinoma: initially diagnosed 68 and recurrent 2008, treatment as above, clinically doing well. I will see her in 6 months, or sooner if needed; she has yearly exams by gyn also. I would have low threshold for imaging with CT or possibly PET if concerns, but may not need this on routine basis now.  2.intraductal carcinoma left breast 1992. MRI would be appropriate additionally due to BRCA status, if patient agrees.  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 Stable and not bothersome.    She knows to call prior to scheduled visit if needed. Time spent 25 min including >50% counseling and coordination of care   Gordy Levan, MD   09/11/2013, 5:05 PM

## 2013-09-11 NOTE — Patient Instructions (Signed)

## 2013-09-11 NOTE — Telephone Encounter (Signed)
, °

## 2013-09-12 ENCOUNTER — Telehealth: Payer: Self-pay | Admitting: *Deleted

## 2013-09-12 LAB — CANCER ANTIGEN 27.29: CA 27.29: 22 U/mL (ref 0–39)

## 2013-09-12 LAB — CA 125: CA 125: 5.8 U/mL (ref 0.0–30.2)

## 2013-09-12 NOTE — Telephone Encounter (Signed)
Pt given results below

## 2013-09-12 NOTE — Telephone Encounter (Signed)
Message copied by Patton Salles on Tue Sep 12, 2013  3:42 PM ------      Message from: Gordy Levan      Created: Tue Sep 12, 2013  3:06 PM       Labs seen and need follow EF:[id number] let her know CA125 and [id number] both fine yesterday ------

## 2013-09-21 DIAGNOSIS — L819 Disorder of pigmentation, unspecified: Secondary | ICD-10-CM | POA: Diagnosis not present

## 2013-09-21 DIAGNOSIS — L820 Inflamed seborrheic keratosis: Secondary | ICD-10-CM | POA: Diagnosis not present

## 2013-09-21 DIAGNOSIS — L570 Actinic keratosis: Secondary | ICD-10-CM | POA: Diagnosis not present

## 2013-09-21 DIAGNOSIS — D1801 Hemangioma of skin and subcutaneous tissue: Secondary | ICD-10-CM | POA: Diagnosis not present

## 2013-09-25 ENCOUNTER — Ambulatory Visit (INDEPENDENT_AMBULATORY_CARE_PROVIDER_SITE_OTHER): Payer: Medicare Other | Admitting: Internal Medicine

## 2013-09-25 ENCOUNTER — Encounter: Payer: Self-pay | Admitting: Internal Medicine

## 2013-09-25 VITALS — BP 110/64 | HR 66 | Temp 98.1°F | Resp 20 | Ht 67.0 in | Wt 161.0 lb

## 2013-09-25 DIAGNOSIS — N390 Urinary tract infection, site not specified: Secondary | ICD-10-CM

## 2013-09-25 DIAGNOSIS — R3915 Urgency of urination: Secondary | ICD-10-CM | POA: Diagnosis not present

## 2013-09-25 LAB — POCT URINALYSIS DIPSTICK
BILIRUBIN UA: NEGATIVE
GLUCOSE UA: NEGATIVE
KETONES UA: NEGATIVE
NITRITE UA: NEGATIVE
PH UA: 5.5
Urobilinogen, UA: 0.2

## 2013-09-25 MED ORDER — FLUCONAZOLE 150 MG PO TABS: 150.0000 mg | ORAL_TABLET | Freq: Once | ORAL | Status: DC

## 2013-09-25 MED ORDER — CIPROFLOXACIN HCL 500 MG PO TABS: 500.0000 mg | ORAL_TABLET | Freq: Two times a day (BID) | ORAL | Status: DC

## 2013-09-25 NOTE — Progress Notes (Signed)
Subjective:    Patient ID: [name], female    DOB: 1940-11-30, 73 y.o.   MRN: [id number]  HPI  73 year old patient, who presents with a two-day history of dysuria and urinary frequency.  She has had a prior history of UTIs.  She states an antibiotic treatment is always associated with vaginal yeast infections.  Past Medical History  Diagnosis Date  . Ovarian cancer     March 1998  . DCIS (ductal carcinoma in situ) of breast   . Unspecified vitamin D deficiency     History   Social History  . Marital Status: Divorced    Spouse Name: N/A    Number of Children: N/A  . Years of Education: N/A   Occupational History  . Not on file.   Social History Main Topics  . Smoking status: Former Smoker    Quit date: 05/26/1991  . Smokeless tobacco: Never Used  . Alcohol Use: Yes  . Drug Use: No  . Sexual Activity: Not on file   Other Topics Concern  . Not on file   Social History Narrative   March 1998- history of stage III ovarian cancer; status post suboptimal debulking; status post 6 cycles of Taxoil and carboplatin with complete remission documented by second look surgery September 1998.      Recurrent ovarian cancer, status post 6 cycles carbo and Taxol, April 1998 until July 2008      1992, left breast intraductal carcinoma (DCIS)   Status post left modified radial mastectomy and with silicon implant   13 of 13 lymph modes negative; positive for BCRA1 gene   Recurrent ovarian carinoma, February 2008   Admit Feb. 2009 partial small bowel obstruction treated medically    Past Surgical History  Procedure Laterality Date  . Biopsy thyroid      for benign disease  . Colonscopy  2004    2/08  . Left ankle fracture      status post surgery, March 2010  . Cholecystectomy      Family History  Problem Relation Age of Onset  . Cancer Mother     [name], historyof melanoma   . Aneurysm Father     abdominal aortic  . Heart disease Father   . Diverticulitis  Brother     Allergies  Allergen Reactions  . Codeine Sulfate     REACTION: unspecified  . Promethazine Hcl     REACTION: unspecified    Current Outpatient Prescriptions on File Prior to Visit  Medication Sig Dispense Refill  . b complex vitamins tablet Take 1 tablet by mouth daily.        Marland Kitchen BIOTIN PO Take by mouth daily.      . Calcium Carbonate-Vitamin D (CALTRATE 600+D) 600-400 MG-UNIT per chew tablet Chew 1 tablet by mouth daily.        . Cholecalciferol (VITAMIN D) 1000 UNITS capsule Take 1,000 Units by mouth daily.        Marland Kitchen CINNAMON PO Take by mouth daily.      . Coenzyme Q10 (CO Q 10) 10 MG CAPS Take by mouth daily.        Marland Kitchen ECHINACEA PO Take 1 tablet by mouth every other day.      . estradiol (ESTRACE) 1 MG tablet Take 1 mg by mouth daily.        . fish oil-omega-3 fatty acids 1000 MG capsule Take 2 g by mouth daily.        . Glucosamine  500 MG TABS Take 1,500 mg by mouth daily.      Marland Kitchen levothyroxine (SYNTHROID, LEVOTHROID) 88 MCG tablet TAKE 1 TABLET EVERY DAY  90 tablet  1  . MAGNESIUM OXIDE PO Take by mouth every other day.      . Melatonin 1 MG CAPS Take by mouth at bedtime as needed.      . multivitamin (THERAGRAN) per tablet Take 1 tablet by mouth daily.        . vitamin C (ASCORBIC ACID) 500 MG tablet Take 500 mg by mouth daily.       No current facility-administered medications on file prior to visit.    BP 110/64  Pulse 66  Temp(Src) 98.1 F (36.7 C) (Oral)  Resp 20  Ht 5\' 7"  (1.702 m)  Wt 161 lb (73.029 kg)  BMI 25.21 kg/m2  SpO2 97%       Review of Systems  Constitutional: Negative.  Negative for fever and chills.  HENT: Negative for congestion, dental problem, hearing loss, rhinorrhea, sinus pressure, sore throat and tinnitus.   Eyes: Negative for pain, discharge and visual disturbance.  Respiratory: Negative for cough and shortness of breath.   Cardiovascular: Negative for chest pain, palpitations and leg swelling.  Gastrointestinal: Negative  for nausea, vomiting, abdominal pain, diarrhea, constipation, blood in stool and abdominal distention.  Genitourinary: Positive for dysuria and frequency. Negative for urgency, hematuria, flank pain, vaginal bleeding, vaginal discharge, difficulty urinating, vaginal pain and pelvic pain.  Musculoskeletal: Negative for arthralgias, gait problem and joint swelling.  Skin: Negative for rash.  Neurological: Negative for dizziness, syncope, speech difficulty, weakness, numbness and headaches.  Hematological: Negative for adenopathy.  Psychiatric/Behavioral: Negative for behavioral problems, dysphoric mood and agitation. The patient is not nervous/anxious.        Objective:   Physical Exam  Constitutional: She appears well-developed and well-nourished. No distress.  Abdominal: Soft. Bowel sounds are normal. She exhibits no distension. There is no tenderness. There is no rebound and no guarding.          Assessment & Plan:   Acute UTI.  Which was Cipro for 3 days.  Was also given a prescription for Diflucan if she develops symptoms of a vaginal yeast infection  Followup oncology

## 2013-09-25 NOTE — Patient Instructions (Signed)
Drink as much fluid as you  can tolerate over the next few days  Take your antibiotic as prescribed until ALL of it is gone, but stop if you develop a rash, swelling, or any side effects of the medication.  Contact our office as soon as possible if  there are side effects of the medication.   

## 2013-09-25 NOTE — Progress Notes (Signed)
Pre-visit discussion using our clinic review tool. No additional management support is needed unless otherwise documented below in the visit note.  

## 2013-11-13 ENCOUNTER — Ambulatory Visit (HOSPITAL_BASED_OUTPATIENT_CLINIC_OR_DEPARTMENT_OTHER): Payer: Medicare Other

## 2013-11-13 VITALS — BP 113/56 | HR 57 | Temp 97.4°F | Resp 16

## 2013-11-13 DIAGNOSIS — Z452 Encounter for adjustment and management of vascular access device: Secondary | ICD-10-CM | POA: Diagnosis not present

## 2013-11-13 DIAGNOSIS — Z95828 Presence of other vascular implants and grafts: Secondary | ICD-10-CM

## 2013-11-13 DIAGNOSIS — C569 Malignant neoplasm of unspecified ovary: Secondary | ICD-10-CM | POA: Diagnosis not present

## 2013-11-13 MED ORDER — HEPARIN SOD (PORK) LOCK FLUSH 100 UNIT/ML IV SOLN
500.0000 [IU] | Freq: Once | INTRAVENOUS | Status: AC
  Administered 2013-11-13: 500 [IU] via INTRAVENOUS
  Filled 2013-11-13: qty 5

## 2013-11-13 MED ORDER — SODIUM CHLORIDE 0.9 % IJ SOLN
10.0000 mL | INTRAMUSCULAR | Status: DC | PRN
  Administered 2013-11-13: 10 mL via INTRAVENOUS
  Filled 2013-11-13: qty 10

## 2013-11-13 NOTE — Patient Instructions (Signed)

## 2013-12-18 DIAGNOSIS — M999 Biomechanical lesion, unspecified: Secondary | ICD-10-CM | POA: Diagnosis not present

## 2013-12-18 DIAGNOSIS — M9981 Other biomechanical lesions of cervical region: Secondary | ICD-10-CM | POA: Diagnosis not present

## 2013-12-18 DIAGNOSIS — IMO0001 Reserved for inherently not codable concepts without codable children: Secondary | ICD-10-CM | POA: Diagnosis not present

## 2013-12-25 DIAGNOSIS — M9981 Other biomechanical lesions of cervical region: Secondary | ICD-10-CM | POA: Diagnosis not present

## 2013-12-25 DIAGNOSIS — IMO0001 Reserved for inherently not codable concepts without codable children: Secondary | ICD-10-CM | POA: Diagnosis not present

## 2013-12-25 DIAGNOSIS — M999 Biomechanical lesion, unspecified: Secondary | ICD-10-CM | POA: Diagnosis not present

## 2013-12-27 ENCOUNTER — Other Ambulatory Visit: Payer: Self-pay | Admitting: Internal Medicine

## 2014-01-15 ENCOUNTER — Ambulatory Visit (HOSPITAL_BASED_OUTPATIENT_CLINIC_OR_DEPARTMENT_OTHER): Payer: Medicare Other

## 2014-01-15 VITALS — BP 135/65 | HR 67 | Temp 98.6°F

## 2014-01-15 DIAGNOSIS — Z452 Encounter for adjustment and management of vascular access device: Secondary | ICD-10-CM | POA: Diagnosis not present

## 2014-01-15 DIAGNOSIS — Z95828 Presence of other vascular implants and grafts: Secondary | ICD-10-CM

## 2014-01-15 DIAGNOSIS — Z8543 Personal history of malignant neoplasm of ovary: Secondary | ICD-10-CM

## 2014-01-15 MED ORDER — HEPARIN SOD (PORK) LOCK FLUSH 100 UNIT/ML IV SOLN
500.0000 [IU] | Freq: Once | INTRAVENOUS | Status: AC
  Administered 2014-01-15: 500 [IU] via INTRAVENOUS
  Filled 2014-01-15: qty 5

## 2014-01-15 MED ORDER — SODIUM CHLORIDE 0.9 % IJ SOLN
10.0000 mL | INTRAMUSCULAR | Status: DC | PRN
  Administered 2014-01-15: 10 mL via INTRAVENOUS
  Filled 2014-01-15: qty 10

## 2014-01-15 NOTE — Patient Instructions (Signed)

## 2014-01-16 DIAGNOSIS — IMO0001 Reserved for inherently not codable concepts without codable children: Secondary | ICD-10-CM | POA: Diagnosis not present

## 2014-01-16 DIAGNOSIS — M999 Biomechanical lesion, unspecified: Secondary | ICD-10-CM | POA: Diagnosis not present

## 2014-01-16 DIAGNOSIS — M9981 Other biomechanical lesions of cervical region: Secondary | ICD-10-CM | POA: Diagnosis not present

## 2014-01-25 DIAGNOSIS — M999 Biomechanical lesion, unspecified: Secondary | ICD-10-CM | POA: Diagnosis not present

## 2014-01-25 DIAGNOSIS — IMO0001 Reserved for inherently not codable concepts without codable children: Secondary | ICD-10-CM | POA: Diagnosis not present

## 2014-01-25 DIAGNOSIS — M9981 Other biomechanical lesions of cervical region: Secondary | ICD-10-CM | POA: Diagnosis not present

## 2014-02-11 DIAGNOSIS — S92309A Fracture of unspecified metatarsal bone(s), unspecified foot, initial encounter for closed fracture: Secondary | ICD-10-CM | POA: Diagnosis not present

## 2014-02-26 DIAGNOSIS — S92354D Nondisplaced fracture of fifth metatarsal bone, right foot, subsequent encounter for fracture with routine healing: Secondary | ICD-10-CM | POA: Diagnosis not present

## 2014-03-07 ENCOUNTER — Ambulatory Visit
Admission: RE | Admit: 2014-03-07 | Discharge: 2014-03-07 | Disposition: A | Discharge status: Home / Self Care | Payer: Medicare Other | Source: Ambulatory Visit | Attending: Oncology | Admitting: Oncology

## 2014-03-07 ENCOUNTER — Other Ambulatory Visit: Payer: Self-pay | Admitting: Oncology

## 2014-03-07 DIAGNOSIS — Z1501 Genetic susceptibility to malignant neoplasm of breast: Secondary | ICD-10-CM

## 2014-03-07 DIAGNOSIS — Z1509 Genetic susceptibility to other malignant neoplasm: Principal | ICD-10-CM

## 2014-03-07 DIAGNOSIS — Z853 Personal history of malignant neoplasm of breast: Secondary | ICD-10-CM

## 2014-03-07 DIAGNOSIS — Z1231 Encounter for screening mammogram for malignant neoplasm of breast: Secondary | ICD-10-CM

## 2014-03-18 ENCOUNTER — Other Ambulatory Visit: Payer: Self-pay | Admitting: Oncology

## 2014-03-21 ENCOUNTER — Encounter: Payer: Self-pay | Admitting: Oncology

## 2014-03-21 ENCOUNTER — Ambulatory Visit (HOSPITAL_BASED_OUTPATIENT_CLINIC_OR_DEPARTMENT_OTHER): Payer: Medicare Other

## 2014-03-21 ENCOUNTER — Telehealth: Payer: Self-pay | Admitting: Oncology

## 2014-03-21 ENCOUNTER — Ambulatory Visit (HOSPITAL_BASED_OUTPATIENT_CLINIC_OR_DEPARTMENT_OTHER): Payer: Medicare Other | Admitting: Oncology

## 2014-03-21 ENCOUNTER — Other Ambulatory Visit (HOSPITAL_BASED_OUTPATIENT_CLINIC_OR_DEPARTMENT_OTHER): Payer: Medicare Other

## 2014-03-21 VITALS — BP 101/51 | HR 63 | Temp 97.1°F | Resp 16 | Ht 67.0 in | Wt 156.1 lb

## 2014-03-21 VITALS — BP 101/51 | HR 63 | Temp 97.5°F | Resp 16

## 2014-03-21 DIAGNOSIS — Z1509 Genetic susceptibility to other malignant neoplasm: Secondary | ICD-10-CM

## 2014-03-21 DIAGNOSIS — Z8543 Personal history of malignant neoplasm of ovary: Secondary | ICD-10-CM | POA: Diagnosis not present

## 2014-03-21 DIAGNOSIS — Z1501 Genetic susceptibility to malignant neoplasm of breast: Secondary | ICD-10-CM

## 2014-03-21 DIAGNOSIS — Z853 Personal history of malignant neoplasm of breast: Secondary | ICD-10-CM

## 2014-03-21 DIAGNOSIS — Z95828 Presence of other vascular implants and grafts: Secondary | ICD-10-CM

## 2014-03-21 HISTORY — DX: Genetic susceptibility to malignant neoplasm of breast: Z15.09

## 2014-03-21 HISTORY — DX: Genetic susceptibility to malignant neoplasm of breast: Z15.01

## 2014-03-21 LAB — CBC WITH DIFFERENTIAL/PLATELET
BASO%: 0.4 % (ref 0.0–2.0)
Basophils Absolute: 0 10*3/uL (ref 0.0–0.1)
EOS ABS: 0.2 10*3/uL (ref 0.0–0.5)
EOS%: 4.2 % (ref 0.0–7.0)
HEMATOCRIT: 45.2 % (ref 34.8–46.6)
HGB: 15.2 g/dL (ref 11.6–15.9)
LYMPH%: 26.6 % (ref 14.0–49.7)
MCH: 31.9 pg (ref 25.1–34.0)
MCHC: 33.6 g/dL (ref 31.5–36.0)
MCV: 95 fL (ref 79.5–101.0)
MONO#: 0.5 10*3/uL (ref 0.1–0.9)
MONO%: 8.9 % (ref 0.0–14.0)
NEUT#: 3.3 10*3/uL (ref 1.5–6.5)
NEUT%: 59.9 % (ref 38.4–76.8)
PLATELETS: 175 10*3/uL (ref 145–400)
RBC: 4.76 10*6/uL (ref 3.70–5.45)
RDW: 12.6 % (ref 11.2–14.5)
WBC: 5.5 10*3/uL (ref 3.9–10.3)
lymph#: 1.5 10*3/uL (ref 0.9–3.3)

## 2014-03-21 LAB — COMPREHENSIVE METABOLIC PANEL (CC13)
ALK PHOS: 88 U/L (ref 40–150)
ALT: 29 U/L (ref 0–55)
AST: 15 U/L (ref 5–34)
Albumin: 3.6 g/dL (ref 3.5–5.0)
Anion Gap: 7 mEq/L (ref 3–11)
BUN: 15.8 mg/dL (ref 7.0–26.0)
CALCIUM: 9.5 mg/dL (ref 8.4–10.4)
CO2: 26 mEq/L (ref 22–29)
CREATININE: 0.8 mg/dL (ref 0.6–1.1)
Chloride: 108 mEq/L (ref 98–109)
Glucose: 88 mg/dl (ref 70–140)
Potassium: 4 mEq/L (ref 3.5–5.1)
Sodium: 141 mEq/L (ref 136–145)
Total Bilirubin: 0.43 mg/dL (ref 0.20–1.20)
Total Protein: 6.7 g/dL (ref 6.4–8.3)

## 2014-03-21 MED ORDER — SODIUM CHLORIDE 0.9 % IJ SOLN
10.0000 mL | INTRAMUSCULAR | Status: DC | PRN
  Filled 2014-03-21: qty 10

## 2014-03-21 MED ORDER — HEPARIN SOD (PORK) LOCK FLUSH 100 UNIT/ML IV SOLN
500.0000 [IU] | Freq: Once | INTRAVENOUS | Status: DC
  Filled 2014-03-21: qty 5

## 2014-03-21 MED ORDER — SODIUM CHLORIDE 0.9 % IJ SOLN
10.0000 mL | INTRAMUSCULAR | Status: DC | PRN
  Administered 2014-03-21: 10 mL via INTRAVENOUS
  Filled 2014-03-21: qty 10

## 2014-03-21 MED ORDER — HEPARIN SOD (PORK) LOCK FLUSH 100 UNIT/ML IV SOLN
500.0000 [IU] | Freq: Once | INTRAVENOUS | Status: AC
  Administered 2014-03-21: 500 [IU] via INTRAVENOUS
  Filled 2014-03-21: qty 5

## 2014-03-21 NOTE — Progress Notes (Signed)
OFFICE PROGRESS NOTE   03/21/2014   Physicians::(David Sillman, Timmothy Sours Murinson), D.ClarkePearson, C.Streck, P.Kwaitkowski, J.Hayes, W.Whitworth (derm), S.Bowie, R.Wainer   INTERVAL HISTORY:  Patient is seen, alone for visit, in scheduled follow up of remote left breast cancer and ovarian cancer which has not been known active since she was treated for recurrent disease in 2008. She is BRCA 1 positive. She had tomo mammography at [facility] 03-08-2014 with no mammographic findings of concern. She tells me now that she was unable to tolerate positioning for breast MRI due to neck discomfort when that was done last in 2008; she did have yearly PET scans from 2008 thru 2013. She no longer sees gyn oncology, but is followed yearly by Dr Gertie Fey and plans to schedule that appointment upcoming. She continues Estradiol from Dr Gertie Fey (see below), which I did not clarify or address further when I first met her 08-2013.  Patient has generally been well since she was here last, tho she fractured right 5th metatarsal in a fall in [place] earlier this month, improved with boot from Dr Noemi Chapel after she returned home. She has noticed no changes in breasts, no abdominal or pelvic discomfort, no change in bowels or bladder.   She has PAC, placed by Dr Margot Chimes 2008 and flushed at this office every 8 weeks. She prefers to keep this. She refuses flu vaccine, which we have discussed now - still refuses. She is BRCA 1 positive.  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 She was intolerant of tamoxifen. She has a subpectoral implant on right.  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 She had recurrence in March 2008, with surgery then by Dr Margot Chimes for 4 cm mass along colon and left pelvic node. (That pathology was ER PR negative.) Patient recalls that only symptom of recurrence was burning discomfort in LLQ. She had additional 6 cycles of taxol carboplatin from April - July 2008 and has had no known active disease since then. Per patient, ca 2729 marker was elevated with the recurrent ovarian cancer, with normal ca125.  Review of systems as above, also: Good appetite and energy. No other new or different pain. No problems with PAC. No SOB or cough. No bleeding. No hot flashes on estradiol. Remainder of 10 point Review of Systems negative.  Objective:  Vital signs in last 24 hours:  BP 101/51  Pulse 63  Temp(Src) 97.1 F (36.2 C) (Oral)  Resp 16  Ht _0  (1.702 m)  Wt 156 lb 1.6 oz (70.806 kg)  BMI 24.44 kg/m2 Weight is down 4 lbs Alert, oriented and appropriate. Ambulatory without difficulty, right foot in elastic wrap.  HEENT:PERRL, sclerae not icteric. Oral mucosa moist without lesions, posterior pharynx clear.  Neck supple. No JVD.  Lymphatics:no cervical,supraclavicular, axillary or inguinal adenopathy Resp: clear to auscultation bilaterally and normal percussion bilaterally Cardio: regular rate and rhythm. No gallop. GI: soft, nontender, not distended, no mass or organomegaly. Normally active bowel sounds. Surgical incisions not remarkable. Musculoskeletal/ Extremities: without pitting edema, cords, tenderness including no swelling LUE. Neuro: no peripheral neuropathy. Otherwise nonfocal  PSYCH appropriate mood and affect Skin without rash, ecchymosis, petechiae Breasts: Reconstructed left breast without findings of concern for  local recurrence. Right without dominant mass, skin or nipple findings. Axillae benign. Portacath-without erythema or tenderness  Lab Results:  Results for orders placed in visit on 03/21/14  CBC WITH DIFFERENTIAL       Result Value Ref Range   WBC 5.5  3.9 - 10.3 10e3/uL   NEUT# 3.3  1.5 - 6.5 10e3/uL   HGB 15.2  11.6 - 15.9 g/dL   HCT 45.2  34.8 - 46.6 %   Platelets 175  145 - 400 10e3/uL   MCV 95.0  79.5 - 101.0 fL   MCH 31.9  25.1 - 34.0 pg   MCHC 33.6  31.5 - 36.0 g/dL   RBC 4.76  3.70 - 5.45 10e6/uL   RDW 12.6  11.2 - 14.5 %   lymph# 1.5  0.9 - 3.3 10e3/uL   MONO# 0.5  0.1 - 0.9 10e3/uL   Eosinophils Absolute 0.2  0.0 - 0.5 10e3/uL   Basophils Absolute 0.0  0.0 - 0.1 10e3/uL   NEUT% 59.9  38.4 - 76.8 %   LYMPH% 26.6  14.0 - 49.7 %   MONO% 8.9  0.0 - 14.0 %   EOS% 4.2  0.0 - 7.0 %   BASO% 0.4  0.0 - 2.0 %   CMET entirely normal  CA125 available after visit by new lab method 7 / by old lab method 4.9, having been 5.8 by old lab method on 08-2013 (Note new lab method has been higher in all patients) CA 2729 available after visit 24, having been 22 in 08-2013 and 25 in 02-2013. We will let her know results of markers. Studies/Results:  DIGITAL SCREENING UNILATERAL RIGHT MAMMOGRAM WITH IMPLANTS, TOMO AND CAD    [facility]08-[facility]  The patient has implants. Standard and implant displaced views were performed.  COMPARISON: Previous exam(s)  ACR Breast Density Category b: There are scattered areas of fibroglandular density.  FINDINGS: There are no findings suspicious for malignancy. Images were processed with CAD.  IMPRESSION: No mammographic evidence of malignancy. A result letter of this screening mammogram will be mailed directly to the patient.  RECOMMENDATION: Screening mammogram in one year. (Code:[id number])  BI-RADS CATEGORY 1: Negative.  Medications: I have reviewed the patient's current medications. She receives estradiol from Dr Gertie Fey, not from this office.  DISCUSSION: We have discussed my concerns about continuing estradiol with her history of breast and ovarian cancers and known BRCA1 abnormality. Paitient tells me that she had been on supplemental  estrogen for hot flashes prior to breast cancer diagnosis, then was off of supplemental estrogen for 3 years after the breast cancer diagnosis, during which time hot flashes were severe. She was restarted on estrogen at that point due to the hot flashes, which I believe has been ~ 20 years now. I cannot tell that she has tried off of the estrogen in past several years. She tells me that she is not willing to stop the estrogen, however she may be more open to considering this with one of the physicians whom she has known for much longer than myself. My recommendation is that she DC the estrogen, as hopefully hot flashes will not be significant now, or possibly could be controlled with another medication or intervention.  We discussed flu vaccine as above (refused)  We discussed her intolerance of MRI, which otherwise would be recommended yearly given the BRCA 1 mutation. I have mentioned significant radiation exposure from multiple PET scans, tho obviously risk to benefit ratio was felt appropriate with  the scans previously due to her history with the ovarian cancer recurrence. I have suggested that since there are no obvious concerns now, that we see if any problems on Dr Diamantina Monks exam rather than scheduling PET now. Certainly if concerns we can image, otherwise we will discuss again at my next visit in 6 months.   Assessment/Plan: 1. Ovarian carcinoma: initially diagnosed 68 and recurrent 2008, treatment as above, clinically doing well. I will see her in 6 months, or sooner if needed; she has yearly exams by gyn also. I would have low threshold for imaging with CT or possibly PET if concerns, but may not need this on routine basis now.  2.intraductal carcinoma left breast 1992, post mastectomy and 13 node axillary dissection.  MRI would be appropriate additionally due to BRCA status, however she cannot tolerate positioning with neck in MRI. See supplemental estrogen concerns above. 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 Stable and not bothersome. 9. At least 20 years on supplemental estrogen: discussion as above.    All questions answered. She knows that she can call prior to next scheduled visit if needed. CC this note to Drs Gertie Fey and Nonie Hoyer Time spent 25 min including >50% counseling and coordination of care.   [name] P, MD   03/21/2014, 2:39 PM

## 2014-03-21 NOTE — Patient Instructions (Signed)

## 2014-03-21 NOTE — Telephone Encounter (Signed)
, °

## 2014-03-22 LAB — CA 125: CA 125: 7 U/mL (ref <35)

## 2014-03-22 LAB — CA 125(PREVIOUS METHOD): CA 125: 4.9 U/mL (ref 0.0–30.2)

## 2014-03-22 LAB — CANCER ANTIGEN 27.29: CA 27.29: 24 U/mL (ref 0–39)

## 2014-03-23 DIAGNOSIS — L814 Other melanin hyperpigmentation: Secondary | ICD-10-CM | POA: Diagnosis not present

## 2014-03-23 DIAGNOSIS — L821 Other seborrheic keratosis: Secondary | ICD-10-CM | POA: Diagnosis not present

## 2014-03-23 DIAGNOSIS — L570 Actinic keratosis: Secondary | ICD-10-CM | POA: Diagnosis not present

## 2014-03-26 ENCOUNTER — Telehealth: Payer: Self-pay

## 2014-03-26 NOTE — Telephone Encounter (Signed)
Told [name] the results of the Tumor markers as noted below by Dr. Marko Plume.

## 2014-03-26 NOTE — Telephone Encounter (Signed)
-----   Message from Gordy Levan, MD sent at 03/25/2014  5:26 PM EST ----- Labs seen and need follow up: please let her know both CA125 and CA 2729 are in good low range still    thanks

## 2014-04-13 DIAGNOSIS — L570 Actinic keratosis: Secondary | ICD-10-CM | POA: Diagnosis not present

## 2014-04-30 ENCOUNTER — Telehealth: Payer: Self-pay | Admitting: Oncology

## 2014-04-30 NOTE — Telephone Encounter (Signed)
s.w. pt and advised on d.t. change for April appt.Marland KitchenMarland KitchenMarland KitchenMarland Kitchenpt ok and aware.

## 2014-05-14 ENCOUNTER — Ambulatory Visit (HOSPITAL_BASED_OUTPATIENT_CLINIC_OR_DEPARTMENT_OTHER): Payer: Medicare Other

## 2014-05-14 VITALS — BP 110/51 | HR 68 | Temp 97.8°F

## 2014-05-14 DIAGNOSIS — Z452 Encounter for adjustment and management of vascular access device: Secondary | ICD-10-CM | POA: Diagnosis not present

## 2014-05-14 DIAGNOSIS — Z8543 Personal history of malignant neoplasm of ovary: Secondary | ICD-10-CM | POA: Diagnosis not present

## 2014-05-14 DIAGNOSIS — Z95828 Presence of other vascular implants and grafts: Secondary | ICD-10-CM

## 2014-05-14 MED ORDER — HEPARIN SOD (PORK) LOCK FLUSH 100 UNIT/ML IV SOLN
500.0000 [IU] | Freq: Once | INTRAVENOUS | Status: AC
  Administered 2014-05-14: 500 [IU] via INTRAVENOUS
  Filled 2014-05-14: qty 5

## 2014-05-14 MED ORDER — SODIUM CHLORIDE 0.9 % IJ SOLN
10.0000 mL | INTRAMUSCULAR | Status: DC | PRN
  Administered 2014-05-14: 10 mL via INTRAVENOUS
  Filled 2014-05-14: qty 10

## 2014-05-14 NOTE — Patient Instructions (Signed)

## 2014-06-20 ENCOUNTER — Encounter: Payer: Self-pay | Admitting: Internal Medicine

## 2014-06-20 ENCOUNTER — Telehealth: Payer: Self-pay | Admitting: Internal Medicine

## 2014-06-20 ENCOUNTER — Ambulatory Visit (INDEPENDENT_AMBULATORY_CARE_PROVIDER_SITE_OTHER): Payer: Medicare Other | Admitting: Internal Medicine

## 2014-06-20 VITALS — BP 130/68 | HR 75 | Temp 97.9°F | Resp 20 | Ht 67.0 in | Wt 155.0 lb

## 2014-06-20 DIAGNOSIS — Z1509 Genetic susceptibility to other malignant neoplasm: Secondary | ICD-10-CM

## 2014-06-20 DIAGNOSIS — E039 Hypothyroidism, unspecified: Secondary | ICD-10-CM

## 2014-06-20 DIAGNOSIS — Z1501 Genetic susceptibility to malignant neoplasm of breast: Secondary | ICD-10-CM

## 2014-06-20 DIAGNOSIS — Z8543 Personal history of malignant neoplasm of ovary: Secondary | ICD-10-CM | POA: Diagnosis not present

## 2014-06-20 DIAGNOSIS — N3000 Acute cystitis without hematuria: Secondary | ICD-10-CM | POA: Diagnosis not present

## 2014-06-20 DIAGNOSIS — R300 Dysuria: Secondary | ICD-10-CM

## 2014-06-20 DIAGNOSIS — Z853 Personal history of malignant neoplasm of breast: Secondary | ICD-10-CM

## 2014-06-20 LAB — POCT URINALYSIS DIPSTICK
Bilirubin, UA: NEGATIVE
GLUCOSE UA: NEGATIVE
Nitrite, UA: NEGATIVE
Spec Grav, UA: >=1.03
Urobilinogen, UA: 0.2
pH, UA: 5.5

## 2014-06-20 LAB — TSH: TSH: 1.51 u[IU]/mL (ref 0.35–4.50)

## 2014-06-20 MED ORDER — CIPROFLOXACIN HCL 500 MG PO TABS: 500.0000 mg | ORAL_TABLET | Freq: Two times a day (BID) | ORAL | Status: DC

## 2014-06-20 MED ORDER — ESTRADIOL 1 MG PO TABS: 1.0000 mg | ORAL_TABLET | Freq: Every day | ORAL | Status: DC

## 2014-06-20 MED ORDER — FLUCONAZOLE 150 MG PO TABS: 150.0000 mg | ORAL_TABLET | Freq: Once | ORAL | Status: DC

## 2014-06-20 MED ORDER — LEVOTHYROXINE SODIUM 88 MCG PO TABS: 88.0000 ug | ORAL_TABLET | Freq: Every day | ORAL | Status: DC

## 2014-06-20 NOTE — Patient Instructions (Signed)
It is important that you exercise regularly, at least 20 minutes 3 to 4 times per week.  If you develop chest pain or shortness of breath seek  medical attention.  Take your antibiotic as prescribed until ALL of it is gone, but stop if you develop a rash, swelling, or any side effects of the medication.  Contact our office as soon as possible if  there are side effects of the medication.

## 2014-06-20 NOTE — Progress Notes (Signed)
Subjective:    Patient ID: [name], female    DOB: 06/24/1940, 74 y.o.   MRN: [id number]  HPI 74 year old patient who presents with a chief complaint of burning dysuria and frequency of 2-3 days duration.  No fever, chills, flank pain or constitutional complaints.  Urinalysis reviewed and confirmed pyuria  Patient is followed closely by oncology.  Due to a prior history of left breast and ovarian cancer.  These are estrogen receptor negative and have been in remission.  She has been on hormone replacement therapy for greater than 20 years due to significant vasomotor symptoms.  The risks and benefits of this therapy again discussed at length and was also discussed by oncology.  She states that she gave herself a trial off hormones as recently as last month with significant breakthrough symptoms.  She was made aware of the increased thrombotic risk  She has hypothyroidism but no recent TSH.  Will check today  Past Medical History  Diagnosis Date  . Ovarian cancer     March 1998  . DCIS (ductal carcinoma in situ) of breast   . Unspecified vitamin D deficiency     History   Social History  . Marital Status: Divorced    Spouse Name: N/A    Number of Children: N/A  . Years of Education: N/A   Occupational History  . Not on file.   Social History Main Topics  . Smoking status: Former Smoker    Quit date: 05/26/1991  . Smokeless tobacco: Never Used  . Alcohol Use: Yes  . Drug Use: No  . Sexual Activity: Not on file   Other Topics Concern  . Not on file   Social History Narrative   March 1998- history of stage III ovarian cancer; status post suboptimal debulking; status post 6 cycles of Taxoil and carboplatin with complete remission documented by second look surgery September 1998.      Recurrent ovarian cancer, status post 6 cycles carbo and Taxol, April 1998 until July 2008      1992, left breast intraductal carcinoma (DCIS)   Status post left modified radial  mastectomy and with silicon implant   13 of 13 lymph modes negative; positive for BCRA1 gene   Recurrent ovarian carinoma, February 2008   Admit Feb. 2009 partial small bowel obstruction treated medically    Past Surgical History  Procedure Laterality Date  . Biopsy thyroid      for benign disease  . Colonscopy  2004    2/08  . Left ankle fracture      status post surgery, March 2010  . Cholecystectomy      Family History  Problem Relation Age of Onset  . Cancer Mother     [name], historyof melanoma   . Aneurysm Father     abdominal aortic  . Heart disease Father   . Diverticulitis Brother     Allergies  Allergen Reactions  . Codeine Sulfate     REACTION: unspecified  . Promethazine Hcl     REACTION: unspecified    Current Outpatient Prescriptions on File Prior to Visit  Medication Sig Dispense Refill  . b complex vitamins tablet Take 1 tablet by mouth daily.      Marland Kitchen BIOTIN PO Take by mouth daily.    . Calcium Carbonate-Vitamin D (CALTRATE 600+D) 600-400 MG-UNIT per chew tablet Chew 1 tablet by mouth daily.      . Cholecalciferol (VITAMIN D) 1000 UNITS capsule Take 1,000 Units by mouth  daily.      Marland Kitchen CINNAMON PO Take by mouth daily.    . Coenzyme Q10 (CO Q 10) 10 MG CAPS Take by mouth daily.      Marland Kitchen ECHINACEA PO Take 1 tablet by mouth every other day.    . fish oil-omega-3 fatty acids 1000 MG capsule Take 2 g by mouth daily.      . Glucosamine 500 MG TABS Take 1,500 mg by mouth daily.    Marland Kitchen MAGNESIUM OXIDE PO Take by mouth every other day.    . Melatonin 1 MG CAPS Take by mouth at bedtime as needed.    . multivitamin (THERAGRAN) per tablet Take 1 tablet by mouth daily.      . vitamin C (ASCORBIC ACID) 500 MG tablet Take 500 mg by mouth daily.     No current facility-administered medications on file prior to visit.    BP 130/68 mmHg  Pulse 75  Temp(Src) 97.9 F (36.6 C) (Oral)  Resp 20  Ht 5\' 7"  (1.702 m)  Wt 155 lb (70.308 kg)  BMI 24.27 kg/m2  SpO2  96%      Review of Systems  Constitutional: Negative.  Negative for fever.  HENT: Negative for congestion, dental problem, hearing loss, rhinorrhea, sinus pressure, sore throat and tinnitus.   Eyes: Negative for pain, discharge and visual disturbance.  Respiratory: Negative for cough and shortness of breath.   Cardiovascular: Negative for chest pain, palpitations and leg swelling.  Gastrointestinal: Negative for nausea, vomiting, abdominal pain, diarrhea, constipation, blood in stool and abdominal distention.  Genitourinary: Positive for dysuria and frequency. Negative for urgency, hematuria, flank pain, vaginal bleeding, vaginal discharge, difficulty urinating, vaginal pain and pelvic pain.  Musculoskeletal: Negative for joint swelling, arthralgias and gait problem.  Skin: Negative for rash.  Neurological: Negative for dizziness, syncope, speech difficulty, weakness, numbness and headaches.  Hematological: Negative for adenopathy.  Psychiatric/Behavioral: Negative for behavioral problems, dysphoric mood and agitation. The patient is not nervous/anxious.        Objective:   Physical Exam  Constitutional: She is oriented to person, place, and time. She appears well-developed and well-nourished.  Repeat blood pressure right arm 122 over 68  HENT:  Head: Normocephalic.  Right Ear: External ear normal.  Left Ear: External ear normal.  Mouth/Throat: Oropharynx is clear and moist.  Eyes: Conjunctivae and EOM are normal. Pupils are equal, round, and reactive to light.  Neck: Normal range of motion. Neck supple. No thyromegaly present.  Cardiovascular: Normal rate, regular rhythm, normal heart sounds and intact distal pulses.   Pulmonary/Chest: Effort normal and breath sounds normal.  Abdominal: Soft. Bowel sounds are normal. She exhibits no mass. There is no tenderness.  No suprapubic or flank tenderness  Musculoskeletal: Normal range of motion.  Lymphadenopathy:    She has no  cervical adenopathy.  Neurological: She is alert and oriented to person, place, and time.  Skin: Skin is warm and dry. No rash noted.  Psychiatric: She has a normal mood and affect. Her behavior is normal.          Assessment & Plan:   Acute cystitis.  Will treat with Cipro for 3 days.  A prescription for Diflucan.  Also dispensed.  If needed History of left breast and ovarian cancer.  Follow-up oncology.  Her gynecologist has retired, but will establish with a new physician soon

## 2014-06-20 NOTE — Telephone Encounter (Signed)
Rx sent to pharmacy   

## 2014-06-20 NOTE — Progress Notes (Signed)
Pre visit review using our clinic review tool, if applicable. No additional management support is needed unless otherwise documented below in the visit note. 

## 2014-06-20 NOTE — Telephone Encounter (Signed)
Pt needs refill on estradiol 1 mg # 90 w/refills [facility]

## 2014-07-16 ENCOUNTER — Ambulatory Visit (HOSPITAL_BASED_OUTPATIENT_CLINIC_OR_DEPARTMENT_OTHER): Payer: Medicare Other

## 2014-07-16 DIAGNOSIS — Z8543 Personal history of malignant neoplasm of ovary: Secondary | ICD-10-CM | POA: Diagnosis not present

## 2014-07-16 DIAGNOSIS — Z452 Encounter for adjustment and management of vascular access device: Secondary | ICD-10-CM

## 2014-07-16 DIAGNOSIS — Z95828 Presence of other vascular implants and grafts: Secondary | ICD-10-CM

## 2014-07-16 MED ORDER — HEPARIN SOD (PORK) LOCK FLUSH 100 UNIT/ML IV SOLN
500.0000 [IU] | Freq: Once | INTRAVENOUS | Status: AC
  Administered 2014-07-16: 500 [IU] via INTRAVENOUS
  Filled 2014-07-16: qty 5

## 2014-07-16 MED ORDER — SODIUM CHLORIDE 0.9 % IJ SOLN
10.0000 mL | INTRAMUSCULAR | Status: DC | PRN
  Administered 2014-07-16: 10 mL via INTRAVENOUS
  Filled 2014-07-16: qty 10

## 2014-07-16 NOTE — Patient Instructions (Signed)

## 2014-09-03 ENCOUNTER — Other Ambulatory Visit: Payer: Self-pay | Admitting: Oncology

## 2014-09-03 ENCOUNTER — Telehealth: Payer: Self-pay | Admitting: Oncology

## 2014-09-03 NOTE — Telephone Encounter (Signed)
per pof to move pt to 4/22 due to spot needed for chemo pt-cld & spoke to pt and adv of updated appt time & sch

## 2014-09-05 DIAGNOSIS — M791 Myalgia: Secondary | ICD-10-CM | POA: Diagnosis not present

## 2014-09-05 DIAGNOSIS — M9901 Segmental and somatic dysfunction of cervical region: Secondary | ICD-10-CM | POA: Diagnosis not present

## 2014-09-10 ENCOUNTER — Other Ambulatory Visit: Payer: Self-pay | Admitting: Oncology

## 2014-09-12 ENCOUNTER — Other Ambulatory Visit: Payer: Self-pay | Admitting: Oncology

## 2014-09-12 DIAGNOSIS — Z8543 Personal history of malignant neoplasm of ovary: Secondary | ICD-10-CM

## 2014-09-12 DIAGNOSIS — D0592 Unspecified type of carcinoma in situ of left breast: Secondary | ICD-10-CM

## 2014-09-14 ENCOUNTER — Telehealth: Payer: Self-pay | Admitting: Oncology

## 2014-09-14 ENCOUNTER — Ambulatory Visit (HOSPITAL_BASED_OUTPATIENT_CLINIC_OR_DEPARTMENT_OTHER): Payer: Medicare Other | Admitting: Oncology

## 2014-09-14 ENCOUNTER — Other Ambulatory Visit (HOSPITAL_BASED_OUTPATIENT_CLINIC_OR_DEPARTMENT_OTHER): Payer: Medicare Other

## 2014-09-14 ENCOUNTER — Encounter: Payer: Self-pay | Admitting: Oncology

## 2014-09-14 ENCOUNTER — Ambulatory Visit (HOSPITAL_BASED_OUTPATIENT_CLINIC_OR_DEPARTMENT_OTHER): Payer: Medicare Other

## 2014-09-14 VITALS — BP 131/51 | HR 71 | Temp 97.6°F | Resp 18 | Ht 67.0 in | Wt 158.1 lb

## 2014-09-14 DIAGNOSIS — Z1501 Genetic susceptibility to malignant neoplasm of breast: Secondary | ICD-10-CM | POA: Diagnosis not present

## 2014-09-14 DIAGNOSIS — Z1509 Genetic susceptibility to other malignant neoplasm: Principal | ICD-10-CM

## 2014-09-14 DIAGNOSIS — Z853 Personal history of malignant neoplasm of breast: Secondary | ICD-10-CM

## 2014-09-14 DIAGNOSIS — Z1231 Encounter for screening mammogram for malignant neoplasm of breast: Secondary | ICD-10-CM

## 2014-09-14 DIAGNOSIS — Z8543 Personal history of malignant neoplasm of ovary: Secondary | ICD-10-CM

## 2014-09-14 DIAGNOSIS — Z95828 Presence of other vascular implants and grafts: Secondary | ICD-10-CM

## 2014-09-14 DIAGNOSIS — C569 Malignant neoplasm of unspecified ovary: Secondary | ICD-10-CM

## 2014-09-14 DIAGNOSIS — R55 Syncope and collapse: Secondary | ICD-10-CM

## 2014-09-14 LAB — CBC WITH DIFFERENTIAL/PLATELET
BASO%: 0.9 % (ref 0.0–2.0)
BASOS ABS: 0.1 10*3/uL (ref 0.0–0.1)
EOS ABS: 0.2 10*3/uL (ref 0.0–0.5)
EOS%: 3 % (ref 0.0–7.0)
HCT: 45.3 % (ref 34.8–46.6)
HEMOGLOBIN: 14.9 g/dL (ref 11.6–15.9)
LYMPH%: 21.1 % (ref 14.0–49.7)
MCH: 31 pg (ref 25.1–34.0)
MCHC: 33 g/dL (ref 31.5–36.0)
MCV: 94.1 fL (ref 79.5–101.0)
MONO#: 0.6 10*3/uL (ref 0.1–0.9)
MONO%: 10 % (ref 0.0–14.0)
NEUT#: 4.2 10*3/uL (ref 1.5–6.5)
NEUT%: 65 % (ref 38.4–76.8)
PLATELETS: 204 10*3/uL (ref 145–400)
RBC: 4.81 10*6/uL (ref 3.70–5.45)
RDW: 12.6 % (ref 11.2–14.5)
WBC: 6.4 10*3/uL (ref 3.9–10.3)
lymph#: 1.3 10*3/uL (ref 0.9–3.3)

## 2014-09-14 LAB — COMPREHENSIVE METABOLIC PANEL (CC13)
ALK PHOS: 81 U/L (ref 40–150)
ALT: 18 U/L (ref 0–55)
ANION GAP: 11 meq/L (ref 3–11)
AST: 16 U/L (ref 5–34)
Albumin: 3.4 g/dL — ABNORMAL LOW (ref 3.5–5.0)
BUN: 18.9 mg/dL (ref 7.0–26.0)
CO2: 22 meq/L (ref 22–29)
Calcium: 8.6 mg/dL (ref 8.4–10.4)
Chloride: 108 mEq/L (ref 98–109)
Creatinine: 0.7 mg/dL (ref 0.6–1.1)
EGFR: 79 mL/min/{1.73_m2} — AB (ref >90)
GLUCOSE: 92 mg/dL (ref 70–140)
POTASSIUM: 3.9 meq/L (ref 3.5–5.1)
SODIUM: 141 meq/L (ref 136–145)
TOTAL PROTEIN: 6.6 g/dL (ref 6.4–8.3)
Total Bilirubin: 0.34 mg/dL (ref 0.20–1.20)

## 2014-09-14 MED ORDER — HEPARIN SOD (PORK) LOCK FLUSH 100 UNIT/ML IV SOLN
500.0000 [IU] | Freq: Once | INTRAVENOUS | Status: AC
  Administered 2014-09-14: 500 [IU] via INTRAVENOUS
  Filled 2014-09-14: qty 5

## 2014-09-14 MED ORDER — SODIUM CHLORIDE 0.9 % IJ SOLN
10.0000 mL | INTRAMUSCULAR | Status: DC | PRN
  Administered 2014-09-14: 10 mL via INTRAVENOUS
  Filled 2014-09-14: qty 10

## 2014-09-14 NOTE — Patient Instructions (Signed)

## 2014-09-14 NOTE — Progress Notes (Signed)
OFFICE PROGRESS NOTE   September 14, 2014   Physicians:(David Bridgett Larsson Murinson), D.ClarkePearson, C.Streck, P.Kwaitkowski, J.Hayes, W.Whitworth (derm), (S.Bowie), R.Wainer Expects to establish with Dr Vanessa Kick for gyn follow up.  INTERVAL HISTORY:  Patient is seen, alone for visit, in 6 month follow up of history of DCIS left breast and ovarian carcinoma in setting of BRCA 1 abnormality. She remains on observation for the oncology problems since she was last treated for recurrence of the ovarian cancer in 2008. Note she continues estradiol despite this history, used x years for hot flashes from gyn; she decreased Estradiol to every other day since discussing also with Dr Nonie Hoyer in 05-2014.  Last right mammogram (tomo) was at [facility] 03-08-14, scattered areas of fibroglandular density and subpectoral implant, no mammographic findings of concern. She has not had breast MRIs despite the BRCA information, as she cannot tolerate positioning in MRI scanner for these. She no longer sees gyn oncology, did follow regularly with Dr Gertie Fey until his recent retirement. She is due back to that office upcoming, hopes to schedule to Dr Vanessa Kick.  She has had PAC in place since 2008, which she still prefers to keep "the last time it was taken out the cancer came back in 2 years". The PAC is flushed at this office every 8 weeks. I have mentioned that there is always some risk of infection with foreign bodies, tho fortunately she has not had complications.    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 She was intolerant of tamoxifen. She has a subpectoral implant on right.  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 She had recurrence in March 2008, with surgery then by Dr Margot Chimes for 4 cm mass along colon and left pelvic node. (That pathology was ER PR negative.) Patient recalls that only symptom of recurrence was burning discomfort in LLQ. She had additional 6 cycles of taxol carboplatin from April - July 2008 and has had no known active disease since then. Per patient, ca 2729 marker was elevated with the recurrent ovarian cancer, with normal ca125; that information not available in this EMR.    Review of systems as above, also: Good energy, good appetite. No pain. No respiratory, GI, musculoskeletal, hematologic complaints. Hot flashes tolerable with every other day estradiol. Remainder of 10 point Review of Systems negative.  Objective:  Vital signs in last 24 hours:  BP 131/51 mmHg  Pulse 71  Temp(Src) 97.6 F (36.4 C) (Oral)  Resp 18  Ht '5\' 7"'  (1.702 m)  Wt 158 lb 1.6 oz (71.714 kg)  BMI 24.76 kg/m2  SpO2 99%  weight up 2 lbs.  Alert, oriented and appropriate. Ambulatory without difficulty.    HEENT:PERRL, sclerae not icteric. Oral mucosa moist without lesions, posterior pharynx clear.  Neck supple. No JVD.  Lymphatics:no cervical,supraclavicular, axillary or inguinal adenopathy Resp: clear to auscultation bilaterally and normal percussion bilaterally Cardio: regular rate and rhythm. No gallop. GI: soft, nontender, not distended, no mass or organomegaly. Normally active bowel sounds. Surgical incision not remarkable. Musculoskeletal/ Extremities: without pitting edema, cords, tenderness. No obvious increase in mild lymphedema RUE  Neuro: nonfocal.  Skin without rash, ecchymosis, petechiae Breasts: left reconstructed breast without evidence of local recurence. Right breast without dominant mass, skin or nipple findings, subpectoral implant. Axillae  benign. Portacath-without erythema or tenderness  Lab Results:  Results for orders placed or performed in visit on 09/14/14  CBC with Differential  Result Value Ref Range   WBC 6.4 3.9 - 10.3 10e3/uL   NEUT# 4.2 1.5 - 6.5 10e3/uL   HGB 14.9 11.6 - 15.9 g/dL   HCT 45.3 34.8 - 46.6 %   Platelets 204 145 - 400 10e3/uL   MCV 94.1 79.5 - 101.0 fL   MCH 31.0 25.1 - 34.0 pg   MCHC 33.0 31.5 - 36.0 g/dL   RBC 4.81 3.70 - 5.45 10e6/uL   RDW 12.6 11.2 - 14.5 %   lymph# 1.3 0.9 - 3.3 10e3/uL   MONO# 0.6 0.1 - 0.9 10e3/uL   Eosinophils Absolute 0.2 0.0 - 0.5 10e3/uL   Basophils Absolute 0.1 0.0 - 0.1 10e3/uL   NEUT% 65.0 38.4 - 76.8 %   LYMPH% 21.1 14.0 - 49.7 %   MONO% 10.0 0.0 - 14.0 %   EOS% 3.0 0.0 - 7.0 %   BASO% 0.9 0.0 - 2.0 %  Comprehensive metabolic panel (Cmet) - [facility]  Result Value Ref Range   Sodium 141 136 - 145 mEq/L   Potassium 3.9 3.5 - 5.1 mEq/L   Chloride 108 98 - 109 mEq/L   CO2 22 22 - 29 mEq/L   Glucose 92 70 - 140 mg/dl   BUN 18.9 7.0 - 26.0 mg/dL   Creatinine 0.7 0.6 - 1.1 mg/dL   Total Bilirubin 0.34 0.20 - 1.20 mg/dL   Alkaline Phosphatase 81 40 - 150 U/L   AST 16 5 - 34 U/L   ALT 18 0 - 55 U/L   Total Protein 6.6 6.4 - 8.3 g/dL   Albumin 3.4 (L) 3.5 - 5.0 g/dL   Calcium 8.6 8.4 - 10.4 mg/dL   Anion Gap 11 3 - 11 mEq/L   EGFR 79 (L) >90 ml/min/1.73 m2    CA 125 available after visit 7  Studies/Results:  No results found.  [facility] as above.   Last PET 02-2012.  Medications: I have reviewed the patient's current medications. I have encouraged her to stop estradiol if possible, due to risk of hormonal replacement with BRCA abnormality and extended use.  DISCUSSION: Medications as above. I have mentioned concern about diagnostic radiation exposure with BRCA mutation. As she is clinically doing well, I would not recommend routine surveillance PET scans. Not discussed now, CTs without PET frequently sufficient if  clinical concerns, and less radiation exposure.  Patient seems to understand our discussion and agrees with 6 month follow up here. She plans to schedule gyn appointment herself.  Assessment/Plan:  1. Ovarian carcinoma: initially diagnosed 3 and recurrent 2008, treatment as above, clinically doing well. I will see her in Oct after mammograms, or sooner if needed; she has yearly exams by gyn and follows with PCP also.  2.intraductal carcinoma left breast 1992, post mastectomy and 13 node axillary dissection. 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 Stable and not bothersome. 9. At least  20 years on supplemental estrogen: discussion as above. She has decreased this to every other day.  All questions answered. Patient knows that she can call prior to next scheduled visit if needed. Cc this note to Drs Nonie Hoyer and Vanessa Kick. Time spent 25 min including >50% counseling and coordination of care.     Gordy Levan, MD   09/14/2014, 3:24 PM

## 2014-09-14 NOTE — Telephone Encounter (Signed)
Appointments made and avs printed for patient °

## 2014-09-15 LAB — CA 125: CA 125: 7 U/mL (ref <35)

## 2014-09-16 DIAGNOSIS — Z0000 Encounter for general adult medical examination without abnormal findings: Secondary | ICD-10-CM | POA: Insufficient documentation

## 2014-09-16 DIAGNOSIS — R55 Syncope and collapse: Secondary | ICD-10-CM

## 2014-09-16 DIAGNOSIS — Z95828 Presence of other vascular implants and grafts: Secondary | ICD-10-CM | POA: Insufficient documentation

## 2014-09-16 DIAGNOSIS — Z8543 Personal history of malignant neoplasm of ovary: Secondary | ICD-10-CM | POA: Insufficient documentation

## 2014-09-16 DIAGNOSIS — Z1231 Encounter for screening mammogram for malignant neoplasm of breast: Secondary | ICD-10-CM | POA: Insufficient documentation

## 2014-09-16 HISTORY — DX: Syncope and collapse: R55

## 2014-09-19 ENCOUNTER — Other Ambulatory Visit: Payer: Medicare Other

## 2014-09-19 ENCOUNTER — Ambulatory Visit: Payer: Medicare Other | Admitting: Oncology

## 2014-09-20 ENCOUNTER — Ambulatory Visit: Payer: Medicare Other | Admitting: Oncology

## 2014-09-20 ENCOUNTER — Other Ambulatory Visit: Payer: Medicare Other

## 2014-10-03 ENCOUNTER — Other Ambulatory Visit: Payer: Self-pay | Admitting: Internal Medicine

## 2014-10-03 MED ORDER — ESTRADIOL 1 MG PO TABS: 1.0000 mg | ORAL_TABLET | ORAL | Status: DC

## 2014-10-18 ENCOUNTER — Other Ambulatory Visit: Payer: Self-pay | Admitting: *Deleted

## 2014-10-18 MED ORDER — ESTRADIOL 1 MG PO TABS: 1.0000 mg | ORAL_TABLET | ORAL | Status: DC

## 2014-11-08 ENCOUNTER — Ambulatory Visit (HOSPITAL_BASED_OUTPATIENT_CLINIC_OR_DEPARTMENT_OTHER): Payer: Medicare Other

## 2014-11-08 VITALS — BP 120/54 | HR 67 | Temp 98.5°F

## 2014-11-08 DIAGNOSIS — Z8543 Personal history of malignant neoplasm of ovary: Secondary | ICD-10-CM | POA: Diagnosis not present

## 2014-11-08 DIAGNOSIS — Z95828 Presence of other vascular implants and grafts: Secondary | ICD-10-CM

## 2014-11-08 DIAGNOSIS — Z452 Encounter for adjustment and management of vascular access device: Secondary | ICD-10-CM | POA: Diagnosis not present

## 2014-11-08 MED ORDER — SODIUM CHLORIDE 0.9 % IJ SOLN
10.0000 mL | INTRAMUSCULAR | Status: DC | PRN
  Administered 2014-11-08: 10 mL via INTRAVENOUS
  Filled 2014-11-08: qty 10

## 2014-11-08 MED ORDER — HEPARIN SOD (PORK) LOCK FLUSH 100 UNIT/ML IV SOLN
500.0000 [IU] | Freq: Once | INTRAVENOUS | Status: AC
  Administered 2014-11-08: 500 [IU] via INTRAVENOUS
  Filled 2014-11-08: qty 5

## 2014-11-08 NOTE — Patient Instructions (Signed)

## 2014-11-19 ENCOUNTER — Other Ambulatory Visit: Payer: Self-pay

## 2014-11-23 DIAGNOSIS — D692 Other nonthrombocytopenic purpura: Secondary | ICD-10-CM | POA: Diagnosis not present

## 2014-11-23 DIAGNOSIS — D225 Melanocytic nevi of trunk: Secondary | ICD-10-CM | POA: Diagnosis not present

## 2014-11-23 DIAGNOSIS — L821 Other seborrheic keratosis: Secondary | ICD-10-CM | POA: Diagnosis not present

## 2014-11-23 DIAGNOSIS — L814 Other melanin hyperpigmentation: Secondary | ICD-10-CM | POA: Diagnosis not present

## 2014-12-07 ENCOUNTER — Encounter: Payer: Self-pay | Admitting: Internal Medicine

## 2014-12-07 ENCOUNTER — Other Ambulatory Visit: Payer: Self-pay | Admitting: Internal Medicine

## 2014-12-07 ENCOUNTER — Telehealth: Payer: Self-pay | Admitting: Internal Medicine

## 2014-12-07 MED ORDER — ESTRADIOL 1 MG PO TABS: ORAL_TABLET | ORAL | Status: DC

## 2014-12-07 NOTE — Telephone Encounter (Signed)
Spoke to pt, asked her how can I help her? Pt stated I want to speak to Dr.K.  Told her okay will give Dr.K the message to call her. Pt verbalized understanding.

## 2014-12-07 NOTE — Telephone Encounter (Signed)
Dr.K, please call pt she wants to speak to you only.

## 2014-12-07 NOTE — Telephone Encounter (Signed)
Pt requesting a call back in related to questions she has about her estrogen prescription.

## 2014-12-25 ENCOUNTER — Ambulatory Visit (HOSPITAL_BASED_OUTPATIENT_CLINIC_OR_DEPARTMENT_OTHER): Payer: Medicare Other

## 2014-12-25 VITALS — BP 120/50 | HR 57 | Temp 98.0°F | Resp 16

## 2014-12-25 DIAGNOSIS — Z95828 Presence of other vascular implants and grafts: Secondary | ICD-10-CM

## 2014-12-25 DIAGNOSIS — Z8543 Personal history of malignant neoplasm of ovary: Secondary | ICD-10-CM

## 2014-12-25 DIAGNOSIS — Z452 Encounter for adjustment and management of vascular access device: Secondary | ICD-10-CM

## 2014-12-25 MED ORDER — HEPARIN SOD (PORK) LOCK FLUSH 100 UNIT/ML IV SOLN
500.0000 [IU] | Freq: Once | INTRAVENOUS | Status: AC
  Administered 2014-12-25: 500 [IU] via INTRAVENOUS
  Filled 2014-12-25: qty 5

## 2014-12-25 MED ORDER — SODIUM CHLORIDE 0.9 % IJ SOLN
10.0000 mL | INTRAMUSCULAR | Status: DC | PRN
  Administered 2014-12-25: 10 mL via INTRAVENOUS
  Filled 2014-12-25: qty 10

## 2014-12-25 NOTE — Patient Instructions (Signed)

## 2015-03-01 ENCOUNTER — Other Ambulatory Visit: Payer: Self-pay | Admitting: Oncology

## 2015-03-01 ENCOUNTER — Telehealth: Payer: Self-pay | Admitting: Oncology

## 2015-03-01 NOTE — Telephone Encounter (Signed)
Called and spoke with patient and rescheduled her appointments per dr Marko Plume for chemo pt  [name]

## 2015-03-11 ENCOUNTER — Ambulatory Visit
Admission: RE | Admit: 2015-03-11 | Discharge: 2015-03-11 | Disposition: A | Discharge status: Home / Self Care | Payer: Medicare Other | Source: Ambulatory Visit | Attending: Oncology | Admitting: Oncology

## 2015-03-11 ENCOUNTER — Other Ambulatory Visit: Payer: BLUE CROSS/BLUE SHIELD

## 2015-03-11 ENCOUNTER — Ambulatory Visit: Payer: BLUE CROSS/BLUE SHIELD | Admitting: Oncology

## 2015-03-11 DIAGNOSIS — Z1231 Encounter for screening mammogram for malignant neoplasm of breast: Secondary | ICD-10-CM

## 2015-03-18 ENCOUNTER — Ambulatory Visit: Payer: BLUE CROSS/BLUE SHIELD | Admitting: Oncology

## 2015-03-18 ENCOUNTER — Other Ambulatory Visit: Payer: BLUE CROSS/BLUE SHIELD

## 2015-03-24 ENCOUNTER — Other Ambulatory Visit: Payer: Self-pay | Admitting: Oncology

## 2015-03-28 ENCOUNTER — Other Ambulatory Visit (HOSPITAL_BASED_OUTPATIENT_CLINIC_OR_DEPARTMENT_OTHER): Payer: Medicare Other

## 2015-03-28 ENCOUNTER — Ambulatory Visit (HOSPITAL_BASED_OUTPATIENT_CLINIC_OR_DEPARTMENT_OTHER): Payer: Medicare Other | Admitting: Oncology

## 2015-03-28 ENCOUNTER — Ambulatory Visit (HOSPITAL_BASED_OUTPATIENT_CLINIC_OR_DEPARTMENT_OTHER): Payer: Medicare Other

## 2015-03-28 ENCOUNTER — Telehealth: Payer: Self-pay | Admitting: Oncology

## 2015-03-28 ENCOUNTER — Encounter: Payer: Self-pay | Admitting: Oncology

## 2015-03-28 VITALS — BP 115/57 | HR 70 | Temp 98.2°F | Resp 18 | Ht 67.0 in | Wt 158.8 lb

## 2015-03-28 DIAGNOSIS — Z8543 Personal history of malignant neoplasm of ovary: Secondary | ICD-10-CM

## 2015-03-28 DIAGNOSIS — Z853 Personal history of malignant neoplasm of breast: Secondary | ICD-10-CM

## 2015-03-28 DIAGNOSIS — G620 Drug-induced polyneuropathy: Secondary | ICD-10-CM

## 2015-03-28 DIAGNOSIS — Z95828 Presence of other vascular implants and grafts: Secondary | ICD-10-CM

## 2015-03-28 DIAGNOSIS — Z1501 Genetic susceptibility to malignant neoplasm of breast: Secondary | ICD-10-CM | POA: Diagnosis not present

## 2015-03-28 DIAGNOSIS — C569 Malignant neoplasm of unspecified ovary: Secondary | ICD-10-CM

## 2015-03-28 DIAGNOSIS — I890 Lymphedema, not elsewhere classified: Secondary | ICD-10-CM | POA: Diagnosis not present

## 2015-03-28 DIAGNOSIS — Z1509 Genetic susceptibility to other malignant neoplasm: Principal | ICD-10-CM

## 2015-03-28 LAB — CBC WITH DIFFERENTIAL/PLATELET
BASO%: 0.7 % (ref 0.0–2.0)
Basophils Absolute: 0 10*3/uL (ref 0.0–0.1)
EOS%: 2.3 % (ref 0.0–7.0)
Eosinophils Absolute: 0.1 10*3/uL (ref 0.0–0.5)
HEMATOCRIT: 44.1 % (ref 34.8–46.6)
HEMOGLOBIN: 15 g/dL (ref 11.6–15.9)
LYMPH#: 1 10*3/uL (ref 0.9–3.3)
LYMPH%: 16.4 % (ref 14.0–49.7)
MCH: 31.9 pg (ref 25.1–34.0)
MCHC: 33.9 g/dL (ref 31.5–36.0)
MCV: 94.1 fL (ref 79.5–101.0)
MONO#: 0.5 10*3/uL (ref 0.1–0.9)
MONO%: 7.8 % (ref 0.0–14.0)
NEUT%: 72.8 % (ref 38.4–76.8)
NEUTROS ABS: 4.6 10*3/uL (ref 1.5–6.5)
Platelets: 203 10*3/uL (ref 145–400)
RBC: 4.69 10*6/uL (ref 3.70–5.45)
RDW: 12.4 % (ref 11.2–14.5)
WBC: 6.3 10*3/uL (ref 3.9–10.3)

## 2015-03-28 LAB — COMPREHENSIVE METABOLIC PANEL (CC13)
ALBUMIN: 3.5 g/dL (ref 3.5–5.0)
ALK PHOS: 74 U/L (ref 40–150)
ALT: 15 U/L (ref 0–55)
AST: 11 U/L (ref 5–34)
Anion Gap: 7 mEq/L (ref 3–11)
BUN: 16.2 mg/dL (ref 7.0–26.0)
CALCIUM: 9.3 mg/dL (ref 8.4–10.4)
CO2: 25 mEq/L (ref 22–29)
CREATININE: 0.9 mg/dL (ref 0.6–1.1)
Chloride: 108 mEq/L (ref 98–109)
EGFR: 63 mL/min/{1.73_m2} — ABNORMAL LOW (ref >90)
Glucose: 121 mg/dl (ref 70–140)
Potassium: 3.7 mEq/L (ref 3.5–5.1)
SODIUM: 139 meq/L (ref 136–145)
Total Bilirubin: 0.59 mg/dL (ref 0.20–1.20)
Total Protein: 6.5 g/dL (ref 6.4–8.3)

## 2015-03-28 MED ORDER — HEPARIN SOD (PORK) LOCK FLUSH 100 UNIT/ML IV SOLN
500.0000 [IU] | Freq: Once | INTRAVENOUS | Status: AC
  Administered 2015-03-28: 500 [IU] via INTRAVENOUS
  Filled 2015-03-28: qty 5

## 2015-03-28 MED ORDER — SODIUM CHLORIDE 0.9 % IJ SOLN
10.0000 mL | INTRAMUSCULAR | Status: DC | PRN
  Administered 2015-03-28: 10 mL via INTRAVENOUS
  Filled 2015-03-28: qty 10

## 2015-03-28 NOTE — Progress Notes (Signed)
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 She has been on observation thru this office since last treated for ovarian cancer in 2008. She has left mastectomy with reconstruction; she had right tomo mammogram at [place] 03-11-15, with subpectoral implant and scattered densities and no mammographic findings of concern.   She has kept PAC since 2008, tho she is willing to have this removed now per our discussion today. Dr Margot Chimes had placed this PAC.  She has refused to stop estradiol, which she has used x years for hot flashes, this preceding BRCA 1 and cancer diagnoses. She had reluctantly decreased this to every other day when I saw her last, but tells me that she has resumed this daily due to concern that her memory was not as good on lower dose, tho she cannot tell improvement in memory since increasing dose (see discussion below).  Patient has felt generally well since she was here last, with no complaints that seem referable to breast or ovarian cancer history. She has had no recent infectious illness. No new or different pain. Some bowel urgency since starting magnesium supplements, which she will try holding. No abdominal or pelvic discomfort, no N/V. Bladder ok. No LE swelling. No bleeding. No respiratory symptoms. No other neurologic symptoms. No problems with PAC Review of systems as above, remainder of 10 point Review of Systems negative.   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 She was intolerant of tamoxifen. She has a subpectoral implant on right.  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 She had recurrence in March 2008, with surgery then by Dr Margot Chimes for 4 cm mass along colon and left pelvic node. (That pathology was ER PR negative.) Patient recalls that only symptom of recurrence was burning discomfort in LLQ. She had additional 6 cycles of taxol carboplatin from April - July 2008 and has had no known active disease since then. Per patient, ca 2729 marker was elevated with the recurrent ovarian cancer, with normal ca125; that information not available in this EMR.   Objective:  Vital signs in last 24 hours:  BP 115/57 mmHg  Pulse 70  Temp(Src) 98.2 F (36.8 C) (Oral)  Resp 18  Ht '5\' 7"'  (1.702 m)  Wt 158 lb 12.8 oz (72.031 kg)  BMI 24.87 kg/m2  SpO2 98% Weight stable Alert, oriented and appropriate. Ambulatory without difficulty. Respirations not labored. Looks comfortable   HEENT:PERRL, sclerae not icteric. Oral mucosa moist without lesions, posterior pharynx clear.  Neck supple. No JVD.  Lymphatics:no cervical,supraclavicular, axillary or inguinal adenopathy Resp: clear to auscultation bilaterally and normal percussion bilaterally Cardio: regular rate and rhythm. No gallop. GI: soft, nontender, not distended, no mass or organomegaly. Normally active bowel sounds. Surgical incision  not remarkable. Musculoskeletal/ Extremities: without pitting edema, cords, tenderness Neuro: speech fluent and appropriate, excellent historian.  Otherwise nonfocal. PSYCH appropriate mood and affect. Skin without rash,  ecchymosis, petechiae Breasts: Reconstruction on left without evidence of local recurrence. Right without dominant mass, skin or nipple findings. Axillae benign. Portacath-without erythema or tenderness  Lab Results:  Results for orders placed or performed in visit on 03/28/15  CBC with Differential  Result Value Ref Range   WBC 6.3 3.9 - 10.3 10e3/uL   NEUT# 4.6 1.5 - 6.5 10e3/uL   HGB 15.0 11.6 - 15.9 g/dL   HCT 44.1 34.8 - 46.6 %   Platelets 203 145 - 400 10e3/uL   MCV 94.1 79.5 - 101.0 fL   MCH 31.9 25.1 - 34.0 pg   MCHC 33.9 31.5 - 36.0 g/dL   RBC 4.69 3.70 - 5.45 10e6/uL   RDW 12.4 11.2 - 14.5 %   lymph# 1.0 0.9 - 3.3 10e3/uL   MONO# 0.5 0.1 - 0.9 10e3/uL   Eosinophils Absolute 0.1 0.0 - 0.5 10e3/uL   Basophils Absolute 0.0 0.0 - 0.1 10e3/uL   NEUT% 72.8 38.4 - 76.8 %   LYMPH% 16.4 14.0 - 49.7 %   MONO% 7.8 0.0 - 14.0 %   EOS% 2.3 0.0 - 7.0 %   BASO% 0.7 0.0 - 2.0 %   CMET available after visit entirely normal, with creat 0.9 (EGFR calculated at 63) CA 125   7, stable CA 2729  22, stable  Studies/Results: DIGITAL SCREENING UNILATERAL RIGHT MAMMOGRAM WITH IMPLANTS, TOMO AND CAD   [facility]  The patient has a retropectoral implant. Standard and implant displaced views were performed.  COMPARISON: Previous exam(s).  ACR Breast Density Category b: There are scattered areas of fibroglandular density.  FINDINGS: The patient has had a left mastectomy. There are no findings suspicious for malignancy.  Mammographic images were processed with CAD.  IMPRESSION: No mammographic evidence of malignancy. A result letter of this screening mammogram will be mailed directly to the patient.  RECOMMENDATION: Screening mammogram in one year. ([id number])  BI-RADS CATEGORY 1: Negative    Medications: I have reviewed the patient's current medications. See below re estradiol  DISCUSSION: Patient tells me that husband is "superstitious" and has not  wanted PAC removed "because the cancer came back 2 years after it was removed last time"; she does tell me that she would very much like it removed. We have discussed risks of foreign body that is in direct contact with blood stream and requires regular access for flushing. I have told her that vascular surgeons strongly recommend removal of PACs when no longer needed, which is also my recommendation now. She will discuss with husband and is in agreement with referral to general surgeon to discuss removal. I have also talked with her again about concerns related to estradiol, given BRCA 1 abnormality particularly and personal history of breast and ovarian cancers. Patient felt that, because the DCIS was not ER + , she was safe to be on supplemental estrogen. I have told her again that just from standpoint of the BRCA 1 and length of time on supplemental estrogen, that I would strongly encourage her to stop this medication. She did seem to follow the discussion better today, tho did not agree to DC estrogen at least now.  Assessment/Plan: 1. Ovarian carcinoma: initially diagnosed 10 and recurrent 2008, treatment as above, clinically doing well. We will let her know marker results as above. Referral to Dr [name]  Ross as Dr Gertie Fey has retired, last gyn exam ~ Dec 2015 and no longer followed by gyn oncology. 2.intraductal carcinoma left breast 1992, post mastectomy and 13 node axillary dissection. Continue yearly tomo right mammogram due in Oct; unable to tolerate positioning for breast MRI. 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 Referral to general surgery, as this was placed by Dr Margot Chimes 2--8. 6.declines flu vaccine  8.mild lymphedema LUE: saw lymphedema PT in past. Stable and not bothersome. 9.  At least 20 years on supplemental estrogen: I have again recommended that she stop this medication, particularly given known risk factors for breast and gyn cancers. Discussions with PCP and/or gyn may be helpful for her also.   All questions answered. As she will probably see gyn in ~ Dec, I will see her 6 months later, then may be able to alternate visits at this office and with gyn every 6 months. Time spent 25 min including >50% counseling and coordination of care. Cc Drs Raliegh Ip.Ross and to general surgery    Gordy Levan, MD   03/28/2015, 2:35 PM

## 2015-03-28 NOTE — Telephone Encounter (Signed)
Appointments made and avs printed for patient,will send inbox to [facility] ever[facility] and gave her the number to call for a gyn phy appointment

## 2015-03-29 ENCOUNTER — Telehealth: Payer: Self-pay

## 2015-03-29 LAB — CA 125: CA 125: 7 U/mL (ref <35)

## 2015-03-29 LAB — CANCER ANTIGEN 27.29: CA 27.29: 22 U/mL (ref 0–39)

## 2015-03-29 NOTE — Telephone Encounter (Signed)
-----   Message from Gordy Levan, MD sent at 03/29/2015  3:32 PM EDT ----- Labs seen and need follow up: please let her know CA 2729 normal at 22 and CA 125 normal at 7

## 2015-03-29 NOTE — Telephone Encounter (Signed)
Told [name] the results of the Tumor markers as noted below by Dr. Marko Plume.  [name] verbalized understanding.

## 2015-04-01 ENCOUNTER — Telehealth: Payer: Self-pay | Admitting: Oncology

## 2015-04-01 NOTE — Telephone Encounter (Signed)
Recd a call for appointment with dr Zella Richer at [facility] 04/10/15 10:00 and patient is aware

## 2015-04-02 ENCOUNTER — Telehealth: Payer: Self-pay

## 2015-04-02 NOTE — Telephone Encounter (Signed)
Faxed 03-28-15 office note to Dr. Barkley Bruns for appointment 04-10-15 to discuss PAC removal.

## 2015-04-02 NOTE — Telephone Encounter (Signed)
-----   Message from Gordy Levan, MD sent at 03/30/2015 12:11 PM EDT ----- Cc my note 11-3 to whichever surgeon at [facility] is going to see her  thanks

## 2015-04-10 ENCOUNTER — Encounter (HOSPITAL_BASED_OUTPATIENT_CLINIC_OR_DEPARTMENT_OTHER): Payer: Self-pay | Admitting: *Deleted

## 2015-04-10 ENCOUNTER — Ambulatory Visit: Payer: Self-pay | Admitting: General Surgery

## 2015-04-10 DIAGNOSIS — Z8543 Personal history of malignant neoplasm of ovary: Secondary | ICD-10-CM | POA: Diagnosis not present

## 2015-04-10 DIAGNOSIS — Z1502 Genetic susceptibility to malignant neoplasm of ovary: Secondary | ICD-10-CM | POA: Diagnosis not present

## 2015-04-10 DIAGNOSIS — Z853 Personal history of malignant neoplasm of breast: Secondary | ICD-10-CM | POA: Diagnosis not present

## 2015-04-10 DIAGNOSIS — Z95828 Presence of other vascular implants and grafts: Secondary | ICD-10-CM | POA: Diagnosis not present

## 2015-04-10 DIAGNOSIS — Z1501 Genetic susceptibility to malignant neoplasm of breast: Secondary | ICD-10-CM | POA: Diagnosis not present

## 2015-04-10 NOTE — H&P (Signed)
[name]. [place] 04/10/2015 10:39 AM Location: [facility] Patient #: [id number] DOB: [birth year] Undefined / Language: [name] / Race: White Female  History of Present Illness Jessica Hollingshead MD; 04/10/2015 10:59 AM) The patient is a 74 year old female.   Note:She is referred by Dr. Nigel Berthold discussed Port-A-Cath removal. She has a history of ovarian cancer and breast cancer. She is BRCA1 positive. She's had the indwelling Port-A-Cath for 8 years and it is no longer needed. It is on the right side. She's had no complications from it up to this time.  As a side note, she states she's had problems with more frequent and loose bowel movements for the past few months. This seems to have correlated when she started taking magnesium. She's been off the magnesium supplement for about a month and the situation is slowly improving.  Diagnostic Studies History [name], [place]; 04/10/2015 10:39 AM) Mammogram within last year  Allergies [name], CMA; 04/10/2015 10:40 AM) Codeine Sulfate *ANALGESICS - OPIOID* Promethazine Plain/Codeine *COUGH/COLD/ALLERGY*  Medication History [name], CMA; 04/10/2015 10:40 AM) Estradiol (1MG Tablet, Oral) Active. Levothyroxine Sodium (88MCG Tablet, Oral) Active. Multiple Vitamin (Oral) Active. Medications Reconciled     Review of Systems [name] CMA; 04/10/2015 10:39 AM) General Not Present- Appetite Loss, Chills, Fatigue, Fever, Night Sweats, Weight Gain and Weight Loss. Skin Not Present- Change in Wart/Mole, Dryness, Hives, Jaundice, New Lesions, Non-Healing Wounds, Rash and Ulcer. HEENT Present- Wears glasses/contact lenses. Not Present- Earache, Hearing Loss, Hoarseness, Nose Bleed, Oral Ulcers, Ringing in the Ears, Seasonal Allergies, Sinus Pain, Sore Throat, Visual Disturbances and Yellow Eyes. Gastrointestinal Not Present- Abdominal Pain, Bloating, Bloody Stool, Change in Bowel Habits, Chronic diarrhea,  Constipation, Difficulty Swallowing, Excessive gas, Gets full quickly at meals, Hemorrhoids, Indigestion, Nausea, Rectal Pain and Vomiting. Female Genitourinary Not Present- Frequency, Nocturia, Painful Urination, Pelvic Pain and Urgency. Psychiatric Not Present- Anxiety, Bipolar, Change in Sleep Pattern, Depression, Fearful and Frequent crying. Endocrine Not Present- Cold Intolerance, Excessive Hunger, Hair Changes, Heat Intolerance, Hot flashes and New Diabetes.  Vitals [name] CMA; 04/10/2015 10:40 AM) 04/10/2015 10:40 AM Weight: 157.6 lb Height: 68in Body Surface Area: 1.85 m Body Mass Index: 23.96 kg/m  Temp.: 97.81F(Temporal)  Pulse: 78 (Regular)  BP: 126/76 (Sitting, Left Arm, Standard)      Physical Exam Jessica Hollingshead MD; 04/10/2015 10:59 AM)  The physical exam findings are as follows: Note:General-well-developed well-nourished distress.  Chest-palpable Port-A-Cath with scar in right upper chest wall.    Assessment & Plan Jessica Hollingshead MD; 04/10/2015 11:00 AM)  [name]) Impression: This has been in place for 8 years and is no longer needed. As for the changes in her bowel habits, the seemed to have improved somewhat once she stopped her magnesium oxide supplements.  Plan: Port-A-Cath removal. The procedure and risks have been discussed with her. Risks include but are not limited to bleeding, infection, wound problems, adverse reaction to anesthesia. She seemed to understand and agrees with the plan.  [name], MD

## 2015-04-15 ENCOUNTER — Ambulatory Visit (HOSPITAL_BASED_OUTPATIENT_CLINIC_OR_DEPARTMENT_OTHER)
Admission: RE | Admit: 2015-04-15 | Discharge: 2015-04-15 | Disposition: A | Discharge status: Home / Self Care | Payer: Medicare Other | Source: Ambulatory Visit | Attending: General Surgery | Admitting: General Surgery

## 2015-04-15 ENCOUNTER — Encounter (HOSPITAL_BASED_OUTPATIENT_CLINIC_OR_DEPARTMENT_OTHER)
Admission: RE | Disposition: A | Discharge status: Home / Self Care | Payer: Self-pay | Source: Ambulatory Visit | Attending: General Surgery

## 2015-04-15 ENCOUNTER — Ambulatory Visit (HOSPITAL_BASED_OUTPATIENT_CLINIC_OR_DEPARTMENT_OTHER): Payer: Medicare Other | Admitting: Anesthesiology

## 2015-04-15 ENCOUNTER — Encounter (HOSPITAL_BASED_OUTPATIENT_CLINIC_OR_DEPARTMENT_OTHER): Payer: Self-pay | Admitting: Anesthesiology

## 2015-04-15 DIAGNOSIS — Z452 Encounter for adjustment and management of vascular access device: Secondary | ICD-10-CM | POA: Diagnosis not present

## 2015-04-15 DIAGNOSIS — Z853 Personal history of malignant neoplasm of breast: Secondary | ICD-10-CM | POA: Insufficient documentation

## 2015-04-15 DIAGNOSIS — Z8543 Personal history of malignant neoplasm of ovary: Secondary | ICD-10-CM | POA: Diagnosis not present

## 2015-04-15 DIAGNOSIS — Z87891 Personal history of nicotine dependence: Secondary | ICD-10-CM | POA: Insufficient documentation

## 2015-04-15 DIAGNOSIS — E039 Hypothyroidism, unspecified: Secondary | ICD-10-CM | POA: Insufficient documentation

## 2015-04-15 HISTORY — PX: PORT-A-CATH REMOVAL: SHX5289

## 2015-04-15 SURGERY — REMOVAL PORT-A-CATH
Anesthesia: Monitor Anesthesia Care | Site: Chest | Laterality: Right

## 2015-04-15 MED ORDER — SCOPOLAMINE 1 MG/3DAYS TD PT72
MEDICATED_PATCH | TRANSDERMAL | Status: AC
  Filled 2015-04-15: qty 1

## 2015-04-15 MED ORDER — GLYCOPYRROLATE 0.2 MG/ML IJ SOLN
INTRAMUSCULAR | Status: DC | PRN
  Administered 2015-04-15: 0.2 mg via INTRAVENOUS

## 2015-04-15 MED ORDER — DEXAMETHASONE SODIUM PHOSPHATE 10 MG/ML IJ SOLN
INTRAMUSCULAR | Status: AC
  Filled 2015-04-15: qty 1

## 2015-04-15 MED ORDER — ONDANSETRON HCL 4 MG/2ML IJ SOLN: 4.0000 mg | Freq: Once | INTRAMUSCULAR | Status: DC | PRN

## 2015-04-15 MED ORDER — FENTANYL CITRATE (PF) 100 MCG/2ML IJ SOLN: 50.0000 ug | INTRAMUSCULAR | Status: DC | PRN

## 2015-04-15 MED ORDER — SODIUM BICARBONATE 4 % IV SOLN
INTRAVENOUS | Status: DC | PRN
Start: 2015-04-15 — End: 2015-04-15
  Administered 2015-04-15: 17 mL

## 2015-04-15 MED ORDER — LACTATED RINGERS IV SOLN
INTRAVENOUS | Status: DC
  Administered 2015-04-15 (×2): via INTRAVENOUS

## 2015-04-15 MED ORDER — PROPOFOL 10 MG/ML IV BOLUS
INTRAVENOUS | Status: AC
  Filled 2015-04-15: qty 40

## 2015-04-15 MED ORDER — MIDAZOLAM HCL 5 MG/5ML IJ SOLN
INTRAMUSCULAR | Status: DC | PRN
  Administered 2015-04-15 (×2): 1 mg via INTRAVENOUS

## 2015-04-15 MED ORDER — MIDAZOLAM HCL 2 MG/2ML IJ SOLN
INTRAMUSCULAR | Status: AC
  Filled 2015-04-15: qty 2

## 2015-04-15 MED ORDER — SODIUM BICARBONATE 4 % IV SOLN
INTRAVENOUS | Status: AC
  Filled 2015-04-15: qty 5

## 2015-04-15 MED ORDER — LIDOCAINE HCL (CARDIAC) 20 MG/ML IV SOLN
INTRAVENOUS | Status: AC
  Filled 2015-04-15: qty 5

## 2015-04-15 MED ORDER — ONDANSETRON HCL 4 MG/2ML IJ SOLN
INTRAMUSCULAR | Status: DC | PRN
  Administered 2015-04-15: 4 mg via INTRAVENOUS

## 2015-04-15 MED ORDER — LIDOCAINE HCL (PF) 1 % IJ SOLN
INTRAMUSCULAR | Status: AC
  Filled 2015-04-15: qty 30

## 2015-04-15 MED ORDER — HYDROMORPHONE HCL 1 MG/ML IJ SOLN: 0.2500 mg | INTRAMUSCULAR | Status: DC | PRN

## 2015-04-15 MED ORDER — SODIUM CHLORIDE 0.9 % IJ SOLN: 3.0000 mL | INTRAMUSCULAR | Status: DC | PRN

## 2015-04-15 MED ORDER — ONDANSETRON HCL 4 MG PO TABS: 4.0000 mg | ORAL_TABLET | Freq: Four times a day (QID) | ORAL | Status: DC | PRN

## 2015-04-15 MED ORDER — ACETAMINOPHEN 650 MG RE SUPP: 650.0000 mg | RECTAL | Status: DC | PRN

## 2015-04-15 MED ORDER — PROPOFOL 10 MG/ML IV BOLUS
INTRAVENOUS | Status: DC | PRN
  Administered 2015-04-15: 30 mg via INTRAVENOUS
  Administered 2015-04-15 (×2): 20 mg via INTRAVENOUS
  Administered 2015-04-15: 30 mg via INTRAVENOUS

## 2015-04-15 MED ORDER — FENTANYL CITRATE (PF) 100 MCG/2ML IJ SOLN
INTRAMUSCULAR | Status: DC | PRN
  Administered 2015-04-15 (×2): 25 ug via INTRAVENOUS

## 2015-04-15 MED ORDER — CEFAZOLIN SODIUM-DEXTROSE 2-3 GM-% IV SOLR
2.0000 g | INTRAVENOUS | Status: AC
  Administered 2015-04-15: 2 g via INTRAVENOUS

## 2015-04-15 MED ORDER — LIDOCAINE HCL (CARDIAC) 20 MG/ML IV SOLN
INTRAVENOUS | Status: DC | PRN
  Administered 2015-04-15: 10 mg via INTRAVENOUS

## 2015-04-15 MED ORDER — GLYCOPYRROLATE 0.2 MG/ML IJ SOLN: 0.2000 mg | Freq: Once | INTRAMUSCULAR | Status: DC | PRN

## 2015-04-15 MED ORDER — FENTANYL CITRATE (PF) 100 MCG/2ML IJ SOLN
INTRAMUSCULAR | Status: AC
Start: 2015-04-15 — End: 2015-04-15
  Filled 2015-04-15: qty 2

## 2015-04-15 MED ORDER — CEFAZOLIN SODIUM-DEXTROSE 2-3 GM-% IV SOLR
INTRAVENOUS | Status: AC
  Filled 2015-04-15: qty 50

## 2015-04-15 MED ORDER — TRAMADOL HCL 50 MG PO TABS: 50.0000 mg | ORAL_TABLET | Freq: Four times a day (QID) | ORAL | Status: DC | PRN

## 2015-04-15 MED ORDER — MIDAZOLAM HCL 2 MG/2ML IJ SOLN: 1.0000 mg | INTRAMUSCULAR | Status: DC | PRN

## 2015-04-15 MED ORDER — SCOPOLAMINE 1 MG/3DAYS TD PT72
1.0000 | MEDICATED_PATCH | Freq: Once | TRANSDERMAL | Status: AC | PRN
Start: 2015-04-15 — End: 2015-04-15
  Administered 2015-04-15: 1 via TRANSDERMAL

## 2015-04-15 MED ORDER — ACETAMINOPHEN 325 MG PO TABS: 650.0000 mg | ORAL_TABLET | ORAL | Status: DC | PRN

## 2015-04-15 MED ORDER — BUPIVACAINE-EPINEPHRINE (PF) 0.5% -1:200000 IJ SOLN
INTRAMUSCULAR | Status: AC
  Filled 2015-04-15: qty 30

## 2015-04-15 MED ORDER — MEPERIDINE HCL 25 MG/ML IJ SOLN: 6.2500 mg | INTRAMUSCULAR | Status: DC | PRN

## 2015-04-15 MED ORDER — ONDANSETRON HCL 4 MG/2ML IJ SOLN
INTRAMUSCULAR | Status: AC
  Filled 2015-04-15: qty 2

## 2015-04-15 SURGICAL SUPPLY — 44 items
APL SKNCLS STERI-STRIP NONHPOA (GAUZE/BANDAGES/DRESSINGS) ×1
BENZOIN TINCTURE PRP APPL 2/3 (GAUZE/BANDAGES/DRESSINGS) ×3 IMPLANT
BLADE SURG 15 STRL LF DISP TIS (BLADE) ×1 IMPLANT
BLADE SURG 15 STRL SS (BLADE) ×3
CHLORAPREP W/TINT 26ML (MISCELLANEOUS) ×3 IMPLANT
CLEANER CAUTERY TIP 5X5 PAD (MISCELLANEOUS) ×1 IMPLANT
CLOSURE WOUND 1/2 X4 (GAUZE/BANDAGES/DRESSINGS) ×1
COVER BACK TABLE 60X90IN (DRAPES) ×3 IMPLANT
COVER MAYO STAND STRL (DRAPES) ×3 IMPLANT
DECANTER SPIKE VIAL GLASS SM (MISCELLANEOUS) ×1 IMPLANT
DRAPE LAPAROTOMY 100X72 PEDS (DRAPES) ×3 IMPLANT
DRAPE UTILITY XL STRL (DRAPES) ×2 IMPLANT
DRSG TEGADERM 2-3/8X2-3/4 SM (GAUZE/BANDAGES/DRESSINGS) ×2 IMPLANT
DRSG TEGADERM 4X4.75 (GAUZE/BANDAGES/DRESSINGS) ×2 IMPLANT
DRSG TELFA 3X8 NADH (GAUZE/BANDAGES/DRESSINGS) IMPLANT
ELECT REM PT RETURN 9FT ADLT (ELECTROSURGICAL) ×3
ELECTRODE REM PT RTRN 9FT ADLT (ELECTROSURGICAL) ×1 IMPLANT
GLOVE BIOGEL PI IND STRL 7.0 (GLOVE) IMPLANT
GLOVE BIOGEL PI IND STRL 8.5 (GLOVE) ×1 IMPLANT
GLOVE BIOGEL PI INDICATOR 7.0 (GLOVE) ×2
GLOVE BIOGEL PI INDICATOR 8.5 (GLOVE) ×2
GLOVE ECLIPSE 6.5 STRL STRAW (GLOVE) ×2 IMPLANT
GLOVE ECLIPSE 8.0 STRL XLNG CF (GLOVE) ×3 IMPLANT
GOWN STRL REUS W/ TWL LRG LVL3 (GOWN DISPOSABLE) ×1 IMPLANT
GOWN STRL REUS W/TWL LRG LVL3 (GOWN DISPOSABLE) ×6
NDL HYPO 25X1 1.5 SAFETY (NEEDLE) ×1 IMPLANT
NEEDLE HYPO 25X1 1.5 SAFETY (NEEDLE) ×3 IMPLANT
PACK BASIN DAY SURGERY FS (CUSTOM PROCEDURE TRAY) ×3 IMPLANT
PAD CLEANER CAUTERY TIP 5X5 (MISCELLANEOUS)
PAD DRESSING TELFA 3X8 NADH (GAUZE/BANDAGES/DRESSINGS) IMPLANT
PENCIL BUTTON HOLSTER BLD 10FT (ELECTRODE) ×3 IMPLANT
SLEEVE SCD COMPRESS KNEE MED (MISCELLANEOUS) IMPLANT
SPONGE GAUZE 2X2 8PLY STER LF (GAUZE/BANDAGES/DRESSINGS) ×1
SPONGE GAUZE 2X2 8PLY STRL LF (GAUZE/BANDAGES/DRESSINGS) ×2 IMPLANT
STRIP CLOSURE SKIN 1/2X4 (GAUZE/BANDAGES/DRESSINGS) ×2 IMPLANT
SUT MON AB 4-0 PC3 18 (SUTURE) ×3 IMPLANT
SUT SILK 0 TIES 10X30 (SUTURE) ×2 IMPLANT
SUT VIC AB 3-0 SH 27 (SUTURE) ×3
SUT VIC AB 3-0 SH 27X BRD (SUTURE) IMPLANT
SUT VIC AB 4-0 SH 27 (SUTURE) ×3
SUT VIC AB 4-0 SH 27XANBCTRL (SUTURE) ×1 IMPLANT
SYR CONTROL 10ML LL (SYRINGE) ×3 IMPLANT
TOWEL OR 17X24 6PK STRL BLUE (TOWEL DISPOSABLE) ×4 IMPLANT
TOWEL OR NON WOVEN STRL DISP B (DISPOSABLE) ×3 IMPLANT

## 2015-04-15 NOTE — Transfer of Care (Signed)
Immediate Anesthesia Transfer of Care Note  Patient: [name]  Procedure(s) Performed: Procedure(s) with comments: REMOVAL PORT-A-CATH (Right) - Right upper chest  Patient Location: PACU  Anesthesia Type:MAC  Level of Consciousness: awake and patient cooperative  Airway & Oxygen Therapy: Patient Spontanous Breathing and Patient connected to face mask oxygen  Post-op Assessment: Report given to RN and Post -op Vital signs reviewed and stable  Post vital signs: Reviewed and stable  Last Vitals:  Filed Vitals:   04/15/15 0812  BP: 120/58  Pulse: 60  Temp: 36.5 C  Resp: 20    Complications: No apparent anesthesia complications

## 2015-04-15 NOTE — Anesthesia Preprocedure Evaluation (Signed)
Anesthesia Evaluation  Patient identified by MRN, date of birth, ID band Patient awake    Reviewed: Allergy & Precautions, NPO status , Patient's Chart, lab work & pertinent test results  Airway Mallampati: II  TM Distance: >3 FB Neck ROM: Full    Dental no notable dental hx.    Pulmonary neg pulmonary ROS, former smoker,    Pulmonary exam normal breath sounds clear to auscultation       Cardiovascular negative cardio ROS Normal cardiovascular exam Rhythm:Regular Rate:Normal     Neuro/Psych negative neurological ROS  negative psych ROS   GI/Hepatic negative GI ROS, Neg liver ROS,   Endo/Other  Hypothyroidism   Renal/GU negative Renal ROS     Musculoskeletal negative musculoskeletal ROS (+)   Abdominal   Peds  Hematology negative hematology ROS (+)   Anesthesia Other Findings   Reproductive/Obstetrics negative OB ROS                             Anesthesia Physical Anesthesia Plan  ASA: II  Anesthesia Plan: MAC   Post-op Pain Management:    Induction: Intravenous  Airway Management Planned:   Additional Equipment:   Intra-op Plan:   Post-operative Plan:   Informed Consent: I have reviewed the patients History and Physical, chart, labs and discussed the procedure including the risks, benefits and alternatives for the proposed anesthesia with the patient or authorized representative who has indicated his/her understanding and acceptance.   Dental advisory given  Plan Discussed with: CRNA  Anesthesia Plan Comments:         Anesthesia Quick Evaluation

## 2015-04-15 NOTE — Interval H&P Note (Signed)
History and Physical Interval Note:  04/15/2015 9:04 AM  [name]  has presented today for surgery, with the diagnosis of Indwelling Port-a-cath  The various methods of treatment have been discussed with the patient and family. After consideration of risks, benefits and other options for treatment, the patient has consented to  Procedure(s): REMOVAL PORT-A-CATH (N/A) as a surgical intervention .  The patient's history has been reviewed, patient examined, no change in status, stable for surgery.  I have reviewed the patient's chart and labs.  Questions were answered to the patient's satisfaction.     Berlene Dixson Lenna Sciara

## 2015-04-15 NOTE — Discharge Instructions (Addendum)
Sit upright for 4-6 hours today.     *Don't lie flat for one week (elevate head of bed with pillows)  Applied ice to the area for the next 2-3 days.  Light activities with right arm for 1 week.  May shower tomorrow. Remove bandages in 3 days. Leave Steri-Strips on until they fall off.  Take pain medication and medication for nausea as needed.  Liquid diet today and then solid foods tomorrow.  Office appointment in one month, please call [phone] to make appointment.  Call for heavy bleeding or other wound problems.   Post Anesthesia Home Care Instructions  Activity: Get plenty of rest for the remainder of the day. A responsible adult should stay with you for 24 hours following the procedure.  For the next 24 hours, DO NOT: -Drive a car -Paediatric nurse -Drink alcoholic beverages -Take any medication unless instructed by your physician -Make any legal decisions or sign important papers.  Meals: Start with liquid foods such as gelatin or soup. Progress to regular foods as tolerated. Avoid greasy, spicy, heavy foods. If nausea and/or vomiting occur, drink only clear liquids until the nausea and/or vomiting subsides. Call your physician if vomiting continues.  Special Instructions/Symptoms: Your throat may feel dry or sore from the anesthesia or the breathing tube placed in your throat during surgery. If this causes discomfort, gargle with warm salt water. The discomfort should disappear within 24 hours.  If you had a scopolamine patch placed behind your ear for the management of post- operative nausea and/or vomiting:  1. The medication in the patch is effective for 72 hours, after which it should be removed.  Wrap patch in a tissue and discard in the trash. Wash hands thoroughly with soap and water. 2. You may remove the patch earlier than 72 hours if you experience unpleasant side effects which may include dry mouth, dizziness or visual disturbances. 3. Avoid touching the  patch. Wash your hands with soap and water after contact with the patch.

## 2015-04-15 NOTE — Op Note (Signed)
Operative Note  [name] female 74 y.o. 04/15/2015  PREOPERATIVE DX:  Indwelling Port-A-Cath  POSTOPERATIVE DX:  Same  PROCEDURE:   Port-A-Cath removal with cut down to subclavian vein         Surgeon: [name]   Assistants: none  Anesthesia: Monitored Local Anesthesia with Sedation  Indications:   This is a 74 year old female who has had breast cancer and ovarian cancer. She's had an indwelling Port-A-Cath for 8 years while receiving treatments. She no longer needs to Port-A-Cath presents for removal. The Port-A-Cath is in the right subclavian position.    Procedure Detail:  She was seen in the holding area and brought to the operating room, placed supine on the operating table, and given intravenous sedation. The right upper chest wall and neck were sterilely prepped and draped. A timeout was performed.  Local anesthetic consisting of a mixture of Xylocaine and Marcaine was infiltrated on the right chest wall at the site of the scar for the port placement. The scar was opened up sharply and the subcutaneous tissue was divided with electrocautery until the port and catheter were isolated. The fibrous sheath around the catheter was removed. I attempted to pull the catheter out from the chest wall incision but met some resistance. I subsequently dissected the port completely free from the chest wall and removed it. I then tracked the catheter up toward the subclavian position trying to free up the fibrous sheath and remove it but met resistance with this.  I subsequently anesthetize the previous small right subclavian incision area and re-incised it and lengthen it. I dissected the subcutaneous tissue down until I could see the catheter. I then dissected the fibrous sheath free of it until I came to the insertion into the subclavian vein. With some gentle traction I was able to remove it and the fibrous sheath. Direct pressure was held at the site of the vein. The port  and the catheter were then completely removed.  While direct pressure was being held on the vein to the upper incision, the lower incision was closed in 2 layers. The subcutaneous tissue was approximated with running 3-0 Vicryl suture. The skin was closed with a 4-0 Monocryl subcuticular stitch.  The upper wound was inspected and hemostasis was adequate. The subcutaneous tissue of the upper wound was closed by approximated the subcutaneous tissue in 2 layers using running 3-0 Vicryl suture. The skin was closed with 4-0 Monocryl running subcuticular stitch. Steri-Strips and sterile dressings were applied to both wounds.  She tolerated the procedure well without any apparent complications and was taken to the recovery room in satisfactory condition. Estimated blood loss was less than 100 cc.

## 2015-04-15 NOTE — Anesthesia Postprocedure Evaluation (Signed)
Anesthesia Post Note  Patient: [name]  Procedure(s) Performed: Procedure(s) (LRB): REMOVAL PORT-A-CATH (Right)  Patient location during evaluation: PACU Anesthesia Type: MAC Level of consciousness: awake and alert Pain management: pain level controlled Vital Signs Assessment: post-procedure vital signs reviewed and stable Respiratory status: spontaneous breathing and nonlabored ventilation Cardiovascular status: stable and blood pressure returned to baseline Anesthetic complications: no    Last Vitals:  Filed Vitals:   04/15/15 1030 04/15/15 1045  BP: 117/69 127/68  Pulse: 63 59  Temp:    Resp: 16 13    Last Pain:  Filed Vitals:   04/15/15 1048  PainSc: 0-No pain                 Nolon Nations

## 2015-04-15 NOTE — H&P (View-Only) (Signed)
[name]. Somero 04/10/2015 10:39 AM Location: [facility] Patient #: [id number] DOB: Mar 14, 1941 Undefined / Language: [name] / Race: White Female  History of Present Illness Jessica Hollingshead MD; 04/10/2015 10:59 AM) The patient is a 74 year old female.   Note:She is referred by Dr. Nigel [name] discussed Port-A-Cath removal. She has a history of ovarian cancer and breast cancer. She is BRCA1 positive. She's had the indwelling Port-A-Cath for 8 years and it is no longer needed. It is on the right side. She's had no complications from it up to this time.  As a side note, she states she's had problems with more frequent and loose bowel movements for the past few months. This seems to have correlated when she started taking magnesium. She's been off the magnesium supplement for about a month and the situation is slowly improving.  Diagnostic Studies History [name], [place]; 04/10/2015 10:39 AM) Mammogram within last year  Allergies [name], CMA; 04/10/2015 10:40 AM) Codeine Sulfate *ANALGESICS - OPIOID* Promethazine Plain/Codeine *COUGH/COLD/ALLERGY*  Medication History [name], CMA; 04/10/2015 10:40 AM) Estradiol (1MG Tablet, Oral) Active. Levothyroxine Sodium (88MCG Tablet, Oral) Active. Multiple Vitamin (Oral) Active. Medications Reconciled     Review of Systems [name] CMA; 04/10/2015 10:39 AM) General Not Present- Appetite Loss, Chills, Fatigue, Fever, Night Sweats, Weight Gain and Weight Loss. Skin Not Present- Change in Wart/Mole, Dryness, Hives, Jaundice, New Lesions, Non-Healing Wounds, Rash and Ulcer. HEENT Present- Wears glasses/contact lenses. Not Present- Earache, Hearing Loss, Hoarseness, Nose Bleed, Oral Ulcers, Ringing in the Ears, Seasonal Allergies, Sinus Pain, Sore Throat, Visual Disturbances and Yellow Eyes. Gastrointestinal Not Present- Abdominal Pain, Bloating, Bloody Stool, Change in Bowel Habits, Chronic diarrhea,  Constipation, Difficulty Swallowing, Excessive gas, Gets full quickly at meals, Hemorrhoids, Indigestion, Nausea, Rectal Pain and Vomiting. Female Genitourinary Not Present- Frequency, Nocturia, Painful Urination, Pelvic Pain and Urgency. Psychiatric Not Present- Anxiety, Bipolar, Change in Sleep Pattern, Depression, Fearful and Frequent crying. Endocrine Not Present- Cold Intolerance, Excessive Hunger, Hair Changes, Heat Intolerance, Hot flashes and New Diabetes.  Vitals [name] CMA; 04/10/2015 10:40 AM) 04/10/2015 10:40 AM Weight: 157.6 lb Height: 68in Body Surface Area: 1.85 m Body Mass Index: 23.96 kg/m  Temp.: 97.7F(Temporal)  Pulse: 78 (Regular)  BP: 126/76 (Sitting, Left Arm, Standard)      Physical Exam Jessica Hollingshead MD; 04/10/2015 10:59 AM)  The physical exam findings are as follows: Note:General-well-developed well-nourished distress.  Chest-palpable Port-A-Cath with scar in right upper chest wall.    Assessment & Plan Jessica Hollingshead MD; 04/10/2015 11:00 AM)  [name] IN PLACE [phone] Impression: This has been in place for 8 years and is no longer needed. As for the changes in her bowel habits, the seemed to have improved somewhat once she stopped her magnesium oxide supplements.  Plan: Port-A-Cath removal. The procedure and risks have been discussed with her. Risks include but are not limited to bleeding, infection, wound problems, adverse reaction to anesthesia. She seemed to understand and agrees with the plan.  Jessica Confer, MD

## 2015-04-15 NOTE — Anesthesia Procedure Notes (Signed)
Procedure Name: MAC Date/Time: 04/15/2015 9:23 AM Performed by: Marrianne Mood Pre-anesthesia Checklist: Patient identified, Timeout performed, Emergency Drugs available, Suction available and Patient being monitored Patient Re-evaluated:Patient Re-evaluated prior to inductionOxygen Delivery Method: Simple face mask

## 2015-04-16 ENCOUNTER — Encounter (HOSPITAL_BASED_OUTPATIENT_CLINIC_OR_DEPARTMENT_OTHER): Payer: Self-pay | Admitting: General Surgery

## 2015-05-22 ENCOUNTER — Encounter: Payer: Self-pay | Admitting: *Deleted

## 2015-06-05 DIAGNOSIS — D225 Melanocytic nevi of trunk: Secondary | ICD-10-CM | POA: Diagnosis not present

## 2015-06-05 DIAGNOSIS — L814 Other melanin hyperpigmentation: Secondary | ICD-10-CM | POA: Diagnosis not present

## 2015-06-05 DIAGNOSIS — D485 Neoplasm of uncertain behavior of skin: Secondary | ICD-10-CM | POA: Diagnosis not present

## 2015-06-05 DIAGNOSIS — C44519 Basal cell carcinoma of skin of other part of trunk: Secondary | ICD-10-CM | POA: Diagnosis not present

## 2015-06-05 DIAGNOSIS — L820 Inflamed seborrheic keratosis: Secondary | ICD-10-CM | POA: Diagnosis not present

## 2015-06-05 DIAGNOSIS — L570 Actinic keratosis: Secondary | ICD-10-CM | POA: Diagnosis not present

## 2015-06-05 DIAGNOSIS — M791 Myalgia: Secondary | ICD-10-CM | POA: Diagnosis not present

## 2015-06-05 DIAGNOSIS — M9901 Segmental and somatic dysfunction of cervical region: Secondary | ICD-10-CM | POA: Diagnosis not present

## 2015-06-05 DIAGNOSIS — L821 Other seborrheic keratosis: Secondary | ICD-10-CM | POA: Diagnosis not present

## 2015-06-10 ENCOUNTER — Encounter: Payer: Self-pay | Admitting: Cardiovascular Disease

## 2015-07-15 ENCOUNTER — Other Ambulatory Visit: Payer: Self-pay | Admitting: Internal Medicine

## 2015-08-14 DIAGNOSIS — H25013 Cortical age-related cataract, bilateral: Secondary | ICD-10-CM | POA: Diagnosis not present

## 2015-08-14 DIAGNOSIS — H5203 Hypermetropia, bilateral: Secondary | ICD-10-CM | POA: Diagnosis not present

## 2015-08-14 DIAGNOSIS — H2513 Age-related nuclear cataract, bilateral: Secondary | ICD-10-CM | POA: Diagnosis not present

## 2015-10-07 ENCOUNTER — Other Ambulatory Visit: Payer: Self-pay | Admitting: Obstetrics and Gynecology

## 2015-10-07 DIAGNOSIS — Z8543 Personal history of malignant neoplasm of ovary: Secondary | ICD-10-CM | POA: Diagnosis not present

## 2015-10-07 DIAGNOSIS — Z1272 Encounter for screening for malignant neoplasm of vagina: Secondary | ICD-10-CM | POA: Diagnosis not present

## 2015-10-07 LAB — HM PAP SMEAR: HM Pap smear: NEGATIVE

## 2015-10-08 LAB — CYTOLOGY - PAP

## 2015-10-16 ENCOUNTER — Encounter: Payer: Self-pay | Admitting: Internal Medicine

## 2015-10-16 ENCOUNTER — Encounter: Payer: Self-pay | Admitting: *Deleted

## 2015-10-22 ENCOUNTER — Other Ambulatory Visit: Payer: Self-pay | Admitting: Internal Medicine

## 2015-10-22 ENCOUNTER — Encounter: Payer: Self-pay | Admitting: Internal Medicine

## 2015-10-22 ENCOUNTER — Ambulatory Visit (INDEPENDENT_AMBULATORY_CARE_PROVIDER_SITE_OTHER): Payer: Medicare Other | Admitting: Internal Medicine

## 2015-10-22 VITALS — BP 120/68 | HR 65 | Temp 98.0°F | Ht 67.25 in | Wt 161.0 lb

## 2015-10-22 DIAGNOSIS — Z0000 Encounter for general adult medical examination without abnormal findings: Secondary | ICD-10-CM

## 2015-10-22 DIAGNOSIS — Z853 Personal history of malignant neoplasm of breast: Secondary | ICD-10-CM | POA: Diagnosis not present

## 2015-10-22 DIAGNOSIS — E039 Hypothyroidism, unspecified: Secondary | ICD-10-CM | POA: Diagnosis not present

## 2015-10-22 DIAGNOSIS — G629 Polyneuropathy, unspecified: Secondary | ICD-10-CM

## 2015-10-22 DIAGNOSIS — Z8543 Personal history of malignant neoplasm of ovary: Secondary | ICD-10-CM | POA: Diagnosis not present

## 2015-10-22 NOTE — Progress Notes (Signed)
Subjective:    Patient ID: [name], female    DOB: 1940/06/26, 75 y.o.   MRN: [id number]  HPI  CC: cpx - doing well.   History of Present Illness:   75  year-old patient who is seen today for a wellness exam.  She is followed closely by oncology and GYN  due to a history of prior breast and ovarian cancer. She is doing quite well. She has had a recent  Oncology and GYN evaluation with lab. Her only complaint today is forgetfulness.  MMSE 28/30-unable to recall only one of 3 objects  Past Medical History  Diagnosis Date  . Ovarian cancer [facility])     March 1998  . DCIS (ductal carcinoma in situ) of breast   . Unspecified vitamin D deficiency   . Hypothyroidism      Social History   Social History  . Marital Status: Divorced    Spouse Name: N/A  . Number of Children: N/A  . Years of Education: N/A   Occupational History  . Not on file.   Social History Main Topics  . Smoking status: Former Smoker    Quit date: 05/26/1991  . Smokeless tobacco: Never Used  . Alcohol Use: Yes     Comment: social  . Drug Use: No  . Sexual Activity: Not on file   Other Topics Concern  . Not on file   Social History Narrative   March 1998- history of stage III ovarian cancer; status post suboptimal debulking; status post 6 cycles of Taxoil and carboplatin with complete remission documented by second look surgery September 1998.      Recurrent ovarian cancer, status post 6 cycles carbo and Taxol, April 1998 until July 2008      1992, left breast intraductal carcinoma (DCIS)   Status post left modified radial mastectomy and with silicon implant   13 of 13 lymph modes negative; positive for BCRA1 gene   Recurrent ovarian carinoma, February 2008   Admit Feb. 2009 partial small bowel obstruction treated medically    Past Surgical History  Procedure Laterality Date  . Biopsy thyroid      for benign disease  . Colonscopy  2004    2/08  . Left ankle fracture      status post  surgery, March 2010  . Cholecystectomy    . Breast surgery    . Colon surgery      colectomy for cancer  . Total abdominal hysterectomy w/ bilateral salpingoophorectomy  1998    TAH for ovarian cancer  . Mastectomy  1992    left mastectomy  . Port-a-cath removal Right 04/15/2015    Procedure: REMOVAL PORT-A-CATH;  Surgeon: [name], MD;  Location: [facility];  Service: General;  Laterality: Right;  Right upper chest    Family History  Problem Relation Age of Onset  . Cancer Mother     [name], historyof melanoma   . Aneurysm Father     abdominal aortic  . Heart disease Father   . Diverticulitis Brother     Allergies  Allergen Reactions  . Codeine Sulfate Nausea And Vomiting    REACTION: unspecified  . Promethazine Hcl     REACTION: unspecified    Current Outpatient Prescriptions on File Prior to Visit  Medication Sig Dispense Refill  . estradiol (ESTRACE) 1 MG tablet 1 tablet daily 90 tablet 3  . levothyroxine (SYNTHROID, LEVOTHROID) 88 MCG tablet TAKE 1 TABLET EVERY DAY 90 tablet 3  .  multivitamin (THERAGRAN) per tablet Take 1 tablet by mouth daily.       No current facility-administered medications on file prior to visit.    BP 120/68 mmHg  Pulse 65  Temp(Src) 98 F (36.7 C) (Oral)  Ht 5' 7.25" (1.708 m)  Wt 161 lb (73.029 kg)  BMI 25.03 kg/m2  SpO2 97%    Here for Medicare AWV:   1. Risk factors based on Past M, S, F history: include a family history of cardiovascular disease, only  2. Physical Activities: remains quite active participates at a local health club  3. Depression/mood: no history of depression or mood disorder  4. Hearing: no deficits  5. ADL's: independent in all aspects of daily living  6. Fall Risk: low  7. Home Safety: no cause identified  8. Height, weight, &visual acuity:height and weight are stable.  Planning cataract extraction surgery later this summer 9. Counseling: regular exercise, heart healthy diet.  Encouraged  10. Labs ordered based on risk factors: laboratory profile included lipid panel reviewed  11. Referral Coordination- follow-up oncology, and GYN  12. Care Plan- follow-up oncology and gynecology  13. Cognitive Assessment- alert and oriented, with normal affect.  Complains of mild forgetfulness. No problems with executive functioning, and handles all personal affairs   MMSE 28/30 14.  Preventive services will include annual exams with screening lab.  The patient will be consider for one final colonoscopy next year Provider list includes primary care GYN oncology ophthalmology as well as radiology  Preventive Screening-Counseling & Management  Alcohol-Tobacco  Smoking Status: never   Allergies:  1) ! Codeine Sulfate (Codeine Sulfate)  2) ! Promethazine Hcl (Promethazine Hcl)   Past History:  Past Medical History:  Reviewed history from 06/06/2009 and no changes required.  March/1998: history of stage IIIc ovarian cancer; status post suboptimal debulking; status post 6 cycles of Taxol and carboplatin with complete remission documented by second look surgery September 1998  recurrent ovarian cancer, status post 6 cycles carbo and Taxol, April 2008 until July 2008  1992, left breast intraductal carcinoma (DCIS)  status post left modified radical mastectomy and with silicon implant  13 of 13 lymph nodes negative; positive for BCRA1 gene.  recurrent ovarian carcinoma, February 2008  admit February 2009 partial small bowel obstruction treated medically   Past Surgical History:  Thyroid biopsy for benign disease  colonoscopy 2004; 2/08  left ankle fracture, status post surgery, March 2010  Cholecystectomy   Family History:  Reviewed history from 03/23/2008 and no changes required.  father died age 26, following gallbladder surgery; history of abdominal aortic aneurysm and cerebrovascular disease  Mother died of ovarian cancer, history of melanoma  Two brothers; one brother,  status post CABG (possible stenting only), complex diverticular disease   Social History:  Reviewed history from 03/23/2008 and no changes required.  Married  Smoking Status: never     Review of Systems  Constitutional: Negative for fever, appetite change, fatigue and unexpected weight change.  HENT: Negative for congestion, dental problem, ear pain, hearing loss, mouth sores, nosebleeds, sinus pressure, sore throat, tinnitus, trouble swallowing and voice change.   Eyes: Negative for photophobia, pain, redness and visual disturbance.  Respiratory: Negative for cough, chest tightness and shortness of breath.   Cardiovascular: Negative for chest pain, palpitations and leg swelling.  Gastrointestinal: Negative for nausea, vomiting, abdominal pain, diarrhea, constipation, blood in stool, abdominal distention and rectal pain.  Genitourinary: Negative for dysuria, urgency, frequency, hematuria, flank pain, vaginal bleeding, vaginal discharge,  difficulty urinating, genital sores, vaginal pain, menstrual problem and pelvic pain.  Musculoskeletal: Negative for back pain, arthralgias and neck stiffness.  Skin: Negative for rash.  Neurological: Negative for dizziness, syncope, speech difficulty, weakness, light-headedness, numbness and headaches.  Hematological: Negative for adenopathy. Does not bruise/bleed easily.  Psychiatric/Behavioral: Negative for suicidal ideas, behavioral problems, self-injury, dysphoric mood and agitation. The patient is not nervous/anxious.        Objective:   Physical Exam  Constitutional: She is oriented to person, place, and time. She appears well-developed and well-nourished.  HENT:  Head: Normocephalic and atraumatic.  Right Ear: External ear normal.  Left Ear: External ear normal.  Mouth/Throat: Oropharynx is clear and moist.  Eyes: Conjunctivae and EOM are normal.  Neck: Normal range of motion. Neck supple. No JVD present. No thyromegaly present.   Cardiovascular: Normal rate, regular rhythm, normal heart sounds and intact distal pulses.   No murmur heard. Pulmonary/Chest: Effort normal and breath sounds normal. She has no wheezes. She has no rales.  Abdominal: Soft. Bowel sounds are normal. She exhibits no distension and no mass. There is no tenderness. There is no rebound and no guarding.  Musculoskeletal: Normal range of motion. She exhibits no edema or tenderness.  Neurological: She is alert and oriented to person, place, and time. She has normal reflexes. No cranial nerve deficit. She exhibits normal muscle tone. Coordination normal.  Skin: Skin is warm and dry. No rash noted.  Psychiatric: She has a normal mood and affect. Her behavior is normal.          Assessment & Plan:    Preventive health exam Hypothyroidism.  No change in supplement History of ovarian and breast cancer Forgetfulness  MMSE 28/30) .  Will repeat in 6-12 months  Followup oncology and gynecology. Recheck in one year Medications refilled    Nyoka Cowden, MD

## 2015-10-22 NOTE — Progress Notes (Signed)
Pre visit review using our clinic review tool, if applicable. No additional management support is needed unless otherwise documented below in the visit note. 

## 2015-10-22 NOTE — Patient Instructions (Signed)
Limit your sodium (Salt) intake    It is important that you exercise regularly, at least 20 minutes 3 to 4 times per week.  If you develop chest pain or shortness of breath seek  medical attention.  Take a calcium supplement, plus 800-1200 units of vitamin D  Return in one year for follow-up  

## 2015-10-25 ENCOUNTER — Other Ambulatory Visit: Payer: Self-pay

## 2015-10-25 DIAGNOSIS — Z853 Personal history of malignant neoplasm of breast: Secondary | ICD-10-CM

## 2015-10-25 DIAGNOSIS — Z8543 Personal history of malignant neoplasm of ovary: Secondary | ICD-10-CM

## 2015-10-27 ENCOUNTER — Other Ambulatory Visit: Payer: Self-pay | Admitting: Oncology

## 2015-10-28 ENCOUNTER — Telehealth: Payer: Self-pay | Admitting: Oncology

## 2015-10-28 ENCOUNTER — Encounter: Payer: Self-pay | Admitting: Oncology

## 2015-10-28 ENCOUNTER — Other Ambulatory Visit (HOSPITAL_BASED_OUTPATIENT_CLINIC_OR_DEPARTMENT_OTHER): Payer: Medicare Other

## 2015-10-28 ENCOUNTER — Other Ambulatory Visit: Payer: Self-pay | Admitting: Oncology

## 2015-10-28 ENCOUNTER — Ambulatory Visit (HOSPITAL_BASED_OUTPATIENT_CLINIC_OR_DEPARTMENT_OTHER): Payer: Medicare Other | Admitting: Oncology

## 2015-10-28 VITALS — BP 143/60 | HR 72 | Temp 97.9°F | Resp 18 | Ht 67.0 in | Wt 160.3 lb

## 2015-10-28 DIAGNOSIS — Z853 Personal history of malignant neoplasm of breast: Secondary | ICD-10-CM

## 2015-10-28 DIAGNOSIS — Z1501 Genetic susceptibility to malignant neoplasm of breast: Secondary | ICD-10-CM

## 2015-10-28 DIAGNOSIS — Z8543 Personal history of malignant neoplasm of ovary: Secondary | ICD-10-CM | POA: Diagnosis not present

## 2015-10-28 DIAGNOSIS — Z1239 Encounter for other screening for malignant neoplasm of breast: Secondary | ICD-10-CM

## 2015-10-28 DIAGNOSIS — Z1509 Genetic susceptibility to other malignant neoplasm: Principal | ICD-10-CM

## 2015-10-28 DIAGNOSIS — C569 Malignant neoplasm of unspecified ovary: Secondary | ICD-10-CM

## 2015-10-28 DIAGNOSIS — D0512 Intraductal carcinoma in situ of left breast: Secondary | ICD-10-CM

## 2015-10-28 DIAGNOSIS — Z1231 Encounter for screening mammogram for malignant neoplasm of breast: Secondary | ICD-10-CM

## 2015-10-28 LAB — COMPREHENSIVE METABOLIC PANEL
ALT: 18 U/L (ref 0–55)
ANION GAP: 10 meq/L (ref 3–11)
AST: 15 U/L (ref 5–34)
Albumin: 3.7 g/dL (ref 3.5–5.0)
Alkaline Phosphatase: 78 U/L (ref 40–150)
BUN: 13.1 mg/dL (ref 7.0–26.0)
CALCIUM: 9 mg/dL (ref 8.4–10.4)
CHLORIDE: 108 meq/L (ref 98–109)
CO2: 21 meq/L — AB (ref 22–29)
Creatinine: 0.9 mg/dL (ref 0.6–1.1)
EGFR: 59 mL/min/{1.73_m2} — AB (ref >90)
Glucose: 159 mg/dl — ABNORMAL HIGH (ref 70–140)
POTASSIUM: 3.7 meq/L (ref 3.5–5.1)
Sodium: 139 mEq/L (ref 136–145)
Total Bilirubin: 0.53 mg/dL (ref 0.20–1.20)
Total Protein: 7 g/dL (ref 6.4–8.3)

## 2015-10-28 LAB — CBC WITH DIFFERENTIAL/PLATELET
BASO%: 0.4 % (ref 0.0–2.0)
BASOS ABS: 0 10*3/uL (ref 0.0–0.1)
EOS%: 3.1 % (ref 0.0–7.0)
Eosinophils Absolute: 0.2 10*3/uL (ref 0.0–0.5)
HEMATOCRIT: 46.6 % (ref 34.8–46.6)
HGB: 15.5 g/dL (ref 11.6–15.9)
LYMPH#: 1.2 10*3/uL (ref 0.9–3.3)
LYMPH%: 25.1 % (ref 14.0–49.7)
MCH: 31.7 pg (ref 25.1–34.0)
MCHC: 33.3 g/dL (ref 31.5–36.0)
MCV: 95.3 fL (ref 79.5–101.0)
MONO#: 0.3 10*3/uL (ref 0.1–0.9)
MONO%: 6.3 % (ref 0.0–14.0)
NEUT#: 3.1 10*3/uL (ref 1.5–6.5)
NEUT%: 65.1 % (ref 38.4–76.8)
PLATELETS: 186 10*3/uL (ref 145–400)
RBC: 4.89 10*6/uL (ref 3.70–5.45)
RDW: 12.6 % (ref 11.2–14.5)
WBC: 4.8 10*3/uL (ref 3.9–10.3)

## 2015-10-28 NOTE — Telephone Encounter (Signed)
appt made and avs printed °

## 2015-10-28 NOTE — Progress Notes (Signed)
OFFICE PROGRESS NOTE   October 29, 2015   Physicians:David Bridgett Larsson Murinson), D.ClarkePearson, (C.Streck), P.Kwaitkowski, [place], W.Whitworth (derm), (S.Bowie), R.Wainer, Vanessa Kick, T.Rosenbower.  INTERVAL HISTORY:   Patient is seen, alone for visit, in scheduled follow up of history of ovarian carcinoma and DCIS left breast, with known BRCA 1 abnormality.  She has been on observation since treated for recurrent ovarian carcinoma in 2008; she has not seen Dr Josephina Shih since prior to 2012 per this EMR. She is now established with Dr Vanessa Kick, seen in 08-2015, to have yearly follow up. treated for recurrence of ovarian carcinoma in 2008, no known active disease since then.   She had right tomo mammogram at [facility] 03-12-15 with no findings of concern, next due 02-2016 (left mastectomy with reconstruction, right subpectoral implant).  Last colonoscopy by Dr Amedeo Plenty ~ 2016 per patient, next in 5 years.  Patient reports that she has felt very well since she was here last, with good energy, no recent infectious illness, no pain, no noted changes in right breast, no abdominal or pelvic discomfort, no bleeding, no bladder changes, no SOB or cough, no LE swelling. She has had loose bowel movements at times and has recently stopped all of her vitamin supplements as she feels those may be the issue. At recommendation of Dr Nonie Hoyer she has decreased her estrogen in half, which she is tolerating without difficulty,  tho she is not yet willing to discontinue this. She had no problem with PAC removal. She tells me now that she is able to lie on abdomen, just not able to lie directly on the breast implants, so likely can tolerate positioning for breast MRI. Patient concerned about forgetfulness, MMSE by PCP last month. Remainder of 10 point Review of Systems negative/ unchanged.    PAC removed 03-2015 Refused flu vaccine  BRCA 1 positive  ONCOLOGIC HISTORY  Patient had intraductal  carcinoma of left breast in Sept 1992, with left modified radical mastectomy and 13 node axillary dissection (all negative) by Dr Marylene Buerger, and reconstruction. She was intolerant of tamoxifen. She has a subpectoral implant on right.  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 She had recurrence in March 2008, with surgery then by Dr Margot Chimes for 4 cm mass along colon and left pelvic node. (That pathology was ER PR negative.) Patient recalls that only symptom of recurrence was burning discomfort in LLQ. She had additional 6 cycles of taxol carboplatin from April - July 2008 and has had no known active disease since then. Per patient, ca 2729 marker was elevated with the recurrent ovarian cancer, with normal ca125; that information not available in this EMR.   Objective:  Vital signs in last 24 hours:  BP 143/60 mmHg  Pulse 72  Temp(Src) 97.9 F (36.6 C)  Resp 18  Ht '5\' 7"'  (1.702 m)  Wt 160 lb 4.8 oz (72.712 kg)  BMI 25.10 kg/m2  SpO2 97% Weight up 1 lb. Alert, oriented and appropriate. Ambulatory without difficulty.  No alopecia  HEENT:PERRL, sclerae not icteric. Oral mucosa moist without lesions, posterior pharynx clear.  Neck supple. No JVD.  Lymphatics:no cervical,supraclavicular, axillary or inguinal adenopathy Resp: clear to auscultation bilaterally and normal percussion bilaterally Cardio: regular rate and rhythm. No gallop. GI: soft, nontender, not distended, no mass or organomegaly. Normally active bowel sounds. Surgical  incision not remarkable. Musculoskeletal/ Extremities: without pitting edema, cords, tenderness. Lymphedema LUE not obvious on visualization now Neuro: Speech fluent, no obvious recall problems. Otherwise nonfocal. PSYCH appropriate mood and affect Skin without rash,  ecchymosis, petechiae Breasts: Left reconstructed breast without evidence of local recurrence. Right breast (with subpectoral implant) without dominant mass, skin or nipple findings. Axillae benign. Scar from previous PAC well healed,  Not tender  Lab Results:  Results for orders placed or performed in visit on 10/28/15  CBC with Differential  Result Value Ref Range   WBC 4.8 3.9 - 10.3 10e3/uL   NEUT# 3.1 1.5 - 6.5 10e3/uL   HGB 15.5 11.6 - 15.9 g/dL   HCT 46.6 34.8 - 46.6 %   Platelets 186 145 - 400 10e3/uL   MCV 95.3 79.5 - 101.0 fL   MCH 31.7 25.1 - 34.0 pg   MCHC 33.3 31.5 - 36.0 g/dL   RBC 4.89 3.70 - 5.45 10e6/uL   RDW 12.6 11.2 - 14.5 %   lymph# 1.2 0.9 - 3.3 10e3/uL   MONO# 0.3 0.1 - 0.9 10e3/uL   Eosinophils Absolute 0.2 0.0 - 0.5 10e3/uL   Basophils Absolute 0.0 0.0 - 0.1 10e3/uL   NEUT% 65.1 38.4 - 76.8 %   LYMPH% 25.1 14.0 - 49.7 %   MONO% 6.3 0.0 - 14.0 %   EOS% 3.1 0.0 - 7.0 %   BASO% 0.4 0.0 - 2.0 %  Comprehensive metabolic panel  Result Value Ref Range   Sodium 139 136 - 145 mEq/L   Potassium 3.7 3.5 - 5.1 mEq/L   Chloride 108 98 - 109 mEq/L   CO2 21 (L) 22 - 29 mEq/L   Glucose 159 (H) 70 - 140 mg/dl   BUN 13.1 7.0 - 26.0 mg/dL   Creatinine 0.9 0.6 - 1.1 mg/dL   Total Bilirubin 0.53 0.20 - 1.20 mg/dL   Alkaline Phosphatase 78 40 - 150 U/L   AST 15 5 - 34 U/L   ALT 18 0 - 55 U/L   Total Protein 7.0 6.4 - 8.3 g/dL   Albumin 3.7 3.5 - 5.0 g/dL   Calcium 9.0 8.4 - 10.4 mg/dL   Anion Gap 10 3 - 11 mEq/L   EGFR 59 (L) >90 ml/min/1.73 m2  CA 125  Result Value Ref Range   Cancer Antigen (CA) 125 9.2 0.0 - 38.1 U/mL  CA 27.29  Result Value Ref Range   CA 27.29 27.7 0.0 - 38.6 U/mL  CA 27-29 (Parallel Testing)  Result Value Ref Range   CA 27.29 31 <38 U/mL  CA 125 (Parallel Testing)  Result Value Ref Range   CA 125 8 <35 U/mL     Studies/Results:  No results found.  Medications: I have reviewed the patient's current medications. I believe that  she is now using 1/2 of 1 mg estrace daily.  DISCUSSION Reviewed concerns for long term estrogen, which she has used x years for hot flashes (begun prior to cancer and BRCA 1 diagnoses), encouraged her to work towards discontinuing.  Discussed recommendation for breast MRI as yearly screening in addition to mammograms for BRCA +. She feels that she would be able to tolerate positioning for breast MRI and agrees with setting this up within 3 months after next mammogram (mammogram due Oct 2017, so would need breast MRI by Jan 2018).   Assessment/Plan:  1. Ovarian carcinoma: initially diagnosed 1 and recurrent 2008, treatment as above, clinically doing well. We will let her know marker  results as above.  2.intraductal carcinoma left breast 1992, post mastectomy and 13 node axillary dissection. Continue yearly tomo right mammogram due in Oct. With BRCA 1, breast MRI yearly also appropriate and patient feels that she can tolerate positionng for this. Radiologists prefer MRI within 3 months after mammograms. 3.BRCA 1 abnormality: continue follow up at this office.  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 Stable and not bothersome. 9. At least 20 years on supplemental estrogen: I have again recommended that she stop this medication, particularly given known risk factors for breast and gyn cancers.  10.left breast reconstruction and right subpectoral implant 11.up to date on colonoscopy by Dr Amedeo Plenty  All questions answered and she knows to call if needed prior to next appointment ~ Oct. May be able to alternate visits every 6 months with this office and  gyn from there. Orders placed for right mammogram and breast MRI as above. Time spent 25 min including >50% counseling and  coordination of care. Route Dr Nonie Hoyer, cc Dr Harrington Challenger, Dr Trinna Post, MD   10/29/2015, 10:54 PM

## 2015-10-28 NOTE — Telephone Encounter (Signed)
error 

## 2015-10-29 ENCOUNTER — Telehealth: Payer: Self-pay | Admitting: *Deleted

## 2015-10-29 ENCOUNTER — Telehealth: Payer: Self-pay

## 2015-10-29 ENCOUNTER — Other Ambulatory Visit: Payer: Self-pay | Admitting: Oncology

## 2015-10-29 DIAGNOSIS — Z1509 Genetic susceptibility to other malignant neoplasm: Principal | ICD-10-CM

## 2015-10-29 DIAGNOSIS — Z86000 Personal history of in-situ neoplasm of breast: Secondary | ICD-10-CM | POA: Insufficient documentation

## 2015-10-29 DIAGNOSIS — Z8543 Personal history of malignant neoplasm of ovary: Secondary | ICD-10-CM | POA: Insufficient documentation

## 2015-10-29 DIAGNOSIS — H25811 Combined forms of age-related cataract, right eye: Secondary | ICD-10-CM | POA: Diagnosis not present

## 2015-10-29 DIAGNOSIS — Z1501 Genetic susceptibility to malignant neoplasm of breast: Secondary | ICD-10-CM

## 2015-10-29 DIAGNOSIS — H2511 Age-related nuclear cataract, right eye: Secondary | ICD-10-CM | POA: Diagnosis not present

## 2015-10-29 DIAGNOSIS — H25011 Cortical age-related cataract, right eye: Secondary | ICD-10-CM | POA: Diagnosis not present

## 2015-10-29 DIAGNOSIS — Z1239 Encounter for other screening for malignant neoplasm of breast: Secondary | ICD-10-CM | POA: Insufficient documentation

## 2015-10-29 HISTORY — DX: Personal history of in-situ neoplasm of breast: Z86.000

## 2015-10-29 LAB — CANCER ANTIGEN 27-29 (PARALLEL TESTING): CA 27.29: 31 U/mL (ref <38)

## 2015-10-29 LAB — CANCER ANTIGEN 125 (PARALLEL TESTING): CA 125: 8 U/mL (ref <35)

## 2015-10-29 LAB — CA 125: CANCER ANTIGEN (CA) 125: 9.2 U/mL (ref 0.0–38.1)

## 2015-10-29 LAB — CANCER ANTIGEN 27.29: CAN 27.29: 27.7 U/mL (ref 0.0–38.6)

## 2015-10-29 NOTE — Telephone Encounter (Signed)
Told [name] the results of the markers as noted below by Dr. Marko Plume.

## 2015-10-29 NOTE — Telephone Encounter (Signed)
"  Juliann Pulse with [facility] calling for imaging order in [facility] for MRI of breast Bilateral with contrast.  This order needs to be changed to MRI breast Bilateral with and without contrast.  Cannot schedule until corret order entered."  Will notify provider of this request.

## 2015-10-29 NOTE — Telephone Encounter (Signed)
-----   Message from Gordy Levan, MD sent at 10/29/2015 12:12 PM EDT ----- Labs seen and need follow up:  Please let her know markers both in normal range

## 2015-10-30 ENCOUNTER — Other Ambulatory Visit: Payer: Self-pay | Admitting: Internal Medicine

## 2015-11-12 DIAGNOSIS — H2512 Age-related nuclear cataract, left eye: Secondary | ICD-10-CM | POA: Diagnosis not present

## 2015-11-12 DIAGNOSIS — H25812 Combined forms of age-related cataract, left eye: Secondary | ICD-10-CM | POA: Diagnosis not present

## 2015-11-12 DIAGNOSIS — H25012 Cortical age-related cataract, left eye: Secondary | ICD-10-CM | POA: Diagnosis not present

## 2015-12-05 DIAGNOSIS — L814 Other melanin hyperpigmentation: Secondary | ICD-10-CM | POA: Diagnosis not present

## 2015-12-05 DIAGNOSIS — L910 Hypertrophic scar: Secondary | ICD-10-CM | POA: Diagnosis not present

## 2015-12-05 DIAGNOSIS — Z85828 Personal history of other malignant neoplasm of skin: Secondary | ICD-10-CM | POA: Diagnosis not present

## 2015-12-05 DIAGNOSIS — D485 Neoplasm of uncertain behavior of skin: Secondary | ICD-10-CM | POA: Diagnosis not present

## 2015-12-05 DIAGNOSIS — L821 Other seborrheic keratosis: Secondary | ICD-10-CM | POA: Diagnosis not present

## 2015-12-05 DIAGNOSIS — L570 Actinic keratosis: Secondary | ICD-10-CM | POA: Diagnosis not present

## 2016-03-12 ENCOUNTER — Other Ambulatory Visit: Payer: Self-pay | Admitting: Oncology

## 2016-03-12 ENCOUNTER — Ambulatory Visit
Admission: RE | Admit: 2016-03-12 | Discharge: 2016-03-12 | Disposition: A | Discharge status: Home / Self Care | Payer: Medicare Other | Source: Ambulatory Visit | Attending: Oncology | Admitting: Oncology

## 2016-03-12 DIAGNOSIS — Z1231 Encounter for screening mammogram for malignant neoplasm of breast: Secondary | ICD-10-CM

## 2016-03-22 ENCOUNTER — Other Ambulatory Visit: Payer: Self-pay | Admitting: Oncology

## 2016-03-22 DIAGNOSIS — Z1509 Genetic susceptibility to other malignant neoplasm: Principal | ICD-10-CM

## 2016-03-22 DIAGNOSIS — Z1501 Genetic susceptibility to malignant neoplasm of breast: Principal | ICD-10-CM

## 2016-03-22 DIAGNOSIS — C569 Malignant neoplasm of unspecified ovary: Secondary | ICD-10-CM

## 2016-03-22 DIAGNOSIS — D0512 Intraductal carcinoma in situ of left breast: Secondary | ICD-10-CM

## 2016-03-23 ENCOUNTER — Encounter: Payer: Self-pay | Admitting: Oncology

## 2016-03-23 ENCOUNTER — Other Ambulatory Visit (HOSPITAL_BASED_OUTPATIENT_CLINIC_OR_DEPARTMENT_OTHER): Payer: Medicare Other

## 2016-03-23 ENCOUNTER — Other Ambulatory Visit: Payer: Self-pay | Admitting: Oncology

## 2016-03-23 ENCOUNTER — Ambulatory Visit (HOSPITAL_BASED_OUTPATIENT_CLINIC_OR_DEPARTMENT_OTHER): Payer: Medicare Other | Admitting: Oncology

## 2016-03-23 VITALS — BP 106/47 | HR 60 | Temp 97.8°F | Resp 17 | Ht 67.0 in | Wt 161.9 lb

## 2016-03-23 DIAGNOSIS — Z853 Personal history of malignant neoplasm of breast: Secondary | ICD-10-CM | POA: Diagnosis not present

## 2016-03-23 DIAGNOSIS — C569 Malignant neoplasm of unspecified ovary: Secondary | ICD-10-CM | POA: Diagnosis not present

## 2016-03-23 DIAGNOSIS — Z1501 Genetic susceptibility to malignant neoplasm of breast: Secondary | ICD-10-CM | POA: Diagnosis not present

## 2016-03-23 DIAGNOSIS — Z1509 Genetic susceptibility to other malignant neoplasm: Principal | ICD-10-CM

## 2016-03-23 DIAGNOSIS — D0512 Intraductal carcinoma in situ of left breast: Secondary | ICD-10-CM

## 2016-03-23 DIAGNOSIS — Z8543 Personal history of malignant neoplasm of ovary: Secondary | ICD-10-CM

## 2016-03-23 DIAGNOSIS — I890 Lymphedema, not elsewhere classified: Secondary | ICD-10-CM

## 2016-03-23 DIAGNOSIS — G620 Drug-induced polyneuropathy: Secondary | ICD-10-CM | POA: Diagnosis not present

## 2016-03-23 LAB — COMPREHENSIVE METABOLIC PANEL
ALBUMIN: 3.6 g/dL (ref 3.5–5.0)
ALT: 18 U/L (ref 0–55)
ANION GAP: 9 meq/L (ref 3–11)
AST: 13 U/L (ref 5–34)
Alkaline Phosphatase: 101 U/L (ref 40–150)
BILIRUBIN TOTAL: 0.41 mg/dL (ref 0.20–1.20)
BUN: 13.9 mg/dL (ref 7.0–26.0)
CALCIUM: 9 mg/dL (ref 8.4–10.4)
CO2: 22 mEq/L (ref 22–29)
CREATININE: 0.8 mg/dL (ref 0.6–1.1)
Chloride: 109 mEq/L (ref 98–109)
EGFR: 67 mL/min/{1.73_m2} — ABNORMAL LOW (ref >90)
Glucose: 93 mg/dl (ref 70–140)
Potassium: 4 mEq/L (ref 3.5–5.1)
Sodium: 140 mEq/L (ref 136–145)
TOTAL PROTEIN: 7.1 g/dL (ref 6.4–8.3)

## 2016-03-23 LAB — CBC WITH DIFFERENTIAL/PLATELET
BASO%: 0.7 % (ref 0.0–2.0)
Basophils Absolute: 0 10*3/uL (ref 0.0–0.1)
EOS%: 2.5 % (ref 0.0–7.0)
Eosinophils Absolute: 0.2 10*3/uL (ref 0.0–0.5)
HEMATOCRIT: 47.1 % — AB (ref 34.8–46.6)
HGB: 15.6 g/dL (ref 11.6–15.9)
LYMPH#: 1.3 10*3/uL (ref 0.9–3.3)
LYMPH%: 22 % (ref 14.0–49.7)
MCH: 31.2 pg (ref 25.1–34.0)
MCHC: 33.2 g/dL (ref 31.5–36.0)
MCV: 94 fL (ref 79.5–101.0)
MONO#: 0.6 10*3/uL (ref 0.1–0.9)
MONO%: 9.5 % (ref 0.0–14.0)
NEUT%: 65.3 % (ref 38.4–76.8)
NEUTROS ABS: 3.9 10*3/uL (ref 1.5–6.5)
PLATELETS: 213 10*3/uL (ref 145–400)
RBC: 5.02 10*6/uL (ref 3.70–5.45)
RDW: 12.8 % (ref 11.2–14.5)
WBC: 6 10*3/uL (ref 3.9–10.3)

## 2016-03-23 NOTE — Progress Notes (Signed)
OFFICE PROGRESS NOTE   March 23, 2016   Physicians:(David Bridgett Larsson Murinson), D.ClarkePearson, (C.Streck), P.Kwaitkowski, J.Hayes, W.Whitworth (derm), (S.Bowie), R.Wainer, Vanessa Kick, T.Rosenbower.  INTERVAL HISTORY:  Patient is seen, alone for visit, in follow up of BRCA 1 mutation, with history of ovarian carcinoma and DCIS left breast.  She had mammograms at [facility] 03-13-16, no mammographic findings of concern (left mastectomy with reconstruction, right subpectoral implant). Plan is for breast MRI within 3 months after mammograms, based on BRCA 1 mutation.  She has been on observation since treated for recurrent ovarian carcinoma in 2008; she has not seen Dr Josephina Shih since prior to 2012. She saw Dr Vanessa Kick [phone], yearly follow up planned.   Patient reports that she has been well since she was here last in June, with no concerns that seem referable to breast or ovarian cancer history. Energy is good, tho she has not exercised regularly since beginning of summer, plans to resume. Appetite good. No SOB, GI symptoms, pain, bleeding, recent infectious illness. No noted changes in breasts. No abdominal bloating. No bladder symptoms. She has decreased Estrace to 1/2 of 1 mg tablet daily, does not plan to try to decrease this further. Remainder of 10 point Review of Systems negative.  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 She was intolerant of tamoxifen. She has a subpectoral implant on right.   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 She had recurrence in March 2008, with  surgery then by Dr Margot Chimes for 4 cm mass along colon and left pelvic node. (That pathology was ER PR negative.) Patient recalls that only symptom of recurrence was burning discomfort in LLQ. She had additional 6 cycles of taxol carboplatin from April - July 2008 and has had no known active disease since then. Per patient, ca 2729 marker was elevated with the recurrent ovarian cancer, with normal ca125; that information not available in this EMR.    Objective:  Vital signs in last 24 hours:  BP (!) 106/47 (BP Location: Right Arm, Patient Position: Sitting)   Pulse 60   Temp 97.8 F (36.6 C) (Oral)   Resp 17   Ht 5' 7" (1.702 m)   Wt 161 lb 14.4 oz (73.4 kg)   SpO2 98%   BMI 25.36 kg/m  Weight up 2 lbs. Alert, oriented and appropriate, looks comfortable. Ambulatory without difficulty, easily able to get on and off exam table. Respirations not labored.     HEENT:PERRL, sclerae not icteric. Oral mucosa moist without lesions, posterior pharynx clear.  Neck supple. No JVD.  Lymphatics:no cervical,supraclavicular, axillary or inguinal adenopathy Resp: clear to auscultation bilaterally and normal percussion bilaterally Cardio: regular rate and rhythm. No gallop. GI: soft, nontender, not distended, no mass or organomegaly. Normally active bowel sounds.  Musculoskeletal/ Extremities: UE / LE without edema, cords, tenderness Neuro:  nonfocal   PSYCH appropriate mood and affect Skin without rash, ecchymosis, petechiae Breasts: Left reconstructed breast without evidence of recurrence, right breast with subpectoral implant without dominant mass, skin or nipple findings. Axillae benign.   Lab Results:  Results for orders placed or performed in visit on 03/23/16  CBC with Differential  Result Value Ref Range   WBC 6.0 3.9 - 10.3 10e3/uL   NEUT# 3.9 1.5 - 6.5 10e3/uL   HGB 15.6 11.6 - 15.9 g/dL   HCT 47.1 (H) 34.8 - 46.6 %   Platelets 213 145 - 400 10e3/uL   MCV 94.0 79.5 - 101.0 fL   MCH  31.2 25.1 - 34.0 pg   MCHC 33.2 31.5 - 36.0 g/dL   RBC 5.02 3.70 - 5.45 10e6/uL   RDW 12.8 11.2 - 14.5 %   lymph# 1.3 0.9 - 3.3 10e3/uL   MONO# 0.6 0.1 - 0.9 10e3/uL   Eosinophils Absolute 0.2 0.0 - 0.5 10e3/uL   Basophils Absolute 0.0 0.0 - 0.1 10e3/uL   NEUT% 65.3 38.4 - 76.8 %   LYMPH% 22.0 14.0 - 49.7 %   MONO% 9.5 0.0 - 14.0 %   EOS% 2.5 0.0 - 7.0 %   BASO% 0.7 0.0 - 2.0 %  Comprehensive metabolic panel  Result Value Ref Range   Sodium 140 136 - 145 mEq/L   Potassium 4.0 3.5 - 5.1 mEq/L   Chloride 109 98 - 109 mEq/L   CO2 22 22 - 29 mEq/L   Glucose 93 70 - 140 mg/dl   BUN 13.9 7.0 - 26.0 mg/dL   Creatinine 0.8 0.6 - 1.1 mg/dL   Total Bilirubin 0.41 0.20 - 1.20 mg/dL   Alkaline Phosphatase 101 40 - 150 U/L   AST 13 5 - 34 U/L   ALT 18 0 - 55 U/L   Total Protein 7.1 6.4 - 8.3 g/dL   Albumin 3.6 3.5 - 5.0 g/dL   Calcium 9.0 8.4 - 10.4 mg/dL   Anion Gap 9 3 - 11 mEq/L   EGFR 67 (L) >90 ml/min/1.73 m2    CA 125 available after visit 9.2 by new lab method and 7 by previous "parallel" testing.  Studies/Results:  [facility] 2D DIGITAL SCREENING UNILATERAL RIGHT MAMMOGRAM WITH IMPLANTS, CAD AND ADJUNCT TOMO<Procedure Description>2D DIGITAL SCREENING UNILATERAL RIGHT MAMMOGRAM WITH IMPLANTS, CAD AND ADJUNCT TOMO  The patient has a retropectoral implant. Standard and implant displaced views were performed.  COMPARISON:  Previous exam(s).  ACR Breast Density Category b: There are scattered areas of fibroglandular density.  FINDINGS: The patient has had a left mastectomy. There are no findings suspicious for malignancy.   IMPRESSION: No mammographic evidence of malignancy. A result letter of this screening mammogram will be mailed directly to the patient.  RECOMMENDATION: Screening mammogram in one year.  (Code:SM-R-47M)  BI-RADS CATEGORY  1: Negative   Medications: I have reviewed the patient's current medications. Discussed Estrace, which  she has taken for years for hot flashes, initially begun prior to breast cancer diagnosis and BRCA information. She has no hot flashes now on the lower dose, but refuses any consideration of stopping this drug (prescription is not from this office).  DISCUSSION  Reviewed indication for breast MRI. Patient is in agreement with having this done before end of year, order placed.   Medication as above  Offered patient follow up with [facility], however she would prefer at least knowing a medical oncologist. As I will not be at [facility] after first of year, I have asked for an appointment with Dr Burr Medico in early summer 2018.  Patient will see Dr Vanessa Kick ~ [phone]. Patient may consider Survivorship Clinic after establishing with Dr Burr Medico.  Assessment/Plan: 1. Ovarian carcinoma: initially diagnosed 96 and recurrent 2008, treatment as above, clinically doing well.  She will see Dr Harrington Challenger ~ 08-2016, patient to request that appointment. 2.intraductal carcinoma left breast 1992, post mastectomy and 13 node axillary dissection. Continue yearly tomo right mammogram due in Oct. With BRCA 1, breast MRI yearly also appropriate and patient feels that she can tolerate positionng for this. Breast MRI requested in 04-2016.  3.BRCA 1 abnormality. 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 Stable and not bothersome. 9.  20+ years on supplemental estrogen: I recommended stopping this medication, particularly given known risk factors for breast and gyn cancers. She decreased dose but prefers to continue. Not prescribed thru this office.  10.left breast reconstruction and right subpectoral implant 11.up to date on colonoscopy by Dr Amedeo Plenty   All questions answered and patient knows to call if needed. Time spent 20  min including >50% counseling and coordination of care. Route PCP, cc Dr Raliegh Ip. Pierce Crane, MD   03/23/2016, 4:02 PM

## 2016-03-24 LAB — CANCER ANTIGEN 125 (PARALLEL TESTING): CA 125: 7 U/mL (ref <35)

## 2016-03-24 LAB — CA 125: Cancer Antigen (CA) 125: 9.2 U/mL (ref 0.0–38.1)

## 2016-04-06 ENCOUNTER — Ambulatory Visit
Admission: RE | Admit: 2016-04-06 | Discharge: 2016-04-06 | Disposition: A | Discharge status: Home / Self Care | Payer: Medicare Other | Source: Ambulatory Visit | Attending: Oncology | Admitting: Oncology

## 2016-04-06 DIAGNOSIS — N6489 Other specified disorders of breast: Secondary | ICD-10-CM | POA: Diagnosis not present

## 2016-04-06 DIAGNOSIS — Z1501 Genetic susceptibility to malignant neoplasm of breast: Secondary | ICD-10-CM

## 2016-04-06 DIAGNOSIS — Z1509 Genetic susceptibility to other malignant neoplasm: Principal | ICD-10-CM

## 2016-04-06 MED ORDER — GADOBENATE DIMEGLUMINE 529 MG/ML IV SOLN
15.0000 mL | Freq: Once | INTRAVENOUS | Status: AC | PRN
  Administered 2016-04-06: 15 mL via INTRAVENOUS

## 2016-04-07 ENCOUNTER — Telehealth: Payer: Self-pay

## 2016-04-07 NOTE — Telephone Encounter (Signed)
-----   Message from Gordy Levan, MD sent at 04/07/2016  1:49 PM EST ----- Labs seen and need follow up: please let her know breast MRI shows nothing of concern for breast cancer. The implants do not show any leaks.   thanks

## 2016-04-08 NOTE — Telephone Encounter (Signed)
Told [name] the results of the breast MRI as noted below by Dr. Marko Plume.

## 2016-06-08 DIAGNOSIS — L821 Other seborrheic keratosis: Secondary | ICD-10-CM | POA: Diagnosis not present

## 2016-06-08 DIAGNOSIS — L820 Inflamed seborrheic keratosis: Secondary | ICD-10-CM | POA: Diagnosis not present

## 2016-06-08 DIAGNOSIS — L570 Actinic keratosis: Secondary | ICD-10-CM | POA: Diagnosis not present

## 2016-06-08 DIAGNOSIS — D1801 Hemangioma of skin and subcutaneous tissue: Secondary | ICD-10-CM | POA: Diagnosis not present

## 2016-06-08 DIAGNOSIS — L814 Other melanin hyperpigmentation: Secondary | ICD-10-CM | POA: Diagnosis not present

## 2016-06-08 DIAGNOSIS — D2262 Melanocytic nevi of left upper limb, including shoulder: Secondary | ICD-10-CM | POA: Diagnosis not present

## 2016-06-16 ENCOUNTER — Telehealth: Payer: Self-pay | Admitting: Internal Medicine

## 2016-06-16 MED ORDER — LEVOTHYROXINE SODIUM 88 MCG PO TABS: 88.0000 ug | ORAL_TABLET | Freq: Every day | ORAL | 3 refills | Status: DC

## 2016-06-16 NOTE — Telephone Encounter (Signed)
Pt is still using [facility].  Phone # is [phone]

## 2016-06-16 NOTE — Telephone Encounter (Signed)
Pt is needing a refill on levothyroxine (SYNTHROID, LEVOTHROID) 88 MCG tablet

## 2016-06-16 NOTE — Addendum Note (Signed)
Addended by: Abelardo Diesel on: 06/16/2016 04:04 PM   Modules accepted: Orders

## 2016-06-24 DIAGNOSIS — H10413 Chronic giant papillary conjunctivitis, bilateral: Secondary | ICD-10-CM | POA: Diagnosis not present

## 2016-08-14 ENCOUNTER — Other Ambulatory Visit: Payer: Self-pay | Admitting: Internal Medicine

## 2016-10-12 DIAGNOSIS — Z853 Personal history of malignant neoplasm of breast: Secondary | ICD-10-CM | POA: Diagnosis not present

## 2016-10-12 DIAGNOSIS — N951 Menopausal and female climacteric states: Secondary | ICD-10-CM | POA: Diagnosis not present

## 2016-10-12 DIAGNOSIS — Z8543 Personal history of malignant neoplasm of ovary: Secondary | ICD-10-CM | POA: Diagnosis not present

## 2016-11-05 ENCOUNTER — Telehealth: Payer: Self-pay | Admitting: Hematology

## 2016-11-05 NOTE — Telephone Encounter (Signed)
Lvm advising appt moved from 6/28 to 7/26 @ 12.45 due to md pal.

## 2016-11-19 ENCOUNTER — Ambulatory Visit: Payer: Medicare Other | Admitting: Hematology

## 2016-11-19 ENCOUNTER — Other Ambulatory Visit: Payer: Medicare Other

## 2016-12-14 ENCOUNTER — Other Ambulatory Visit: Payer: Self-pay

## 2016-12-17 ENCOUNTER — Other Ambulatory Visit (HOSPITAL_BASED_OUTPATIENT_CLINIC_OR_DEPARTMENT_OTHER): Payer: Medicare Other

## 2016-12-17 ENCOUNTER — Encounter: Payer: Self-pay | Admitting: Hematology

## 2016-12-17 ENCOUNTER — Telehealth: Payer: Self-pay | Admitting: Hematology

## 2016-12-17 ENCOUNTER — Ambulatory Visit (HOSPITAL_BASED_OUTPATIENT_CLINIC_OR_DEPARTMENT_OTHER): Payer: Medicare Other | Admitting: Hematology

## 2016-12-17 VITALS — BP 117/73 | HR 71 | Temp 97.4°F | Resp 18 | Ht 67.0 in | Wt 155.9 lb

## 2016-12-17 DIAGNOSIS — Z86000 Personal history of in-situ neoplasm of breast: Secondary | ICD-10-CM

## 2016-12-17 DIAGNOSIS — Z1501 Genetic susceptibility to malignant neoplasm of breast: Secondary | ICD-10-CM | POA: Diagnosis not present

## 2016-12-17 DIAGNOSIS — Z8543 Personal history of malignant neoplasm of ovary: Secondary | ICD-10-CM

## 2016-12-17 DIAGNOSIS — Z1509 Genetic susceptibility to other malignant neoplasm: Secondary | ICD-10-CM

## 2016-12-17 DIAGNOSIS — Z1239 Encounter for other screening for malignant neoplasm of breast: Secondary | ICD-10-CM

## 2016-12-17 LAB — CBC WITH DIFFERENTIAL/PLATELET
BASO%: 0.7 % (ref 0.0–2.0)
Basophils Absolute: 0 10e3/uL (ref 0.0–0.1)
EOS%: 2.3 % (ref 0.0–7.0)
Eosinophils Absolute: 0.1 10e3/uL (ref 0.0–0.5)
HCT: 49.1 % — ABNORMAL HIGH (ref 34.8–46.6)
HGB: 16.4 g/dL — ABNORMAL HIGH (ref 11.6–15.9)
LYMPH%: 20.8 % (ref 14.0–49.7)
MCH: 32 pg (ref 25.1–34.0)
MCHC: 33.4 g/dL (ref 31.5–36.0)
MCV: 95.7 fL (ref 79.5–101.0)
MONO#: 0.5 10e3/uL (ref 0.1–0.9)
MONO%: 8.2 % (ref 0.0–14.0)
NEUT#: 3.8 10e3/uL (ref 1.5–6.5)
NEUT%: 68 % (ref 38.4–76.8)
Platelets: 203 10e3/uL (ref 145–400)
RBC: 5.13 10e6/uL (ref 3.70–5.45)
RDW: 12.5 % (ref 11.2–14.5)
WBC: 5.6 10e3/uL (ref 3.9–10.3)
lymph#: 1.2 10e3/uL (ref 0.9–3.3)

## 2016-12-17 LAB — COMPREHENSIVE METABOLIC PANEL WITH GFR
ALT: 14 U/L (ref 0–55)
AST: 10 U/L (ref 5–34)
Albumin: 4 g/dL (ref 3.5–5.0)
Alkaline Phosphatase: 95 U/L (ref 40–150)
Anion Gap: 9 meq/L (ref 3–11)
BUN: 15 mg/dL (ref 7.0–26.0)
CO2: 27 meq/L (ref 22–29)
Calcium: 9.8 mg/dL (ref 8.4–10.4)
Chloride: 104 meq/L (ref 98–109)
Creatinine: 1.1 mg/dL (ref 0.6–1.1)
EGFR: 50 ml/min/1.73 m2 — ABNORMAL LOW (ref >90)
Glucose: 101 mg/dL (ref 70–140)
Potassium: 4.2 meq/L (ref 3.5–5.1)
Sodium: 140 meq/L (ref 136–145)
Total Bilirubin: 0.43 mg/dL (ref 0.20–1.20)
Total Protein: 7.5 g/dL (ref 6.4–8.3)

## 2016-12-17 NOTE — Progress Notes (Signed)
[place]  [phone] [phone]) [phone]  Clinic Follow up Note   Patient Care Team: Marletta Lor, MD as PCP - General 12/17/2016  SUMMARY OF ONCOLOGIC HISTORY: 1. BRCA 1 + 2. Left breast cancer in 1992 3. 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 She was intolerant of tamoxifen. She has a subpectoral implant on right.   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 She had recurrence in March 2008, with surgery then by Dr Margot Chimes for 4 cm mass along colon and left pelvic node. (That pathology was ER PR negative.) Patient recalls that only symptom of recurrence was burning discomfort in LLQ. She had additional 6 cycles of taxol carboplatin from April - July 2008 and has had no known active disease since then. Per patient, ca 2729 marker was elevated with the recurrent ovarian cancer, with normal ca125; that information not available in this EMR.   CURRENT THERAPY: Surveillance    INTERVAL HISTORY:  [name] is here for a follow-up. She was previously under Dr. Mariana Kaufman care, who has recently retired. She presents to the clinic today by herself. She reports nothing new since her last follow up. She feels well with no complaints.   In the past her breast cancer came first. She had a mastectomy instead of the planned lumpectomy. She did try Tamoxifen and she stopped early. 6 years later she was diagnosed with ovarian cancer. She is BRCA 1 positive and did not elect for oophorectomy. Then years later she had ovarian cancer again.   She has two kids  and neither do not wish to be tested for BRCA 1 mutation. Their father had cancer as well and died from it 2.5 years ago.   Her grandson recently got married.     REVIEW OF SYSTEMS:   Constitutional: Denies fevers, chills or abnormal weight loss Eyes: Denies blurriness of vision Ears, nose, mouth, throat, and face: Denies mucositis or sore throat Respiratory: Denies cough, dyspnea or wheezes Cardiovascular: Denies palpitation, chest discomfort or lower extremity swelling Gastrointestinal:  Denies nausea, heartburn or change in bowel habits Skin: Denies abnormal skin rashes Lymphatics: Denies new lymphadenopathy or easy bruising Neurological:Denies numbness, tingling or new weaknesses Behavioral/Psych: Mood is stable, no new changes  All other systems were reviewed with the patient and are negative.  MEDICAL HISTORY:  Past Medical History:  Diagnosis Date  . DCIS (ductal carcinoma in situ) of breast   . Hypothyroidism   . Ovarian cancer [facility])    March 1998  . Unspecified vitamin D deficiency     SURGICAL HISTORY: Past Surgical History:  Procedure Laterality Date  . BIOPSY THYROID     for benign disease  . BREAST SURGERY    . CHOLECYSTECTOMY    . COLON SURGERY     colectomy for cancer  . colonscopy  2004   2/08  . left ankle fracture     status post surgery, March 2010  . MASTECTOMY  1992   left mastectomy  . PORT-A-CATH REMOVAL Right 04/15/2015   Procedure: REMOVAL PORT-A-CATH;  Surgeon: [name], MD;  Location: [place];  Service: General;  Laterality: Right;  Right upper chest  . TOTAL ABDOMINAL HYSTERECTOMY W/ BILATERAL SALPINGOOPHORECTOMY  1998   TAH for ovarian cancer    I have reviewed the social history and family history with the patient and they are unchanged from previous note.  ALLERGIES:  is allergic to codeine sulfate and promethazine hcl.  MEDICATIONS:  Current Outpatient Prescriptions  Medication Sig Dispense Refill  .  estradiol (ESTRACE) 1 MG tablet TAKE 1 TABLET EVERY DAY (Patient taking differently: TAKE HALF TABLET DAILY) 90 tablet 3  . levothyroxine (SYNTHROID, LEVOTHROID) 88 MCG tablet Take 1 tablet (88 mcg total) by mouth daily. 90 tablet 3  . multivitamin (THERAGRAN) per tablet Take 1 tablet by mouth daily.       No current facility-administered medications for this visit.     PHYSICAL EXAMINATION: ECOG PERFORMANCE STATUS: 0 - Asymptomatic  Vitals:   12/17/16 1324  BP: 117/73  Pulse: 71  Resp: 18  Temp: (!) 97.4 F (36.3 C)   Filed Weights   12/17/16 1324  Weight: 155 lb 14.4 oz (70.7 kg)    GENERAL:alert, no distress and comfortable SKIN: skin color, texture, turgor are normal, no rashes or significant lesions EYES: normal, Conjunctiva are pink and non-injected, sclera clear OROPHARYNX:no exudate, no erythema and lips, buccal mucosa, and tongue normal  NECK: supple, thyroid normal size, non-tender, without nodularity LYMPH:  no palpable lymphadenopathy in the cervical, axillary or inguinal LUNGS: clear to auscultation and percussion with normal breathing effort HEART: regular rate & rhythm and no murmurs and no lower extremity edema ABDOMEN:abdomen soft, non-tender and normal bowel sounds Musculoskeletal:no cyanosis of digits and no clubbing  NEURO: alert & oriented x 3 with fluent speech, no focal motor/sensory deficits Breast: (+) Surgical incision in right breast and left breast incision with implant. Both show no palpable mass  LABORATORY DATA:  I have reviewed the data as listed CBC Latest Ref Rng & Units 12/17/2016 03/23/2016 10/28/2015  WBC 3.9 - 10.3 10e3/uL 5.6 6.0 4.8  Hemoglobin 11.6 - 15.9 g/dL 16.4(H) 15.6 15.5  Hematocrit 34.8 - 46.6 % 49.1(H) 47.1(H) 46.6  Platelets 145 - 400 10e3/uL 203 213 186     CMP Latest Ref Rng & Units 12/17/2016 03/23/2016 10/28/2015  Glucose 70 - 140 mg/dl 101 93 159(H)  BUN 7.0 - 26.0 mg/dL 15.0 13.9 13.1  Creatinine 0.6 - 1.1 mg/dL 1.1  0.8 0.9  Sodium 136 - 145 mEq/L 140 140 139  Potassium 3.5 - 5.1 mEq/L 4.2 4.0 3.7  Chloride 98 - 107 mEq/L - - -  CO2 22 - 29 mEq/L 27 22 21(L)  Calcium 8.4 - 10.4 mg/dL 9.8 9.0 9.0  Total Protein 6.4 - 8.3 g/dL 7.5 7.1 7.0  Total Bilirubin 0.20 - 1.20 mg/dL 0.43 0.41 0.53  Alkaline Phos 40 - 150 U/L 95 101 78  AST 5 - 34 U/L '10 13 15  ' ALT 0 - 55 U/L '14 18 18   ' Results for VAUNDA, GUTTERMAN (MRN [id number]) as of 12/18/2016 21:25  Ref. Range 10/28/2015 14:31 03/23/2016 14:49 12/17/2016 12:55  CA 27.29 Latest Ref Range: <38 U/mL 31    CA 27.29 Latest Ref Range: 0.0 - 38.6 U/mL 27.7    CA 125 Latest Ref Range: <35 U/mL 8 7   Cancer Antigen (CA) 125 Latest Ref Range: 0.0 - 38.1 U/mL 9.2 9.2 9.4    RADIOGRAPHIC STUDIES: I have personally reviewed the radiological images as listed and agreed with the findings in the report.  No results found.    MRI Breast Bilateral 04/06/16 IMPRESSION: No evidence of malignancy. RECOMMENDATION: Bilateral screening mammogram in 11 months when due.  Screening mammogram W/Implant 03/02/16 IMPRESSION: No mammographic evidence of malignancy. A result letter of this screening mammogram will be mailed directly to the patient. RECOMMENDATION: Screening mammogram in one year   ASSESSMENT & PLAN:  [name] has a past medical history of hypothyroidism and vitamin D deficiency.   1. History of Ovarian Carcinoma, recurrent, NED now  -I have reviewed her history extensively, and confirmed the ley findings with patient.  -she initially diagnosed with stage IIIC in 1998 and pelvic recurrent 2008, s/u surgery and chemo  -She is on surveillance with yearly pelvic exams from her OB now. Last surveillance PET scan in 2013 showed no evidence of disease. -She has been clinically doing very well, no complaints. Her CA 125 has been normal, no clinically concerning for recurrence. -Labs reviewed, her CBC and CMP are unremarkable except mild elevated  hemoglobin. I suggest she drinks more water. We will monitor in the future.  -f/u with her in a year    2. History of Left Breast DCIS -post left mastectomy and 13 node axillary dissection -left breast reconstruction and right subpectoral implant -She is on breast surveillance with yearly mammograms, and breast exams with yearly follow ups.  -I suggest she separates her mammogram and MRI with 6 months apart. Due to her BRCA1 mutation, yearly screening MRI is recommended.   -Her next mammogram will be 02/2017 and MRI 08/2017   3. Genetics -Pt is BRCA 1 mutation positive -Due to her advanced age, right prophylactic mastectomy was not recommended. -I strongly encourage her to get her children to get tested for BRCA 1 mutation and if needed her grandchildren. I discussed the importance and what can be done for them.  They need regular mammograms and exams with their gynecologist.    4. 20+ years on supplemental estrogen, estradiol -both Dr. Curlene Dolphin and I recommended stopping this medication, particularly given known risk factors for breast and gyn cancers. She decreased dose to half tablet but prefers to continue.    PLAN:  -Follow up In 1 year with lab -Mammogram in 02/2017 at [facility] -Breast MRI in 08/2017 at [facility]   Orders Placed This Encounter  Procedures  . MM Digital Screening W/ Implants    Standing Status:   Future    Standing Expiration Date:   12/17/2017    Scheduling Instructions:     Right breast only, left had mastectomy    Order Specific Question:   Reason for Exam (SYMPTOM  OR DIAGNOSIS REQUIRED)    Answer:   screening    Order Specific Question:   Preferred imaging location?    Answer:   [facility]  . MR Breast Right W Wo Contrast    Standing Status:   Future    Standing Expiration Date:   02/17/2018    Order Specific Question:   If indicated for the ordered procedure, I authorize the administration of contrast media per Radiology protocol    Answer:   Yes    Order  Specific Question:   Reason for Exam (SYMPTOM  OR DIAGNOSIS REQUIRED)    Answer:   screening    Order Specific Question:   What is the patient's sedation requirement?    Answer:   No Sedation    Order Specific Question:   Does the patient have a pacemaker or implanted devices?  Answer:   No    Order Specific Question:   Preferred imaging location?    Answer:   [facility] (table limit-550lbs)    Order Specific Question:   Radiology Contrast Protocol - do NOT remove file path    Answer:   [email or web address]  . CBC with Differential    Standing Status:   Standing    Number of Occurrences:   30    Standing Expiration Date:   12/17/2021  . Comprehensive metabolic panel    Standing Status:   Standing    Number of Occurrences:   30    Standing Expiration Date:   12/17/2021  . CA 125    Standing Status:   Standing    Number of Occurrences:   30    Standing Expiration Date:   12/17/2021   All questions were answered. The patient knows to call the clinic with any problems, questions or concerns. No barriers to learning was detected. I spent 30 minutes counseling the patient face to face. The total time spent in the appointment was 40 minutes and more than 50% was on counseling and review of test results  This document serves as a record of services personally performed by Truitt Merle, MD. It was created on her behalf by Joslyn Devon, a trained medical scribe. The creation of this record is based on the scribe's personal observations and the provider's statements to them. This document has been checked and approved by the attending provider.     Truitt Merle, MD 12/17/2016

## 2016-12-17 NOTE — Telephone Encounter (Signed)
Gave patient avs report and appointments for January 2019. Patient will arrange annual mammo with [facility] and was given information to contact [place] for mri expected April 2019.

## 2016-12-18 ENCOUNTER — Encounter: Payer: Self-pay | Admitting: Hematology

## 2016-12-18 ENCOUNTER — Other Ambulatory Visit: Payer: Self-pay | Admitting: Hematology

## 2016-12-18 ENCOUNTER — Telehealth: Payer: Self-pay | Admitting: *Deleted

## 2016-12-18 DIAGNOSIS — Z1509 Genetic susceptibility to other malignant neoplasm: Secondary | ICD-10-CM

## 2016-12-18 DIAGNOSIS — Z86000 Personal history of in-situ neoplasm of breast: Secondary | ICD-10-CM

## 2016-12-18 DIAGNOSIS — Z1501 Genetic susceptibility to malignant neoplasm of breast: Secondary | ICD-10-CM

## 2016-12-18 LAB — CA 125: CANCER ANTIGEN (CA) 125: 9.4 U/mL (ref 0.0–38.1)

## 2016-12-18 NOTE — Telephone Encounter (Signed)
Received call from [facility] stating order for MRI R breast needs to be changed to MRI breast w & w/o contrast .  Message to DR Burr Medico.

## 2016-12-25 DIAGNOSIS — D225 Melanocytic nevi of trunk: Secondary | ICD-10-CM | POA: Diagnosis not present

## 2016-12-25 DIAGNOSIS — D1801 Hemangioma of skin and subcutaneous tissue: Secondary | ICD-10-CM | POA: Diagnosis not present

## 2016-12-25 DIAGNOSIS — L570 Actinic keratosis: Secondary | ICD-10-CM | POA: Diagnosis not present

## 2016-12-25 DIAGNOSIS — C44622 Squamous cell carcinoma of skin of right upper limb, including shoulder: Secondary | ICD-10-CM | POA: Diagnosis not present

## 2016-12-25 DIAGNOSIS — D2261 Melanocytic nevi of right upper limb, including shoulder: Secondary | ICD-10-CM | POA: Diagnosis not present

## 2016-12-25 DIAGNOSIS — Z85828 Personal history of other malignant neoplasm of skin: Secondary | ICD-10-CM | POA: Diagnosis not present

## 2016-12-25 DIAGNOSIS — L814 Other melanin hyperpigmentation: Secondary | ICD-10-CM | POA: Diagnosis not present

## 2016-12-25 DIAGNOSIS — L821 Other seborrheic keratosis: Secondary | ICD-10-CM | POA: Diagnosis not present

## 2017-01-05 ENCOUNTER — Ambulatory Visit (INDEPENDENT_AMBULATORY_CARE_PROVIDER_SITE_OTHER): Payer: Medicare Other | Admitting: Internal Medicine

## 2017-01-05 ENCOUNTER — Encounter: Payer: Self-pay | Admitting: Internal Medicine

## 2017-01-05 VITALS — BP 132/82 | HR 73 | Temp 98.0°F | Ht 67.0 in | Wt 156.6 lb

## 2017-01-05 DIAGNOSIS — E039 Hypothyroidism, unspecified: Secondary | ICD-10-CM | POA: Diagnosis not present

## 2017-01-05 DIAGNOSIS — R413 Other amnesia: Secondary | ICD-10-CM

## 2017-01-05 MED ORDER — DONEPEZIL HCL 5 MG PO TABS: 5.0000 mg | ORAL_TABLET | Freq: Every day | ORAL | 0 refills | Status: DC

## 2017-01-05 MED ORDER — DONEPEZIL HCL 10 MG PO TABS: 10.0000 mg | ORAL_TABLET | Freq: Every day | ORAL | 3 refills | Status: DC

## 2017-01-05 NOTE — Patient Instructions (Signed)
It is important that you exercise regularly, at least 20 minutes 3 to 4 times per week.  If you develop chest pain or shortness of breath seek  medical attention.  Stay actively mentally with activities such as reading, playing cards, crossword puzzles other social activities and interactions

## 2017-01-05 NOTE — Progress Notes (Signed)
Subjective:    Patient ID: [name], female    DOB: 04-29-1941, 76 y.o.   MRN: [id number]  HPI  76 year old patient who is seen today for evaluation of memory concerns.  She states that she has become much more forgetful. She states that she had an abrupt onset of forgetfulness about one year ago that she attributes to anesthesia associated with eye surgery. She was last seen here on 10/22/2015 for an annual exam.  At that time she complained of forgetfulness and MMSE was performed with a score of 28/30.  She was able to recall only one of 3 objects She had a head CT performed in 2014 due to head trauma that revealed significant cerebral atrophy  MMSE today 27/30- unable to recall any of 3 objects  She states that she often goes into a room, such as her kitchen and does not remember why she entered the room.  She is very active with reading.  Has dropped off her exercise regimen during the  summer.  However  Past Medical History:  Diagnosis Date  . DCIS (ductal carcinoma in situ) of breast   . Hypothyroidism   . Ovarian cancer [name])    March 1998  . Unspecified vitamin D deficiency      Social History   Social History  . Marital status: Divorced    Spouse name: N/A  . Number of children: N/A  . Years of education: N/A   Occupational History  . Not on file.   Social History Main Topics  . Smoking status: Former Smoker    Quit date: 05/26/1991  . Smokeless tobacco: Never Used  . Alcohol use Yes     Comment: social  . Drug use: No  . Sexual activity: Not on file   Other Topics Concern  . Not on file   Social History Narrative   March 1998- history of stage III ovarian cancer; status post suboptimal debulking; status post 6 cycles of [name] and [name] with complete remission documented by second look surgery September 1998.      Recurrent ovarian cancer, status post 6 cycles carbo and Taxol, April 1998 until July 2008      1992, left breast intraductal  carcinoma (DCIS)   Status post left modified radial mastectomy and with silicon implant   13 of 13 lymph modes negative; positive for BCRA1 gene   Recurrent ovarian carinoma, February 2008   Admit Feb. 2009 partial small bowel obstruction treated medically    Past Surgical History:  Procedure Laterality Date  . BIOPSY THYROID     for benign disease  . BREAST SURGERY    . CHOLECYSTECTOMY    . COLON SURGERY     colectomy for cancer  . colonscopy  2004   2/08  . left ankle fracture     status post surgery, March 2010  . MASTECTOMY  1992   left mastectomy  . PORT-A-CATH REMOVAL Right 04/15/2015   Procedure: REMOVAL PORT-A-CATH;  Surgeon: Jessica Confer, MD;  Location: [facility];  Service: General;  Laterality: Right;  Right upper chest  . TOTAL ABDOMINAL HYSTERECTOMY W/ BILATERAL SALPINGOOPHORECTOMY  1998   TAH for ovarian cancer    Family History  Problem Relation Age of Onset  . Cancer Mother        [name], historyof melanoma   . Aneurysm Father        abdominal aortic  . Heart disease Father   . Diverticulitis Brother  Allergies  Allergen Reactions  . Codeine Sulfate Nausea And Vomiting    REACTION: unspecified  . Promethazine Hcl     REACTION: unspecified    Current Outpatient Prescriptions on File Prior to Visit  Medication Sig Dispense Refill  . estradiol (ESTRACE) 1 MG tablet TAKE 1 TABLET EVERY DAY (Patient taking differently: TAKE HALF TABLET DAILY) 90 tablet 3  . levothyroxine (SYNTHROID, LEVOTHROID) 88 MCG tablet Take 1 tablet (88 mcg total) by mouth daily. 90 tablet 3  . multivitamin (THERAGRAN) per tablet Take 1 tablet by mouth daily.       No current facility-administered medications on file prior to visit.     BP 132/82 (BP Location: Left Arm, Patient Position: Sitting, Cuff Size: Normal)   Pulse 73   Temp 98 F (36.7 C) (Oral)   Ht 5\' 7"  (1.702 m)   Wt 156 lb 9.6 oz (71 kg)   SpO2 97%   BMI 24.53 kg/m     Review of  Systems  Constitutional: Negative.   HENT: Negative for congestion, dental problem, hearing loss, rhinorrhea, sinus pressure, sore throat and tinnitus.   Eyes: Negative for pain, discharge and visual disturbance.  Respiratory: Negative for cough and shortness of breath.   Cardiovascular: Negative for chest pain, palpitations and leg swelling.  Gastrointestinal: Negative for abdominal distention, abdominal pain, blood in stool, constipation, diarrhea, nausea and vomiting.  Genitourinary: Negative for difficulty urinating, dysuria, flank pain, frequency, hematuria, pelvic pain, urgency, vaginal bleeding, vaginal discharge and vaginal pain.  Musculoskeletal: Negative for arthralgias, gait problem and joint swelling.  Skin: Negative for rash.  Neurological: Negative for dizziness, syncope, speech difficulty, weakness, numbness and headaches.  Hematological: Negative for adenopathy.  Psychiatric/Behavioral: Positive for confusion and decreased concentration. Negative for agitation, behavioral problems and dysphoric mood. The patient is not nervous/anxious.        Objective:   Physical Exam  Constitutional: She appears well-developed and well-nourished. No distress.  Neurological: She is alert. No cranial nerve deficit. Coordination normal.  MMSE 27/30 .  Able to recall 0 out of 3 objects  Psychiatric: She has a normal mood and affect. Her behavior is normal. Judgment and thought content normal.          Assessment & Plan:   Memory impairment.  Options discussed including treatment or neurology referral.  We'll start on Aricept 5.  For 30 days, then increase to 10 mg daily  .  Schedule CPX History of breast cancer History of ovarian cancer Menopausal syndrome  Cerebral atrophy  [name]

## 2017-02-11 ENCOUNTER — Encounter: Payer: Self-pay | Admitting: Internal Medicine

## 2017-03-15 ENCOUNTER — Ambulatory Visit
Admission: RE | Admit: 2017-03-15 | Discharge: 2017-03-15 | Disposition: A | Discharge status: Home / Self Care | Payer: Medicare Other | Source: Ambulatory Visit | Attending: Hematology | Admitting: Hematology

## 2017-03-15 DIAGNOSIS — Z1509 Genetic susceptibility to other malignant neoplasm: Principal | ICD-10-CM

## 2017-03-15 DIAGNOSIS — Z1231 Encounter for screening mammogram for malignant neoplasm of breast: Secondary | ICD-10-CM | POA: Diagnosis not present

## 2017-03-15 DIAGNOSIS — Z1239 Encounter for other screening for malignant neoplasm of breast: Secondary | ICD-10-CM

## 2017-03-15 DIAGNOSIS — Z1501 Genetic susceptibility to malignant neoplasm of breast: Secondary | ICD-10-CM

## 2017-06-17 ENCOUNTER — Ambulatory Visit: Payer: Medicare Other | Admitting: Hematology

## 2017-06-17 ENCOUNTER — Other Ambulatory Visit: Payer: Medicare Other

## 2017-06-23 NOTE — Progress Notes (Signed)
[facility]  [phone] Fax:([phone]  Clinic Follow up Note   Patient Care Team: Marletta Lor, MD as PCP - General   Date of Service:  06/24/2017  SUMMARY OF ONCOLOGIC HISTORY: 1. BRCA 1 + 2. Left breast cancer in 1992 3. 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 She was intolerant of tamoxifen. She has a subpectoral implant on right.   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 She had recurrence in March 2008, with surgery then by Dr Margot Chimes for 4 cm mass along colon and left pelvic node. (That pathology was ER PR negative.) Patient recalls that only symptom of recurrence was burning discomfort in LLQ. She had additional 6 cycles of taxol carboplatin from April - July 2008 and has had no known active disease since then. Per patient, ca 2729 marker was elevated with the recurrent ovarian cancer, with normal ca125; that information not available in this EMR.   CURRENT THERAPY: Surveillance    INTERVAL HISTORY:  [name] is here for a follow-up. She was last seen by me 6 months ago. She presents to the clinic today reporting she has been doing well. She plans to see her dermatologist next month for a regular follow up after her skin cancer was removed in 12/2016.  She notes to being active but not as much as usual recently. She notes she had her breast implants were change once to saline. She does regular self breast exams. She notes her daughters are in their 78s and have not been tested for BRCA1.   On review of symptoms, pt she denies  pain, chest discomfort, cough, abdominal bloating and has normal BMs with no bloody stool. She has lost purposeful weight by changing her diet. She denies any residual problems from prior treatment. She notes she has mild memory issues. She still functions well.    REVIEW OF SYSTEMS:   Constitutional: Denies fevers, chills or abnormal weight loss Eyes: Denies blurriness of vision Ears, nose, mouth, throat, and face: Denies mucositis or sore throat Respiratory: Denies cough, dyspnea or wheezes Cardiovascular: Denies palpitation, chest discomfort or lower extremity swelling Gastrointestinal:  Denies nausea, heartburn or change in bowel habits Skin: Denies abnormal skin rashes Lymphatics: Denies new lymphadenopathy or easy bruising Neurological:Denies numbness, tingling or new weaknesses Behavioral/Psych: Mood is stable, no new changes  All other systems were reviewed with the patient and are negative.  MEDICAL HISTORY:  Past Medical History:  Diagnosis Date  . DCIS (ductal carcinoma in situ) of breast   . Hypothyroidism   . Ovarian cancer [facility])    March 1998  . Unspecified vitamin D deficiency     SURGICAL HISTORY: Past Surgical History:  Procedure Laterality Date  . BIOPSY THYROID     for benign disease  . BREAST SURGERY    . CHOLECYSTECTOMY    . COLON SURGERY     colectomy for cancer  . colonscopy  2004   2/08  . left ankle fracture     status post surgery, March 2010  . MASTECTOMY  1992   left mastectomy  . PORT-A-CATH REMOVAL Right  04/15/2015   Procedure: REMOVAL PORT-A-CATH;  Surgeon: [name], MD;  Location: [place];  Service: General;  Laterality: Right;  Right upper chest  . TOTAL ABDOMINAL HYSTERECTOMY W/ BILATERAL SALPINGOOPHORECTOMY  1998   TAH for ovarian cancer    I have reviewed the social history and family history with the patient and they are unchanged from previous note.  ALLERGIES:  is allergic to codeine sulfate and  promethazine hcl.  MEDICATIONS:  Current Outpatient Medications  Medication Sig Dispense Refill  . donepezil (ARICEPT) 10 MG tablet Take 1 tablet (10 mg total) by mouth at bedtime. 90 tablet 3  . estradiol (ESTRACE) 1 MG tablet TAKE 1 TABLET EVERY DAY (Patient taking differently: TAKE HALF TABLET DAILY) 90 tablet 3  . levothyroxine (SYNTHROID, LEVOTHROID) 88 MCG tablet Take 1 tablet (88 mcg total) by mouth daily. 90 tablet 3  . multivitamin (THERAGRAN) per tablet Take 1 tablet by mouth daily.       No current facility-administered medications for this visit.     PHYSICAL EXAMINATION: ECOG PERFORMANCE STATUS: 0 - Asymptomatic  Vitals:   06/24/17 1458  BP: (!) 139/56  Pulse: 63  Resp: 16  Temp: 97.6 F (36.4 C)  SpO2: 97%   Filed Weights   06/24/17 1458  Weight: 147 lb 8 oz (66.9 kg)    GENERAL:alert, no distress and comfortable SKIN: skin color, texture, turgor are normal, no rashes or significant lesions EYES: normal, Conjunctiva are pink and non-injected, sclera clear OROPHARYNX:no exudate, no erythema and lips, buccal mucosa, and tongue normal  NECK: supple, thyroid normal size, non-tender, without nodularity LYMPH:  no palpable lymphadenopathy in the cervical, axillary or inguinal LUNGS: clear to auscultation and percussion with normal breathing effort HEART: regular rate & rhythm and no murmurs and no lower extremity edema ABDOMEN:abdomen soft, non-tender and normal bowel sounds Musculoskeletal:no cyanosis of digits and no clubbing  NEURO: alert & oriented x 3 with fluent speech, no focal motor/sensory deficits Breast: (+) Surgical incision in right breast and left breast incision with implant. Both show no palpable mass  LABORATORY DATA:  I have reviewed the data as listed CBC Latest Ref Rng & Units 06/24/2017 12/17/2016 03/23/2016  WBC 3.9 - 10.3 K/uL 6.8 5.6 6.0  Hemoglobin 11.6 - 15.9 g/dL 15.3 16.4(H) 15.6  Hematocrit 34.8 - 46.6 % 45.7 49.1(H) 47.1(H)    Platelets 145 - 400 K/uL 221 203 213     CMP Latest Ref Rng & Units 06/24/2017 12/17/2016 03/23/2016  Glucose 70 - 140 mg/dL 85 101 93  BUN 7 - 26 mg/dL 10 15.0 13.9  Creatinine 0.60 - 1.10 mg/dL 0.88 1.1 0.8  Sodium 136 - 145 mmol/L 138 140 140  Potassium 3.5 - 5.1 mmol/L 3.7 4.2 4.0  Chloride 98 - 109 mmol/L 104 - -  CO2 22 - 29 mmol/L '24 27 22  ' Calcium 8.4 - 10.4 mg/dL 9.7 9.8 9.0  Total Protein 6.4 - 8.3 g/dL 7.7 7.5 7.1  Total Bilirubin 0.2 - 1.2 mg/dL 0.6 0.43 0.41  Alkaline Phos 40 - 150 U/L 103 95 101  AST 5 - 34 U/L '8 10 13  ' ALT 0 - 55 U/L '11 14 18    ' Results for DEANZA, UPPERMAN (MRN [id number]) as of 12/18/2016 21:25  Ref. Range 10/28/2015 14:31 03/23/2016 14:49 12/17/2016 12:55  CA 27.29 Latest Ref Range: <38 U/mL 31    CA 27.29 Latest Ref Range: 0.0 - 38.6 U/mL 27.7    CA 125 Latest Ref  Range: <35 U/mL 8 7   Cancer Antigen (CA) 125 Latest Ref Range: 0.0 - 38.1 U/mL 9.2 9.2 9.4  PENDING for 06/24/17    RADIOGRAPHIC STUDIES: I have personally reviewed the radiological images as listed and agreed with the findings in the report. No results found.    Screening Mammogram 03/15/17 IMPRESSION: No mammographic evidence of malignancy. A result letter of this screening mammogram will be mailed directly to the patient. RECOMMENDATION: Screening mammogram in one year.    MRI Breast Bilateral 04/06/16 IMPRESSION: No evidence of malignancy. RECOMMENDATION: Bilateral screening mammogram in 11 months when due.  Screening mammogram W/Implant 03/02/16 IMPRESSION: No mammographic evidence of malignancy. A result letter of this screening mammogram will be mailed directly to the patient. RECOMMENDATION: Screening mammogram in one year   ASSESSMENT & PLAN:  [name] has a past medical history of hypothyroidism and vitamin D deficiency.   1. History of Ovarian Carcinoma, recurrent, NED now  -I have reviewed her history extensively, and confirmed the ley  findings with patient.  -she initially diagnosed with stage IIIC in 1998 and pelvic recurrent 2008, s/u surgery and chemo  -She is on surveillance with yearly pelvic exams from her OB now. Last surveillance PET scan in 2013 showed no evidence of disease. -She has been clinically doing very well, no complaints. Her CA 125 has been normal, no clinically concerning for recurrence. -Labs reviewed, her CBC and CMP are unremarkable.  -f/u in 6 months as the patient wants to be followed closely.    2. History of Left Breast DCIS -post left mastectomy and 13 node axillary dissection -left breast reconstruction and right subpectoral implant -She is on breast surveillance with yearly mammograms, and breast exams with yearly follow ups.  -I suggest she separates her mammogram and MRI with 6 months apart. Due to her BRCA1 mutation, yearly screening MRI is recommended.   -From a breast cancer standpoint she is clinically doing well. Her physical exam and her 02/2017 mammogram were unremarkable. There is no clinical concern for recurrence. -Her next mammogram will be 02/2017 and breast MRI 08/2017   3. Genetics -Pt is BRCA 1 mutation positive -Due to her advanced age, right prophylactic mastectomy was not recommended. -We will continue annual mammogram and breast MRI for close monitoring. -I again strongly encouraged her to get her two daughters tested for BRCA 1 mutation. I discussed the importance and what can be done for them. They need regular mammograms and exams with their gynecologist. She states she has told her daughters but they have not been tested    4. 20+ years on supplemental estrogen, estradiol -both Dr. Curlene Dolphin and I recommended stopping this medication, particularly given known risk factors for breast and gyn cancers. She decreased dose to half tablet but prefers to continue.   5. Dementia  -She has developed mild memory issue, currently on donepezil, she is function well at  home.  PLAN:  -Pt requests close f/u, will see her back in 6 months with lab -Breast MRI in 08/2017    No orders of the defined types were placed in this encounter.  All questions were answered. The patient knows to call the clinic with any problems, questions or concerns. No barriers to learning was detected.  I spent 20 minutes counseling the patient face to face. The total time spent in the appointment was 25 minutes and more than 50% was on counseling and review of test results  This document serves as a record of  services personally performed by Truitt Merle, MD. It was created on her behalf by Joslyn Devon, a trained medical scribe. The creation of this record is based on the scribe's personal observations and the provider's statements to them.    I have reviewed the above documentation for accuracy and completeness, and I agree with the above.     Truitt Merle, MD 06/24/2017

## 2017-06-24 ENCOUNTER — Inpatient Hospital Stay (HOSPITAL_BASED_OUTPATIENT_CLINIC_OR_DEPARTMENT_OTHER): Payer: Medicare Other | Admitting: Hematology

## 2017-06-24 ENCOUNTER — Inpatient Hospital Stay: Payer: Medicare Other | Attending: Hematology

## 2017-06-24 ENCOUNTER — Encounter: Payer: Self-pay | Admitting: Hematology

## 2017-06-24 ENCOUNTER — Telehealth: Payer: Self-pay | Admitting: Hematology

## 2017-06-24 VITALS — BP 139/56 | HR 63 | Temp 97.6°F | Resp 16 | Ht 67.0 in | Wt 147.5 lb

## 2017-06-24 DIAGNOSIS — Z8543 Personal history of malignant neoplasm of ovary: Secondary | ICD-10-CM | POA: Insufficient documentation

## 2017-06-24 DIAGNOSIS — E039 Hypothyroidism, unspecified: Secondary | ICD-10-CM | POA: Insufficient documentation

## 2017-06-24 DIAGNOSIS — Z86000 Personal history of in-situ neoplasm of breast: Secondary | ICD-10-CM

## 2017-06-24 DIAGNOSIS — Z1501 Genetic susceptibility to malignant neoplasm of breast: Secondary | ICD-10-CM | POA: Insufficient documentation

## 2017-06-24 DIAGNOSIS — Z90710 Acquired absence of both cervix and uterus: Secondary | ICD-10-CM

## 2017-06-24 DIAGNOSIS — F0390 Unspecified dementia without behavioral disturbance: Secondary | ICD-10-CM

## 2017-06-24 DIAGNOSIS — Z85828 Personal history of other malignant neoplasm of skin: Secondary | ICD-10-CM | POA: Diagnosis not present

## 2017-06-24 DIAGNOSIS — Z9012 Acquired absence of left breast and nipple: Secondary | ICD-10-CM | POA: Insufficient documentation

## 2017-06-24 DIAGNOSIS — Z1509 Genetic susceptibility to other malignant neoplasm: Secondary | ICD-10-CM

## 2017-06-24 LAB — CBC WITH DIFFERENTIAL/PLATELET
BASOS PCT: 1 %
Basophils Absolute: 0 10*3/uL (ref 0.0–0.1)
EOS ABS: 0.2 10*3/uL (ref 0.0–0.5)
EOS PCT: 2 %
HEMATOCRIT: 45.7 % (ref 34.8–46.6)
Hemoglobin: 15.3 g/dL (ref 11.6–15.9)
Lymphocytes Relative: 17 %
Lymphs Abs: 1.2 10*3/uL (ref 0.9–3.3)
MCH: 31.5 pg (ref 25.1–34.0)
MCHC: 33.5 g/dL (ref 31.5–36.0)
MCV: 94.2 fL (ref 79.5–101.0)
MONO ABS: 0.7 10*3/uL (ref 0.1–0.9)
MONOS PCT: 10 %
Neutro Abs: 4.7 10*3/uL (ref 1.5–6.5)
Neutrophils Relative %: 70 %
PLATELETS: 221 10*3/uL (ref 145–400)
RBC: 4.85 MIL/uL (ref 3.70–5.45)
RDW: 12.6 % (ref 11.2–14.5)
WBC: 6.8 10*3/uL (ref 3.9–10.3)

## 2017-06-24 LAB — COMPREHENSIVE METABOLIC PANEL
ALBUMIN: 3.4 g/dL — AB (ref 3.5–5.0)
ALT: 11 U/L (ref 0–55)
ANION GAP: 10 (ref 3–11)
AST: 8 U/L (ref 5–34)
Alkaline Phosphatase: 103 U/L (ref 40–150)
BILIRUBIN TOTAL: 0.6 mg/dL (ref 0.2–1.2)
BUN: 10 mg/dL (ref 7–26)
CHLORIDE: 104 mmol/L (ref 98–109)
CO2: 24 mmol/L (ref 22–29)
Calcium: 9.7 mg/dL (ref 8.4–10.4)
Creatinine, Ser: 0.88 mg/dL (ref 0.60–1.10)
GFR calc Af Amer: >60 mL/min (ref >60)
GFR calc non Af Amer: >60 mL/min (ref >60)
GLUCOSE: 85 mg/dL (ref 70–140)
POTASSIUM: 3.7 mmol/L (ref 3.5–5.1)
Sodium: 138 mmol/L (ref 136–145)
TOTAL PROTEIN: 7.7 g/dL (ref 6.4–8.3)

## 2017-06-24 NOTE — Telephone Encounter (Signed)
Gave avs and calendar for july °

## 2017-06-25 LAB — CA 125: Cancer Antigen (CA) 125: 11.1 U/mL (ref 0.0–38.1)

## 2017-07-01 ENCOUNTER — Telehealth: Payer: Self-pay | Admitting: *Deleted

## 2017-07-01 IMAGING — MR MR BILATERAL BREAST WITHOUT AND WITH CONTRAST
7 of 12 series · 27 of 48 positions shown · IV contrast (15 ml multihance)
Comparison: Previous breast MR's, the most recent dated 07/21/2006.
Previous mammograms, the most recent dated 03/12/2016.

CLINICAL DATA: Status post left mastectomy for breast cancer in
3333. Bilateral retropectoral saline breast implants were placed in
5233. She has also had stage III ovarian cancer.

LABS:  None obtained today.
EXAM:
BILATERAL BREAST MRI WITH AND WITHOUT CONTRAST
TECHNIQUE: Multiplanar, multisequence MR images of both breasts were obtained
prior to and following the intravenous administration of 15 ml of
MultiHance.

[Series 4: STIR · axial · 3.0mm · 0.94mm/px · 1 of 62 slices shown]
[im 1/62]
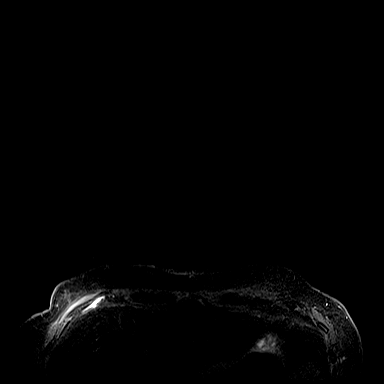

[Series 5: fl3d pre no · axial · non-contrast · 1.0mm · 0.80mm/px · z∈[-77,+130]mm · 4 of 205 slices shown]
[im 1/205]
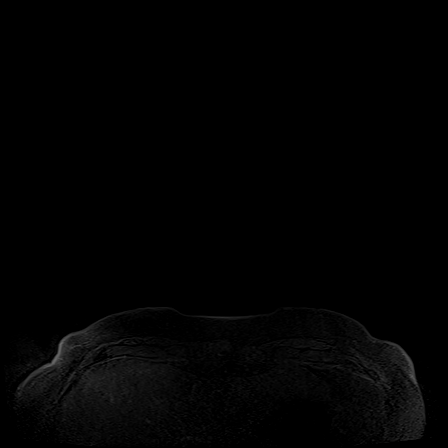
[im 69/205]
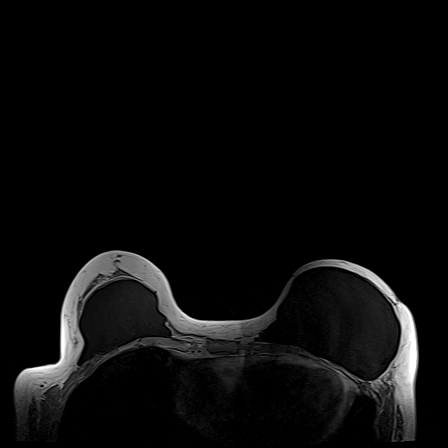
[im 137/205]
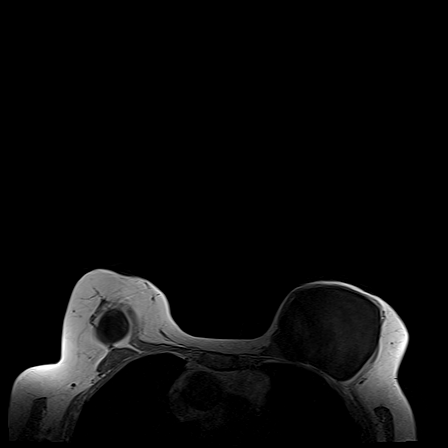
[im 205/205]
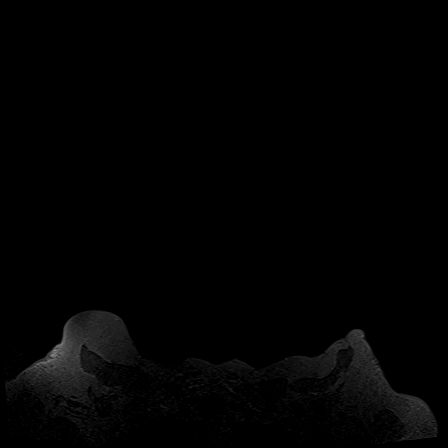

[Series 6: axial pre fs · axial · non-contrast · 1.0mm · 0.80mm/px · z∈[-89,+150]mm · 5 of 240 slices shown]
[im 1/240]
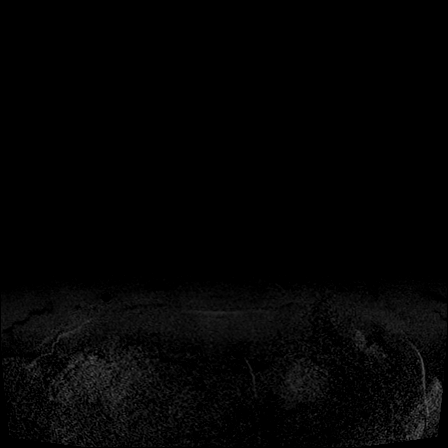
[im 60/240]
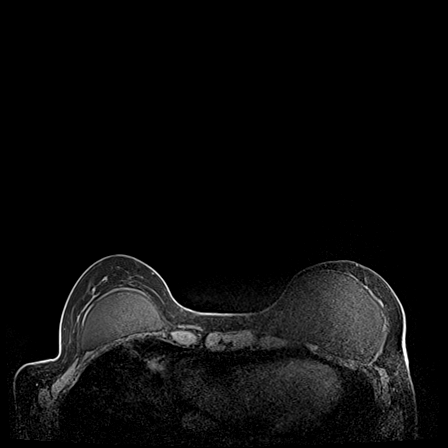
[im 120/240]
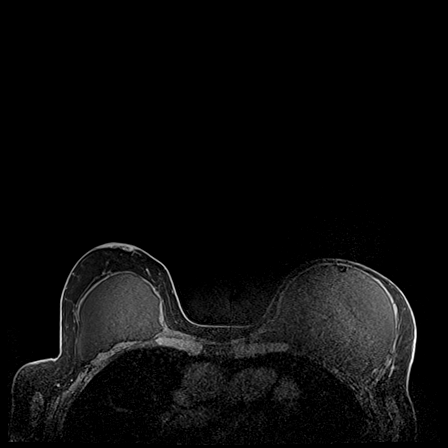
[im 180/240]
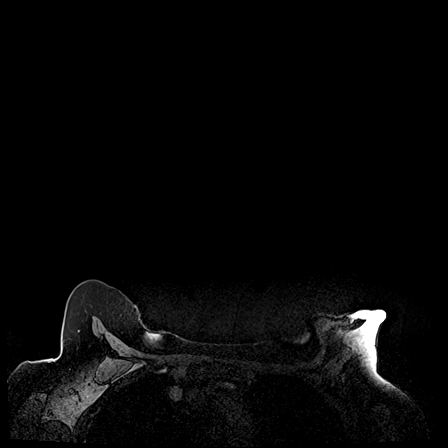
[im 240/240]
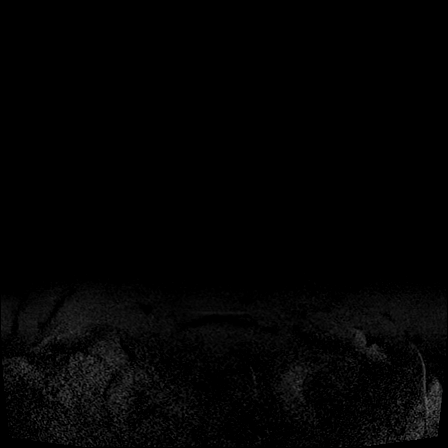

[Series 7: axial post 20 · axial · 1.0mm · 0.80mm/px · z∈[-89,+150]mm · 5 of 240 slices shown (1 of 3)]
[im 1/240]
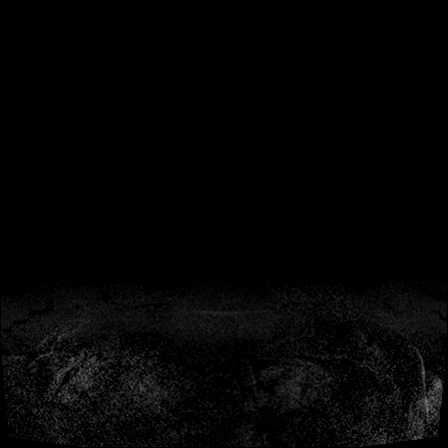
[im 60/240]
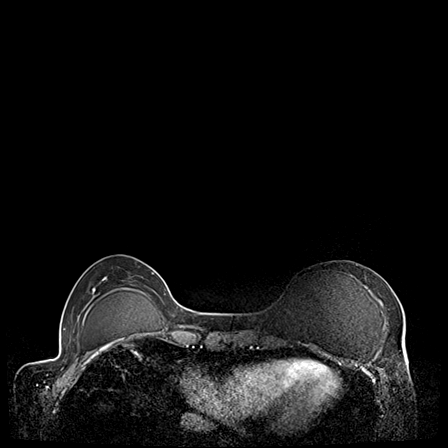
[im 120/240]
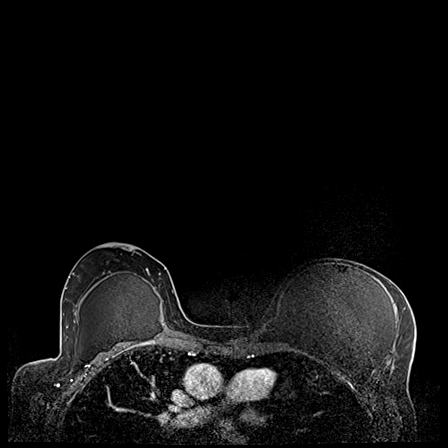
[im 180/240]
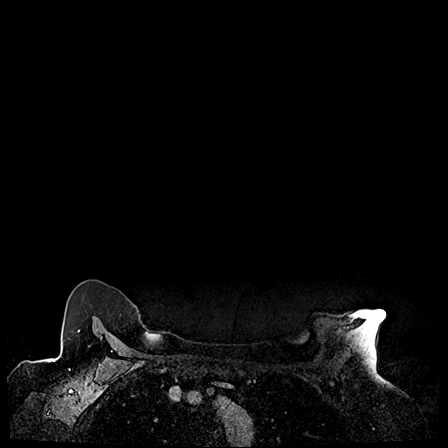
[im 240/240]
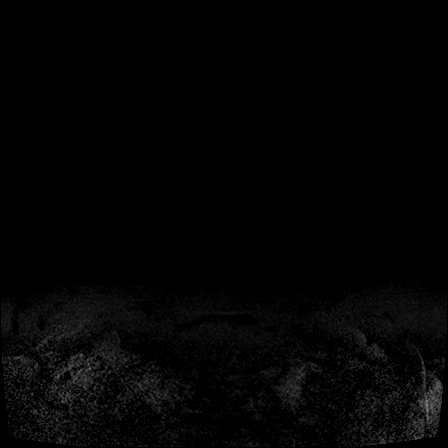

[Series 8: axial post 20 · axial · 1.0mm · 0.80mm/px · z∈[-89,+150]mm · 6 of 240 slices shown (2 of 3)]
[im 1/240]
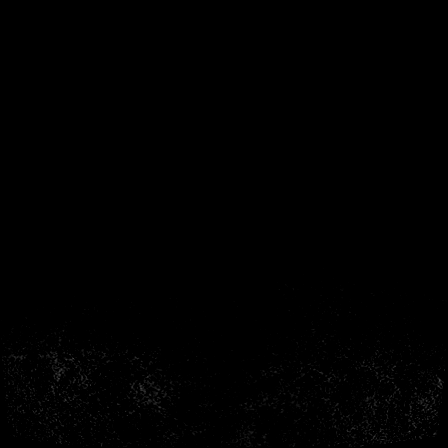
[im 48/240]
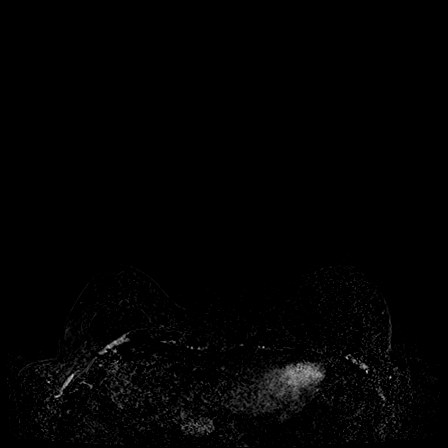
[im 96/240]
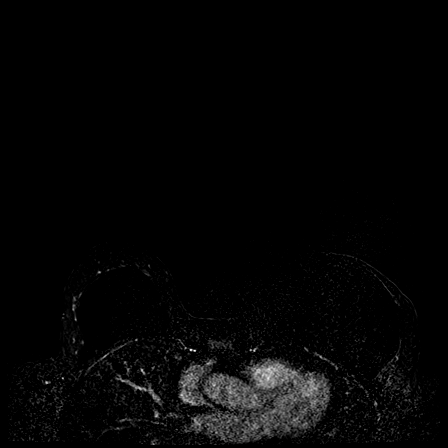
[im 144/240]
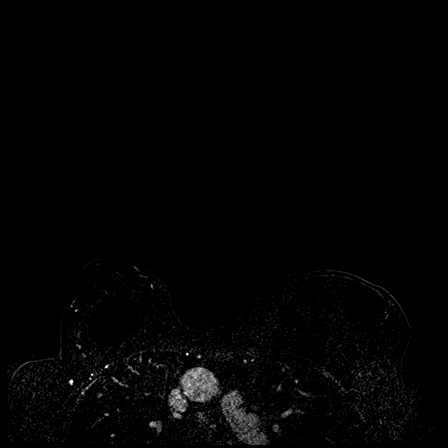
[im 192/240]
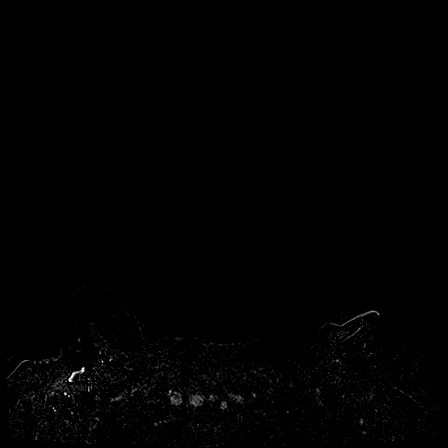
[im 240/240]
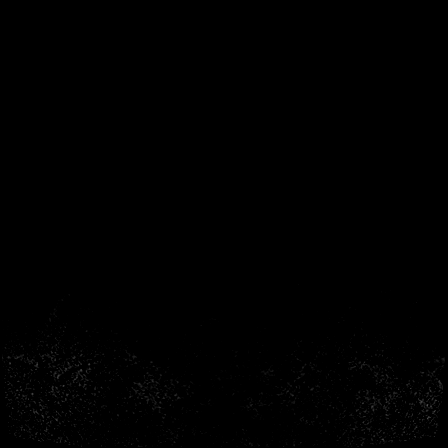

[Series 9: axial post 20 · axial · 240.0mm · 0.80mm/px · 1 of 1 slices shown (3 of 3)]
[im 1/1]
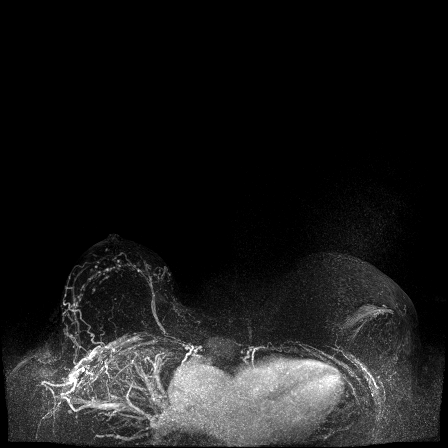

[Series 10: axial post 3 · axial · 1.0mm · 0.80mm/px · z∈[-89,+102]mm · 5 of 240 slices shown]
[im 1/240]
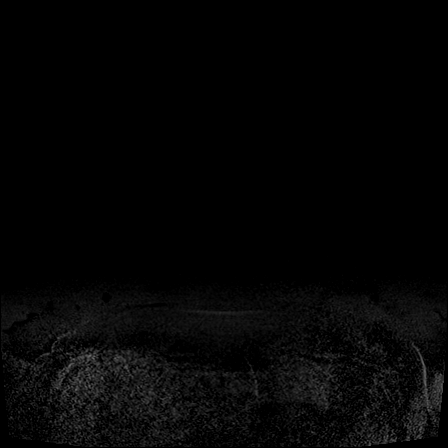
[im 48/240]
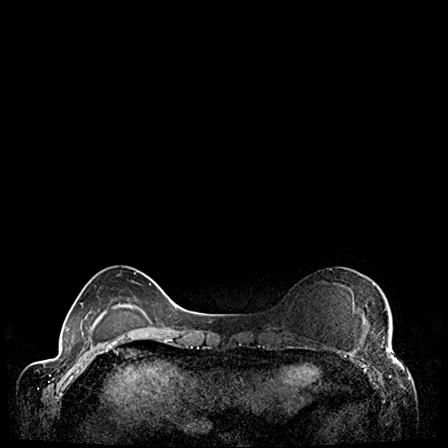
[im 96/240]
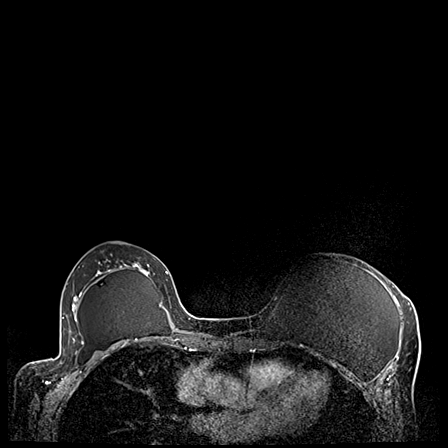
[im 144/240]
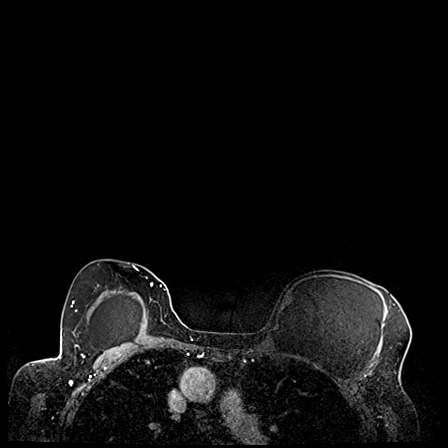
[im 192/240]
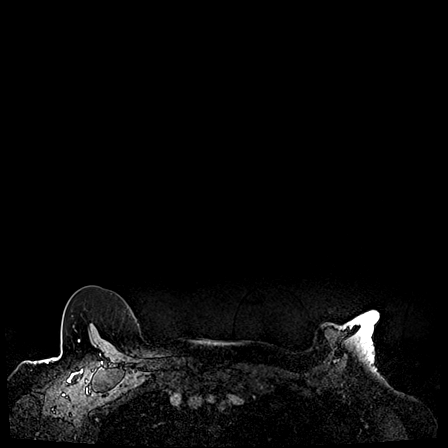

[27 of 48 positions shown; findings below may reference images not displayed]

THREE-DIMENSIONAL MR IMAGE RENDERING ON INDEPENDENT WORKSTATION:

Three-dimensional MR images were rendered by post-processing of the
original MR data on an independent workstation. The
three-dimensional MR images were interpreted, and findings are
reported in the following complete MRI report for this study. Three
dimensional images were evaluated at the independent [employer]
workstation
FINDINGS: Breast composition: b. Scattered fibroglandular tissue.

Background parenchymal enhancement: Minimal.

Right breast: No mass or abnormal enhancement. The retropectoral
implant is intact.

Left breast: Status post left mastectomy with an intact
retropectoral implant.

Lymph nodes: No abnormal appearing lymph nodes.

Ancillary findings:  None.
IMPRESSION: No evidence of malignancy.

RECOMMENDATION:
Bilateral screening mammogram in 11 months when due.

BI-RADS CATEGORY  1: Negative.

## 2017-07-01 NOTE — Telephone Encounter (Signed)
-----   Message from Truitt Merle, MD sent at 06/26/2017  3:18 PM EST ----- Please let her know the lab results, including CBC, CMP and CA125 are all normal, no concerns.   Thanks   Truitt Merle

## 2017-07-01 NOTE — Telephone Encounter (Signed)
Spoke with pt and informed pt of labs results - all normal as per Dr. Ernestina Penna instructions.  Pt voiced understanding, and stated she had seen results on MyChart.

## 2017-07-07 DIAGNOSIS — D2271 Melanocytic nevi of right lower limb, including hip: Secondary | ICD-10-CM | POA: Diagnosis not present

## 2017-07-07 DIAGNOSIS — L853 Xerosis cutis: Secondary | ICD-10-CM | POA: Diagnosis not present

## 2017-07-07 DIAGNOSIS — L570 Actinic keratosis: Secondary | ICD-10-CM | POA: Diagnosis not present

## 2017-07-07 DIAGNOSIS — D692 Other nonthrombocytopenic purpura: Secondary | ICD-10-CM | POA: Diagnosis not present

## 2017-07-07 DIAGNOSIS — L814 Other melanin hyperpigmentation: Secondary | ICD-10-CM | POA: Diagnosis not present

## 2017-07-07 DIAGNOSIS — D2272 Melanocytic nevi of left lower limb, including hip: Secondary | ICD-10-CM | POA: Diagnosis not present

## 2017-07-07 DIAGNOSIS — Z85828 Personal history of other malignant neoplasm of skin: Secondary | ICD-10-CM | POA: Diagnosis not present

## 2017-07-07 DIAGNOSIS — D225 Melanocytic nevi of trunk: Secondary | ICD-10-CM | POA: Diagnosis not present

## 2017-08-20 ENCOUNTER — Telehealth: Payer: Self-pay | Admitting: Internal Medicine

## 2017-08-20 NOTE — Telephone Encounter (Unsigned)
Copied from [facility]. Topic: Quick Communication - Rx Refill/Question >> Aug 20, 2017  4:29 PM Neva Seat wrote: levothyroxine (SYNTHROID, LEVOTHROID) 88 MCG tablet  Pt has been without Rx for months.  [place] [place] [address] Phone: [phone] Fax: [phone]

## 2017-08-20 NOTE — Telephone Encounter (Signed)
Medication  Request Synthyriod  88  Mcg tabs  LOV 01-05-2017 Last filled   06/16/2016   90  Pills   3  Refills  Last TSH   06/20/2014

## 2017-08-24 ENCOUNTER — Other Ambulatory Visit: Payer: Self-pay

## 2017-08-24 MED ORDER — LEVOTHYROXINE SODIUM 88 MCG PO TABS: 88.0000 ug | ORAL_TABLET | Freq: Every day | ORAL | 0 refills | Status: DC

## 2017-09-22 ENCOUNTER — Ambulatory Visit (INDEPENDENT_AMBULATORY_CARE_PROVIDER_SITE_OTHER): Payer: Medicare Other | Admitting: Internal Medicine

## 2017-09-22 ENCOUNTER — Encounter: Payer: Self-pay | Admitting: Internal Medicine

## 2017-09-22 VITALS — BP 120/64 | HR 69 | Temp 97.8°F | Ht 67.5 in | Wt 144.0 lb

## 2017-09-22 DIAGNOSIS — Z8543 Personal history of malignant neoplasm of ovary: Secondary | ICD-10-CM

## 2017-09-22 DIAGNOSIS — Z853 Personal history of malignant neoplasm of breast: Secondary | ICD-10-CM | POA: Diagnosis not present

## 2017-09-22 DIAGNOSIS — E039 Hypothyroidism, unspecified: Secondary | ICD-10-CM

## 2017-09-22 DIAGNOSIS — E785 Hyperlipidemia, unspecified: Secondary | ICD-10-CM

## 2017-09-22 DIAGNOSIS — Z23 Encounter for immunization: Secondary | ICD-10-CM | POA: Diagnosis not present

## 2017-09-22 DIAGNOSIS — E559 Vitamin D deficiency, unspecified: Secondary | ICD-10-CM

## 2017-09-22 NOTE — Progress Notes (Signed)
Subjective:    Patient ID: [name], female    DOB: 07-14-1940, 77 y.o.   MRN: [id number]  HPI  77 year old patient who is seen today for her annual follow-up.  She is followed closely by gynecology as well as oncology with a history of breast and ovarian cancer. Last colonoscopy approximately 5 years ago.  She is status post partial colectomy for what sounds like metastatic ovarian cancer.  5-year interval colonoscopy was recommended  Doing quite well today without concerns or complaints She does have a history of hypothyroidism but has been off levothyroxine for about 6 months.  Past Medical History:  Diagnosis Date  . DCIS (ductal carcinoma in situ) of breast   . Hypothyroidism   . Ovarian cancer [facility])    March 1998  . Unspecified vitamin D deficiency      Social History   Socioeconomic History  . Marital status: Divorced    Spouse name: Not on file  . Number of children: Not on file  . Years of education: Not on file  . Highest education level: Not on file  Occupational History  . Not on file  Social Needs  . Financial resource strain: Not on file  . Food insecurity:    Worry: Not on file    Inability: Not on file  . Transportation needs:    Medical: Not on file    Non-medical: Not on file  Tobacco Use  . Smoking status: Former Smoker    Last attempt to quit: 05/26/1991    Years since quitting: 26.3  . Smokeless tobacco: Never Used  Substance and Sexual Activity  . Alcohol use: Yes    Comment: social  . Drug use: No  . Sexual activity: Not on file  Lifestyle  . Physical activity:    Days per week: Not on file    Minutes per session: Not on file  . Stress: Not on file  Relationships  . Social connections:    Talks on phone: Not on file    Gets together: Not on file    Attends religious service: Not on file    Active member of club or organization: Not on file    Attends meetings of clubs or organizations: Not on file    Relationship status: Not  on file  . Intimate partner violence:    Fear of current or ex partner: Not on file    Emotionally abused: Not on file    Physically abused: Not on file    Forced sexual activity: Not on file  Other Topics Concern  . Not on file  Social History Narrative   March 1998- history of stage III ovarian cancer; status post suboptimal debulking; status post 6 cycles of Taxoil and carboplatin with complete remission documented by second look surgery September 1998.      Recurrent ovarian cancer, status post 6 cycles carbo and Taxol, April 1998 until July 2008      1992, left breast intraductal carcinoma (DCIS)   Status post left modified radial mastectomy and with silicon implant   13 of 13 lymph modes negative; positive for BCRA1 gene   Recurrent ovarian carinoma, February 2008   Admit Feb. 2009 partial small bowel obstruction treated medically    Past Surgical History:  Procedure Laterality Date  . BIOPSY THYROID     for benign disease  . BREAST SURGERY    . CHOLECYSTECTOMY    . COLON SURGERY     colectomy for cancer  .  colonscopy  2004   2/08  . left ankle fracture     status post surgery, March 2010  . MASTECTOMY  1992   left mastectomy  . PORT-A-CATH REMOVAL Right 04/15/2015   Procedure: REMOVAL PORT-A-CATH;  Surgeon: [name], MD;  Location: [place];  Service: General;  Laterality: Right;  Right upper chest  . TOTAL ABDOMINAL HYSTERECTOMY W/ BILATERAL SALPINGOOPHORECTOMY  1998   TAH for ovarian cancer    Family History  Problem Relation Age of Onset  . Cancer Mother        [name], historyof melanoma   . Aneurysm Father        abdominal aortic  . Heart disease Father   . Diverticulitis Brother   . Breast cancer Maternal Aunt     Allergies  Allergen Reactions  . Codeine Sulfate Nausea And Vomiting    REACTION: unspecified  . Promethazine Hcl     REACTION: unspecified    Current Outpatient Medications on File Prior to Visit  Medication  Sig Dispense Refill  . estradiol (ESTRACE) 1 MG tablet TAKE 1 TABLET EVERY DAY (Patient taking differently: TAKE HALF TABLET DAILY) 90 tablet 3  . multivitamin (THERAGRAN) per tablet Take 1 tablet by mouth daily.      Marland Kitchen donepezil (ARICEPT) 10 MG tablet Take 1 tablet (10 mg total) by mouth at bedtime. (Patient not taking: Reported on 09/22/2017) 90 tablet 3  . levothyroxine (SYNTHROID, LEVOTHROID) 88 MCG tablet Take 1 tablet (88 mcg total) by mouth daily. (Patient not taking: Reported on 09/22/2017) 30 tablet 0  . triamcinolone cream (KENALOG) 0.1 %      No current facility-administered medications on file prior to visit.     BP 120/64 (BP Location: Right Arm, Patient Position: Sitting, Cuff Size: Normal)   Pulse 69   Temp 97.8 F (36.6 C) (Oral)   Ht 5' 7.5" (1.715 m)   Wt 144 lb (65.3 kg)   SpO2 96%   BMI 22.22 kg/m     Review of Systems  Constitutional: Negative.   HENT: Negative for congestion, dental problem, hearing loss, rhinorrhea, sinus pressure, sore throat and tinnitus.   Eyes: Negative for pain, discharge and visual disturbance.  Respiratory: Negative for cough and shortness of breath.   Cardiovascular: Negative for chest pain, palpitations and leg swelling.  Gastrointestinal: Negative for abdominal distention, abdominal pain, blood in stool, constipation, diarrhea, nausea and vomiting.  Genitourinary: Negative for difficulty urinating, dysuria, flank pain, frequency, hematuria, pelvic pain, urgency, vaginal bleeding, vaginal discharge and vaginal pain.  Musculoskeletal: Negative for arthralgias, gait problem and joint swelling.  Skin: Negative for rash.  Neurological: Negative for dizziness, syncope, speech difficulty, weakness, numbness and headaches.  Hematological: Negative for adenopathy.  Psychiatric/Behavioral: Negative for agitation, behavioral problems and dysphoric mood. The patient is not nervous/anxious.        Objective:   Physical Exam  Constitutional: She  is oriented to person, place, and time. She appears well-developed and well-nourished.  HENT:  Head: Normocephalic.  Right Ear: External ear normal.  Left Ear: External ear normal.  Mouth/Throat: Oropharynx is clear and moist.  Eyes: Pupils are equal, round, and reactive to light. Conjunctivae and EOM are normal.  Neck: Normal range of motion. Neck supple. No thyromegaly present.  Cardiovascular: Normal rate, regular rhythm, normal heart sounds and intact distal pulses.  Blood pressure low normal  Pulmonary/Chest: Effort normal and breath sounds normal.  Surgical scars right upper anterior chest from prior Port-A-Cath  Abdominal: Soft.  Bowel sounds are normal. She exhibits no mass. There is no tenderness.  Multiple abdominal surgical scars  Musculoskeletal: Normal range of motion.  Lymphadenopathy:    She has no cervical adenopathy.  Neurological: She is alert and oriented to person, place, and time.  Skin: Skin is warm and dry. No rash noted.  Psychiatric: She has a normal mood and affect. Her behavior is normal.  Vitals reviewed.         Assessment & Plan:   Hypothyroidism.  Patient has been off supplemental levothyroxine for 6 months.  Will review a TSH. History of ovarian and breast cancer.  Follow-up oncology and gynecology  Preventive health.  We will set up for follow-up colonoscopy.  Recent lab reviewed.  Will check an updated TSH and lipid profile.  In view of her history of vitamin D deficiency we will check a level  Follow-up multiple consultants Return here in 1 year or as needed  [employer]

## 2017-09-22 NOTE — Patient Instructions (Signed)
Oncology follow-up as scheduled  Gynecologic follow-up as scheduled  Schedule your colonoscopy to help detect colon cancer.    It is important that you exercise regularly, at least 20 minutes 3 to 4 times per week.  If you develop chest pain or shortness of breath seek  medical attention.  Take a calcium supplement, plus [phone] units of vitamin D

## 2017-11-04 ENCOUNTER — Telehealth: Payer: Self-pay | Admitting: Family Medicine

## 2017-11-04 NOTE — Telephone Encounter (Signed)
Copied from [place] [id number]. Topic: General - Other >> Nov 04, 2017  2:53 PM Lennox Solders wrote: Reason for CRM:Pt is calling and would like dr Burnice Logan to return her call. Pt is aware md out of office until Monday. Pt would like a recommendation and she did not elaborate reason and requesting dr Burnice Logan to return her call

## 2017-11-04 NOTE — Telephone Encounter (Signed)
Noted  

## 2017-11-16 ENCOUNTER — Ambulatory Visit (INDEPENDENT_AMBULATORY_CARE_PROVIDER_SITE_OTHER): Payer: Medicare Other | Admitting: Internal Medicine

## 2017-11-16 ENCOUNTER — Encounter: Payer: Self-pay | Admitting: Internal Medicine

## 2017-11-16 ENCOUNTER — Other Ambulatory Visit: Payer: Self-pay | Admitting: Internal Medicine

## 2017-11-16 VITALS — BP 108/62 | HR 66 | Temp 98.0°F | Ht 67.25 in | Wt 148.0 lb

## 2017-11-16 DIAGNOSIS — G609 Hereditary and idiopathic neuropathy, unspecified: Secondary | ICD-10-CM

## 2017-11-16 DIAGNOSIS — Z1239 Encounter for other screening for malignant neoplasm of breast: Secondary | ICD-10-CM

## 2017-11-16 DIAGNOSIS — Z0000 Encounter for general adult medical examination without abnormal findings: Secondary | ICD-10-CM

## 2017-11-16 DIAGNOSIS — E785 Hyperlipidemia, unspecified: Secondary | ICD-10-CM

## 2017-11-16 DIAGNOSIS — E039 Hypothyroidism, unspecified: Secondary | ICD-10-CM | POA: Diagnosis not present

## 2017-11-16 DIAGNOSIS — Z1211 Encounter for screening for malignant neoplasm of colon: Secondary | ICD-10-CM

## 2017-11-16 DIAGNOSIS — Z8543 Personal history of malignant neoplasm of ovary: Secondary | ICD-10-CM

## 2017-11-16 LAB — LIPID PANEL
CHOL/HDL RATIO: 4
CHOLESTEROL: 245 mg/dL — AB (ref 0–200)
HDL: 58.5 mg/dL (ref >39.00)
LDL CALC: 160 mg/dL — AB (ref 0–99)
NonHDL: 186.64
TRIGLYCERIDES: 131 mg/dL (ref 0.0–149.0)
VLDL: 26.2 mg/dL (ref 0.0–40.0)

## 2017-11-16 LAB — COMPREHENSIVE METABOLIC PANEL
ALBUMIN: 4.4 g/dL (ref 3.5–5.2)
ALT: 16 U/L (ref 0–35)
AST: 11 U/L (ref 0–37)
Alkaline Phosphatase: 94 U/L (ref 39–117)
BILIRUBIN TOTAL: 0.6 mg/dL (ref 0.2–1.2)
BUN: 14 mg/dL (ref 6–23)
CALCIUM: 10.1 mg/dL (ref 8.4–10.5)
CHLORIDE: 102 meq/L (ref 96–112)
CO2: 28 meq/L (ref 19–32)
CREATININE: 0.84 mg/dL (ref 0.40–1.20)
GFR: 69.65 mL/min (ref >60.00)
Glucose, Bld: 77 mg/dL (ref 70–99)
Potassium: 4.1 mEq/L (ref 3.5–5.1)
Sodium: 139 mEq/L (ref 135–145)
Total Protein: 7.2 g/dL (ref 6.0–8.3)

## 2017-11-16 LAB — TSH: TSH: 7.15 u[IU]/mL — ABNORMAL HIGH (ref 0.35–4.50)

## 2017-11-16 MED ORDER — LEVOTHYROXINE SODIUM 88 MCG PO TABS: 88.0000 ug | ORAL_TABLET | Freq: Every day | ORAL | 5 refills | Status: DC

## 2017-11-16 NOTE — Patient Instructions (Signed)
Limit your sodium (Salt) intake    It is important that you exercise regularly, at least 20 minutes 3 to 4 times per week.  If you develop chest pain or shortness of breath seek  medical attention.  Take a calcium supplement, plus [phone] units of vitamin D  Return in one year for follow-up  Follow-up with OB/GYN and oncology  Cologuard screening

## 2017-11-16 NOTE — Addendum Note (Signed)
Addended by: Gwenyth Ober R on: 11/16/2017 04:43 PM   Modules accepted: Orders

## 2017-11-16 NOTE — Progress Notes (Signed)
Subjective:    Patient ID: [name], female    DOB: [birth year], 77 y.o.   MRN: [id number]  HPI  77 year old patient who is seen today for follow-up and for subsequent Medicare wellness visit  She is followed by oncology with a history of ovarian and breast cancer.  She is scheduled for oncology follow-up next month She has a history of hypothyroidism but has been off levothyroxine for a number of months Remains on Aricept due to mild cognitive impairment.  Past Medical History:  Diagnosis Date  . DCIS (ductal carcinoma in situ) of breast   . Hypothyroidism   . Ovarian cancer [facility])    March 1998  . Unspecified vitamin D deficiency      Social History   Socioeconomic History  . Marital status: Divorced    Spouse name: Not on file  . Number of children: Not on file  . Years of education: Not on file  . Highest education level: Not on file  Occupational History  . Not on file  Social Needs  . Financial resource strain: Not on file  . Food insecurity:    Worry: Not on file    Inability: Not on file  . Transportation needs:    Medical: Not on file    Non-medical: Not on file  Tobacco Use  . Smoking status: Former Smoker    Last attempt to quit: 05/26/1991    Years since quitting: 26.4  . Smokeless tobacco: Never Used  Substance and Sexual Activity  . Alcohol use: Yes    Comment: social  . Drug use: No  . Sexual activity: Not on file  Lifestyle  . Physical activity:    Days per week: Not on file    Minutes per session: Not on file  . Stress: Not on file  Relationships  . Social connections:    Talks on phone: Not on file    Gets together: Not on file    Attends religious service: Not on file    Active member of club or organization: Not on file    Attends meetings of clubs or organizations: Not on file    Relationship status: Not on file  . Intimate partner violence:    Fear of current or ex partner: Not on file    Emotionally abused: Not on file   Physically abused: Not on file    Forced sexual activity: Not on file  Other Topics Concern  . Not on file  Social History Narrative   March 1998- history of stage III ovarian cancer; status post suboptimal debulking; status post 6 cycles of Taxoil and carboplatin with complete remission documented by second look surgery September 1998.      Recurrent ovarian cancer, status post 6 cycles carbo and Taxol, April 1998 until July 2008      1992, left breast intraductal carcinoma (DCIS)   Status post left modified radial mastectomy and with silicon implant   13 of 13 lymph modes negative; positive for BCRA1 gene   Recurrent ovarian carinoma, February 2008   Admit Feb. 2009 partial small bowel obstruction treated medically    Past Surgical History:  Procedure Laterality Date  . BIOPSY THYROID     for benign disease  . BREAST SURGERY    . CHOLECYSTECTOMY    . COLON SURGERY     colectomy for cancer  . colonscopy  2004   2/08  . left ankle fracture     status post surgery, March  2010  . [place]   left mastectomy  . PORT-A-CATH REMOVAL Right 04/15/2015   Procedure: REMOVAL PORT-A-CATH;  Surgeon: [name], MD;  Location: [place];  Service: General;  Laterality: Right;  Right upper chest  . TOTAL ABDOMINAL HYSTERECTOMY W/ BILATERAL SALPINGOOPHORECTOMY  1998   TAH for ovarian cancer    Family History  Problem Relation Age of Onset  . Cancer Mother        [name], historyof melanoma   . Aneurysm Father        abdominal aortic  . Heart disease Father   . Diverticulitis Brother   . Breast cancer Maternal Aunt     Allergies  Allergen Reactions  . Codeine Sulfate Nausea And Vomiting    REACTION: unspecified  . Promethazine Hcl     REACTION: unspecified    Current Outpatient Medications on File Prior to Visit  Medication Sig Dispense Refill  . donepezil (ARICEPT) 10 MG tablet Take 1 tablet (10 mg total) by mouth at bedtime. 90 tablet 3  .  estradiol (ESTRACE) 1 MG tablet TAKE 1 TABLET EVERY DAY (Patient taking differently: TAKE HALF TABLET DAILY) 90 tablet 3  . levothyroxine (SYNTHROID, LEVOTHROID) 88 MCG tablet Take 1 tablet (88 mcg total) by mouth daily. 30 tablet 0  . multivitamin (THERAGRAN) per tablet Take 1 tablet by mouth daily.      Marland Kitchen triamcinolone cream (KENALOG) 0.1 %      No current facility-administered medications on file prior to visit.     BP 108/62 (BP Location: Right Arm, Patient Position: Sitting, Cuff Size: Normal)   Pulse 66   Temp 98 F (36.7 C) (Oral)   Ht 5' 7.25" (1.708 m)   Wt 148 lb (67.1 kg)   SpO2 99%   BMI 23.01 kg/m   Subsequent Medicare wellness visit  1. Risk factors, based on past  M,S,F history.  No significant cardiovascular risk factors  2.  Physical activities: Remains fairly active physically.  Walks 3-6 times per week usually a mile and a half.  No exercise limitations  3.  Depression/mood: No history of major depression or mood disorder  4.  Hearing: No deficit  5.  ADL's: Independent  6.  Fall risk: Low   7.  Home safety: No issues identified  8.  Height weight, and visual acuity; height and weight stable no change in visual acuity  9.  Counseling:  Heart healthy diet recommended continue active exercise regimen 10. Lab orders based on risk factors: Laboratory update will be reviewed  11. Referral : Follow-up oncology and gynecology  12. Care plan: Continue efforts at aggressive risk factor modification  13. Cognitive assessment: Alert and appropriate normal affect.  Patient on Aricept for mild cognitive impairment last MMSE 27 out of 30.  Able to recall 0 of 3 objects  14. Screening: Patient provided with a written and personalized 5-10 year screening schedule in the AVS.    15. Provider List Update: Primary care oncology gynecology    Review of Systems  Constitutional: Negative.   HENT: Negative for congestion, dental problem, hearing loss, rhinorrhea,  sinus pressure, sore throat and tinnitus.   Eyes: Negative for pain, discharge and visual disturbance.  Respiratory: Negative for cough and shortness of breath.   Cardiovascular: Negative for chest pain, palpitations and leg swelling.  Gastrointestinal: Negative for abdominal distention, abdominal pain, blood in stool, constipation, diarrhea, nausea and vomiting.  Genitourinary: Negative for difficulty urinating, dysuria, flank pain, frequency, hematuria,  pelvic pain, urgency, vaginal bleeding, vaginal discharge and vaginal pain.  Musculoskeletal: Negative for arthralgias, gait problem and joint swelling.  Skin: Negative for rash.  Neurological: Negative for dizziness, syncope, speech difficulty, weakness, numbness and headaches.  Hematological: Negative for adenopathy.  Psychiatric/Behavioral: Negative for agitation, behavioral problems and dysphoric mood. The patient is not nervous/anxious.        Objective:   Physical Exam  Constitutional: She is oriented to person, place, and time. She appears well-developed and well-nourished.  Blood pressure low normal  HENT:  Head: Normocephalic.  Right Ear: External ear normal.  Left Ear: External ear normal.  Mouth/Throat: Oropharynx is clear and moist.  Eyes: Pupils are equal, round, and reactive to light. Conjunctivae and EOM are normal.  Neck: Normal range of motion. Neck supple. No thyromegaly present.  Cardiovascular: Normal rate, regular rhythm, normal heart sounds and intact distal pulses.  Pulmonary/Chest: Effort normal and breath sounds normal.  Abdominal: Soft. Bowel sounds are normal. She exhibits no mass. There is no tenderness.  Multiple abdominal scars  Musculoskeletal: Normal range of motion.  Lymphadenopathy:    She has no cervical adenopathy.  Neurological: She is alert and oriented to person, place, and time.  Skin: Skin is warm and dry. No rash noted.  Surgical scar right upper anterior chest from prior Port-A-Cath    Psychiatric: She has a normal mood and affect. Her behavior is normal.          Assessment & Plan:   Mild cognitive impairment.  Continue Aricept History of ovarian and breast cancer follow-up oncology and gynecology Subsequent Medicare wellness visit Hypothyroidism.  Patient has been off supplement for a number of months.  Will review a TSH Preventive health.  Patient declines follow-up colonoscopy her last colonoscopy was about 5 years ago.  Will order Cologuard  Follow-up in 1 year with new provider  Marletta Lor

## 2017-11-18 ENCOUNTER — Other Ambulatory Visit: Payer: Self-pay

## 2017-11-23 DIAGNOSIS — Z1211 Encounter for screening for malignant neoplasm of colon: Secondary | ICD-10-CM | POA: Diagnosis not present

## 2017-11-24 LAB — COLOGUARD: COLOGUARD: NEGATIVE

## 2017-11-30 ENCOUNTER — Encounter: Payer: Self-pay | Admitting: Internal Medicine

## 2017-12-06 ENCOUNTER — Encounter: Payer: Self-pay | Admitting: Internal Medicine

## 2017-12-20 ENCOUNTER — Inpatient Hospital Stay: Payer: Medicare Other | Attending: Hematology | Admitting: Hematology

## 2017-12-20 ENCOUNTER — Inpatient Hospital Stay: Payer: Medicare Other

## 2017-12-20 ENCOUNTER — Encounter: Payer: Self-pay | Admitting: Hematology

## 2017-12-20 ENCOUNTER — Telehealth: Payer: Self-pay

## 2017-12-20 VITALS — BP 133/52 | HR 63 | Temp 97.8°F | Resp 18 | Ht 67.25 in | Wt 150.5 lb

## 2017-12-20 DIAGNOSIS — Z9221 Personal history of antineoplastic chemotherapy: Secondary | ICD-10-CM | POA: Insufficient documentation

## 2017-12-20 DIAGNOSIS — Z1501 Genetic susceptibility to malignant neoplasm of breast: Secondary | ICD-10-CM | POA: Diagnosis not present

## 2017-12-20 DIAGNOSIS — Z1231 Encounter for screening mammogram for malignant neoplasm of breast: Secondary | ICD-10-CM

## 2017-12-20 DIAGNOSIS — Z86000 Personal history of in-situ neoplasm of breast: Secondary | ICD-10-CM | POA: Insufficient documentation

## 2017-12-20 DIAGNOSIS — M7989 Other specified soft tissue disorders: Secondary | ICD-10-CM

## 2017-12-20 DIAGNOSIS — Z9012 Acquired absence of left breast and nipple: Secondary | ICD-10-CM | POA: Diagnosis not present

## 2017-12-20 DIAGNOSIS — Z8543 Personal history of malignant neoplasm of ovary: Secondary | ICD-10-CM

## 2017-12-20 DIAGNOSIS — R635 Abnormal weight gain: Secondary | ICD-10-CM | POA: Insufficient documentation

## 2017-12-20 DIAGNOSIS — F0390 Unspecified dementia without behavioral disturbance: Secondary | ICD-10-CM | POA: Insufficient documentation

## 2017-12-20 DIAGNOSIS — Z1509 Genetic susceptibility to other malignant neoplasm: Secondary | ICD-10-CM

## 2017-12-20 LAB — COMPREHENSIVE METABOLIC PANEL
ALK PHOS: 88 U/L (ref 38–126)
ALT: 12 U/L (ref 0–44)
AST: 10 U/L — ABNORMAL LOW (ref 15–41)
Albumin: 3.4 g/dL — ABNORMAL LOW (ref 3.5–5.0)
Anion gap: 8 (ref 5–15)
BILIRUBIN TOTAL: 0.4 mg/dL (ref 0.3–1.2)
BUN: 11 mg/dL (ref 8–23)
CALCIUM: 9.1 mg/dL (ref 8.9–10.3)
CO2: 26 mmol/L (ref 22–32)
CREATININE: 0.88 mg/dL (ref 0.44–1.00)
Chloride: 106 mmol/L (ref 98–111)
GFR calc Af Amer: >60 mL/min (ref >60)
GFR calc non Af Amer: >60 mL/min (ref >60)
GLUCOSE: 147 mg/dL — AB (ref 70–99)
Potassium: 3.6 mmol/L (ref 3.5–5.1)
SODIUM: 140 mmol/L (ref 135–145)
TOTAL PROTEIN: 6.6 g/dL (ref 6.5–8.1)

## 2017-12-20 LAB — CBC WITH DIFFERENTIAL/PLATELET
Basophils Absolute: 0 10*3/uL (ref 0.0–0.1)
Basophils Relative: 1 %
EOS PCT: 4 %
Eosinophils Absolute: 0.2 10*3/uL (ref 0.0–0.5)
HEMATOCRIT: 40.4 % (ref 34.8–46.6)
HEMOGLOBIN: 13.5 g/dL (ref 11.6–15.9)
Lymphocytes Relative: 17 %
Lymphs Abs: 1 10*3/uL (ref 0.9–3.3)
MCH: 31.2 pg (ref 25.1–34.0)
MCHC: 33.4 g/dL (ref 31.5–36.0)
MCV: 93.3 fL (ref 79.5–101.0)
Monocytes Absolute: 0.5 10*3/uL (ref 0.1–0.9)
Monocytes Relative: 9 %
Neutro Abs: 3.9 10*3/uL (ref 1.5–6.5)
Neutrophils Relative %: 69 %
PLATELETS: 231 10*3/uL (ref 145–400)
RBC: 4.33 MIL/uL (ref 3.70–5.45)
RDW: 14.3 % (ref 11.2–14.5)
WBC: 5.6 10*3/uL (ref 3.9–10.3)

## 2017-12-20 NOTE — Telephone Encounter (Signed)
PRINTED AVS AND CALENDER OF UPCOMING APPOINTMENT. PER 7/29 LOS

## 2017-12-20 NOTE — Progress Notes (Signed)
[address]  [phone] [phone]  Clinic Follow up Note   Patient Care Team: Marletta Lor, MD as PCP - General   Date of Service:  12/20/2017  SUMMARY OF ONCOLOGIC HISTORY: 1. BRCA 1 + 2. Left breast cancer in 1992 3. 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 She was intolerant of tamoxifen. She has a subpectoral implant on right.   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 She had recurrence in March 2008, with surgery then by Dr Margot Chimes for 4 cm mass along colon and left pelvic node. (That pathology was ER PR negative.) Patient recalls that only symptom of recurrence was burning discomfort in LLQ. She had additional 6 cycles of taxol carboplatin from April - July 2008 and has had no known active disease since then. Per patient, ca 2729 marker was elevated with the recurrent ovarian cancer, with normal ca125; that information not available in this EMR.   CURRENT THERAPY: Surveillance    INTERVAL HISTORY:  [name] is here for a follow-up of ovarian cancer and left breast cancer. She was last seen by me 6 months ago. She presents to the clinic today by herself. She notes she is doing well. She recently returned from a 2 week cruise in [place]. She notes she has gained weight and b/l ankle swelling and feet since her trip.   She notes to still living alone and being able to take care of herself adequately.      REVIEW OF SYSTEMS:   Constitutional: Denies fevers, chills or abnormal weight loss (+) weight gain  Eyes: Denies  blurriness of vision Ears, nose, mouth, throat, and face: Denies mucositis or sore throat Respiratory: Denies cough, dyspnea or wheezes Cardiovascular: Denies palpitation, chest discomfort (+) moderate b/l ankle swelling Gastrointestinal:  Denies nausea, heartburn or change in bowel habits Skin: Denies abnormal skin rashes Lymphatics: Denies new lymphadenopathy or easy bruising Neurological:Denies numbness, tingling or new weaknesses (+) Dementia  Behavioral/Psych: Mood is stable, no new changes  All other systems were reviewed with the patient and are negative.  MEDICAL HISTORY:  Past Medical History:  Diagnosis Date  . DCIS (ductal carcinoma in situ) of breast   . Hypothyroidism   . Ovarian cancer [facility])    March 1998  . Unspecified vitamin D deficiency     SURGICAL HISTORY: Past Surgical History:  Procedure Laterality Date  . BIOPSY THYROID     for benign disease  . BREAST SURGERY    . CHOLECYSTECTOMY    . COLON SURGERY     colectomy for cancer  . colonscopy  2004   2/08  . left ankle fracture     status post surgery, March 2010  . MASTECTOMY  1992   left mastectomy  . PORT-A-CATH REMOVAL Right 04/15/2015   Procedure: REMOVAL PORT-A-CATH;  Surgeon: [name], MD;  Location: [place];  Service: General;  Laterality: Right;  Right upper chest  . TOTAL ABDOMINAL HYSTERECTOMY W/ BILATERAL SALPINGOOPHORECTOMY  1998   TAH for ovarian cancer    I have reviewed the social history and family history with the  patient and they are unchanged from previous note.  ALLERGIES:  is allergic to codeine sulfate and promethazine hcl.  MEDICATIONS:  Current Outpatient Medications  Medication Sig Dispense Refill  . donepezil (ARICEPT) 10 MG tablet Take 1 tablet (10 mg total) by mouth at bedtime. 90 tablet 3  . estradiol (ESTRACE) 1 MG tablet TAKE 1 TABLET EVERY DAY (Patient taking differently: TAKE HALF TABLET DAILY) 90 tablet 3  . levothyroxine (SYNTHROID,  LEVOTHROID) 88 MCG tablet Take 1 tablet (88 mcg total) by mouth daily. 90 tablet 5  . multivitamin (THERAGRAN) per tablet Take 1 tablet by mouth daily.      Marland Kitchen triamcinolone cream (KENALOG) 0.1 %      No current facility-administered medications for this visit.     PHYSICAL EXAMINATION: ECOG PERFORMANCE STATUS: 0 - Asymptomatic  Vitals:   12/20/17 1424  BP: (!) 133/52  Pulse: 63  Resp: 18  Temp: 97.8 F (36.6 C)  SpO2: 99%   Filed Weights   12/20/17 1424  Weight: 150 lb 8 oz (68.3 kg)    GENERAL:alert, no distress and comfortable SKIN: skin color, texture, turgor are normal, no rashes or significant lesions EYES: normal, Conjunctiva are pink and non-injected, sclera clear OROPHARYNX:no exudate, no erythema and lips, buccal mucosa, and tongue normal  NECK: supple, thyroid normal size, non-tender, without nodularity LYMPH:  no palpable lymphadenopathy in the cervical, axillary or inguinal LUNGS: clear to auscultation and percussion with normal breathing effort HEART: regular rate & rhythm and no murmurs and no lower extremity edema ABDOMEN:abdomen soft, non-tender and normal bowel sounds Musculoskeletal:no cyanosis of digits and no clubbing  NEURO: alert & oriented x 3 with fluent speech, no focal motor/sensory deficits Breast: (+) S/p left mastectomy and b/l reconstruction: Surgical incision in right breast and left breast incision with implant. Both show no palpable mass  LABORATORY DATA:  I have reviewed the data as listed CBC Latest Ref Rng & Units 12/20/2017 06/24/2017 12/17/2016  WBC 3.9 - 10.3 K/uL 5.6 6.8 5.6  Hemoglobin 11.6 - 15.9 g/dL 13.5 15.3 16.4(H)  Hematocrit 34.8 - 46.6 % 40.4 45.7 49.1(H)  Platelets 145 - 400 K/uL 231 221 203     CMP Latest Ref Rng & Units 12/20/2017 11/16/2017 06/24/2017  Glucose 70 - 99 mg/dL 147(H) 77 85  BUN 8 - 23 mg/dL '11 14 10  ' Creatinine 0.44 - 1.00 mg/dL 0.88 0.84 0.88  Sodium 135 - 145 mmol/L 140 139 138  Potassium 3.5 - 5.1  mmol/L 3.6 4.1 3.7  Chloride 98 - 111 mmol/L 106 102 104  CO2 22 - 32 mmol/L '26 28 24  ' Calcium 8.9 - 10.3 mg/dL 9.1 10.1 9.7  Total Protein 6.5 - 8.1 g/dL 6.6 7.2 7.7  Total Bilirubin 0.3 - 1.2 mg/dL 0.4 0.6 0.6  Alkaline Phos 38 - 126 U/L 88 94 103  AST 15 - 41 U/L 10(L) 11 8  ALT 0 - 44 U/L '12 16 11     ' Results for RASHIKA, BETTES (MRN [id number]) as of 12/20/2017 08:59  Ref. Range 10/28/2015 14:31 03/23/2016 14:49 12/17/2016 12:55 06/24/2017 14:27  CA 27.29 Latest Ref Range: <38 U/mL 31     CA 27.29 Latest Ref Range: 0.0 - 38.6 U/mL 27.7     CA 125 Latest Ref Range: <35 U/mL 8 7    Cancer Antigen (CA) 125 Latest Ref Range: 0.0 - 38.1 U/mL 9.2 9.2 9.4 11.1  PENDING for 12/20/17   RADIOGRAPHIC STUDIES: I have personally reviewed the radiological  images as listed and agreed with the findings in the report. No results found.    Screening Mammogram 03/15/17 IMPRESSION: No mammographic evidence of malignancy. A result letter of this screening mammogram will be mailed directly to the patient. RECOMMENDATION: Screening mammogram in one year.    MRI Breast Bilateral 04/06/16 IMPRESSION: No evidence of malignancy. RECOMMENDATION: Bilateral screening mammogram in 11 months when due.  Screening mammogram W/Implant 03/02/16 IMPRESSION: No mammographic evidence of malignancy. A result letter of this screening mammogram will be mailed directly to the patient. RECOMMENDATION: Screening mammogram in one year   ASSESSMENT & PLAN:  [name] has a past medical history of hypothyroidism and vitamin D deficiency.   1. History of Ovarian Carcinoma, recurrent, NED now  -I previously reviewed her history extensively, and confirmed the ley findings with patient.  -She initially diagnosed with stage IIIC in 1998 and pelvic recurrent 2008, s/u surgery and chemo  -She is on surveillance with yearly pelvic exams from her OB now. Last surveillance PET scan in 02/2012 showed no  evidence of disease. -She has been clinically doing very well, no complaints. Her CA 125 has been normal, no clinically concerning for recurrence. -Labs reviewed, her CBC is WNL and her CMP is still pending, I will call her for results  -f/u once a year. I encouraged her to contact the clinic if she develops any concerning symptoms for recurrence. She will f/u with new PCP in interim.    2. History of Left Breast DCIS -S/p left mastectomy and 13 node axillary dissection -S/p left breast reconstruction and right subpectoral implant -She is on breast surveillance with yearly mammograms, and breast exams with yearly follow ups.  -I previously suggested she separates her mammogram and MRI with 6 months apart. Due to her BRCA1 mutation, yearly screening MRI is recommended.   -From a breast cancer standpoint she is clinically doing well. Her physical exam and her 02/2017 mammogram were unremarkable. There is no clinical concern for recurrence. -Her 02/2017 mammogram showed no evidence of malignancy.  -Next Breast Mammogram in 02/2018 and MRI in 08/2018  3. BRCA 1+ -Pt is BRCA 1 mutation positive -Due to her advanced age, right prophylactic mastectomy was not recommended. -We will continue annual mammogram and breast MRI for close monitoring. -Previously, I again encouraged her to get her two daughters tested for BRCA 1 mutation. I discussed the importance and what can be done for them. They need regular mammograms and exams with their gynecologist. She previously stated she has told her daughters but they have not been tested    4. 20+ years on supplemental estrogen, estradiol -both Dr. Curlene Dolphin and I previously recommended stopping this medication, particularly given known risk factors for breast and gyn cancers. She decreased dose to half tablet but prefers to continue.   5. Dementia  -She has developed mild memory issue, currently on donepezil, she is functioning well at home. -She notes mild  issues with memory but no significant episodes have occurred.   6. B/l ankle swelling -She recently went on cruise to [place], she flew as well -She notes gaining weight as well -I advised her to elevate her feet when sitting and recommend using compression socks.  -If this worsened or be comes painful I encouraged her to contact the clinic  PLAN:  -Lab and f/u in 1 year  -right breast Mammogram in 02/2018 and MRI in 08/2018 for screening   No orders of the defined types were placed in this encounter.  All questions were answered. The patient knows to call the clinic with any problems, questions or concerns. No barriers to learning was detected.  I spent 20 minutes counseling the patient face to face. The total time spent in the appointment was 25 minutes and more than 50% was on counseling and review of test results  I, Joslyn Devon, am acting as scribe for Truitt Merle, MD.   I have reviewed the above documentation for accuracy and completeness, and I agree with the above.      Truitt Merle, MD 12/20/2017

## 2017-12-21 LAB — CA 125: CANCER ANTIGEN (CA) 125: 7.7 U/mL (ref 0.0–38.1)

## 2017-12-27 ENCOUNTER — Telehealth: Payer: Self-pay

## 2017-12-27 NOTE — Telephone Encounter (Signed)
-----   Message from Truitt Merle, MD sent at 12/26/2017  4:40 PM EDT ----- Please let pt know the tumor marker CA125 was normal  On her recent visit, thanks  Truitt Merle  12/26/2017

## 2017-12-27 NOTE — Telephone Encounter (Signed)
Spoke with patient per Dr. Burr Medico notified her that the tumor marker results came back normal.  Patient verbalized an understanding.

## 2018-01-10 DIAGNOSIS — L814 Other melanin hyperpigmentation: Secondary | ICD-10-CM | POA: Diagnosis not present

## 2018-01-10 DIAGNOSIS — Z85828 Personal history of other malignant neoplasm of skin: Secondary | ICD-10-CM | POA: Diagnosis not present

## 2018-01-10 DIAGNOSIS — L853 Xerosis cutis: Secondary | ICD-10-CM | POA: Diagnosis not present

## 2018-01-10 DIAGNOSIS — L821 Other seborrheic keratosis: Secondary | ICD-10-CM | POA: Diagnosis not present

## 2018-01-10 DIAGNOSIS — D1801 Hemangioma of skin and subcutaneous tissue: Secondary | ICD-10-CM | POA: Diagnosis not present

## 2018-02-07 ENCOUNTER — Other Ambulatory Visit: Payer: Self-pay | Admitting: Internal Medicine

## 2018-03-16 ENCOUNTER — Ambulatory Visit
Admission: RE | Admit: 2018-03-16 | Discharge: 2018-03-16 | Disposition: A | Discharge status: Home / Self Care | Payer: Medicare Other | Source: Ambulatory Visit | Attending: Hematology | Admitting: Hematology

## 2018-03-16 DIAGNOSIS — Z1231 Encounter for screening mammogram for malignant neoplasm of breast: Secondary | ICD-10-CM | POA: Diagnosis not present

## 2018-07-25 ENCOUNTER — Encounter (HOSPITAL_COMMUNITY): Payer: Self-pay | Admitting: Emergency Medicine

## 2018-07-25 ENCOUNTER — Emergency Department (HOSPITAL_COMMUNITY): Payer: Medicare HMO

## 2018-07-25 ENCOUNTER — Other Ambulatory Visit: Payer: Self-pay

## 2018-07-25 ENCOUNTER — Emergency Department (HOSPITAL_COMMUNITY)
Admission: EM | Admit: 2018-07-25 | Discharge: 2018-07-25 | Discharge status: Against Medical Advice | Payer: Medicare HMO | Attending: Emergency Medicine | Admitting: Emergency Medicine

## 2018-07-25 DIAGNOSIS — S0003XA Contusion of scalp, initial encounter: Secondary | ICD-10-CM | POA: Diagnosis not present

## 2018-07-25 DIAGNOSIS — Z79899 Other long term (current) drug therapy: Secondary | ICD-10-CM | POA: Diagnosis not present

## 2018-07-25 DIAGNOSIS — E039 Hypothyroidism, unspecified: Secondary | ICD-10-CM | POA: Diagnosis not present

## 2018-07-25 DIAGNOSIS — Z853 Personal history of malignant neoplasm of breast: Secondary | ICD-10-CM | POA: Insufficient documentation

## 2018-07-25 DIAGNOSIS — Z9012 Acquired absence of left breast and nipple: Secondary | ICD-10-CM | POA: Insufficient documentation

## 2018-07-25 DIAGNOSIS — R55 Syncope and collapse: Secondary | ICD-10-CM

## 2018-07-25 DIAGNOSIS — Z8543 Personal history of malignant neoplasm of ovary: Secondary | ICD-10-CM | POA: Diagnosis not present

## 2018-07-25 DIAGNOSIS — Z87891 Personal history of nicotine dependence: Secondary | ICD-10-CM | POA: Diagnosis not present

## 2018-07-25 DIAGNOSIS — I959 Hypotension, unspecified: Secondary | ICD-10-CM | POA: Diagnosis not present

## 2018-07-25 LAB — CBC
HEMATOCRIT: 53.1 % — AB (ref 36.0–46.0)
HEMOGLOBIN: 16.5 g/dL — AB (ref 12.0–15.0)
MCH: 30.4 pg (ref 26.0–34.0)
MCHC: 31.1 g/dL (ref 30.0–36.0)
MCV: 98 fL (ref 80.0–100.0)
NRBC: 0 % (ref 0.0–0.2)
Platelets: 198 10*3/uL (ref 150–400)
RBC: 5.42 MIL/uL — AB (ref 3.87–5.11)
RDW: 12.1 % (ref 11.5–15.5)
WBC: 10.8 10*3/uL — AB (ref 4.0–10.5)

## 2018-07-25 LAB — BASIC METABOLIC PANEL
ANION GAP: 11 (ref 5–15)
BUN: 13 mg/dL (ref 8–23)
CHLORIDE: 107 mmol/L (ref 98–111)
CO2: 20 mmol/L — ABNORMAL LOW (ref 22–32)
Calcium: 8.9 mg/dL (ref 8.9–10.3)
Creatinine, Ser: 0.96 mg/dL (ref 0.44–1.00)
GFR, EST NON AFRICAN AMERICAN: 57 mL/min — AB (ref >60)
Glucose, Bld: 103 mg/dL — ABNORMAL HIGH (ref 70–99)
POTASSIUM: 3.9 mmol/L (ref 3.5–5.1)
Sodium: 138 mmol/L (ref 135–145)

## 2018-07-25 LAB — CBG MONITORING, ED: Glucose-Capillary: 92 mg/dL (ref 70–99)

## 2018-07-25 LAB — I-STAT TROPONIN, ED: TROPONIN I, POC: 0 ng/mL (ref 0.00–0.08)

## 2018-07-25 MED ORDER — SODIUM CHLORIDE 0.9 % IV BOLUS
1000.0000 mL | Freq: Once | INTRAVENOUS | Status: AC
  Administered 2018-07-25: 1000 mL via INTRAVENOUS

## 2018-07-25 MED ORDER — SODIUM CHLORIDE 0.9% FLUSH
3.0000 mL | Freq: Once | INTRAVENOUS | Status: AC
  Administered 2018-07-25: 3 mL via INTRAVENOUS

## 2018-07-25 NOTE — ED Notes (Signed)
Pt refused pelvic X-ray, MD Zenia Resides aware.

## 2018-07-25 NOTE — ED Notes (Signed)
Family at bedside and updated

## 2018-07-25 NOTE — ED Notes (Signed)
Pt given water and [place] sandwich per MD Sabra Heck request.

## 2018-07-25 NOTE — ED Notes (Signed)
Patient transported to CT 

## 2018-07-25 NOTE — ED Provider Notes (Signed)
[facility] [place] EMERGENCY DEPARTMENT Provider Note   CSN: [id number] Arrival date & time: 07/25/18  1830    History   Chief Complaint No chief complaint on file.   HPI [name] is a 78 y.o. female.      Loss of Consciousness  Episode history:  Multiple Most recent episode:  Today Timing:  Intermittent Progression:  Resolved Chronicity:  New Context: normal activity   Context: not medication change, not standing up and not urination   Witnessed: yes   Relieved by:  None tried Worsened by:  Nothing Ineffective treatments:  None tried Associated symptoms: dizziness   Associated symptoms: no chest pain, no fever, no focal weakness, no headaches, no malaise/fatigue, no palpitations, no seizures, no shortness of breath, no visual change, no vomiting and no weakness   Risk factors: no congenital heart disease     Past Medical History:  Diagnosis Date  . DCIS (ductal carcinoma in situ) of breast   . Ovarian cancer [facility])    March 1998  . Unspecified vitamin D deficiency     Patient Active Problem List   Diagnosis Date Noted  . Memory impairment 01/05/2017  . 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 Hx of ovarian cancer 10/29/2015  . Breast cancer screening, high risk patient 10/29/2015  . Vasomotor instability 09/16/2014  . History of ovarian cancer 09/16/2014  . Screening mammogram for high-risk patient 09/16/2014  . BRCA1 positive 03/21/2014  . Vitamin D deficiency   . Peripheral neuropathy 09/06/2012  . Hypothyroidism 05/11/2011  . ADENOCARCINOMA, BREAST, HX OF 02/29/2008  . NEOPLASM, MALIGNANT, OVARY, HX OF 02/29/2008  . ABDOMINAL PAIN, GENERALIZED 06/23/2007    Past Surgical History:  Procedure Laterality Date  . BIOPSY THYROID     for benign disease  . BREAST SURGERY    . CHOLECYSTECTOMY    . COLON SURGERY     colectomy for cancer  . colonscopy  2004   2/08  . left ankle fracture     status post  surgery, March 2010  . MASTECTOMY  1992   left mastectomy  . PORT-A-CATH REMOVAL Right 04/15/2015   Procedure: REMOVAL PORT-A-CATH;  Surgeon: [name], MD;  Location: [facility];  Service: General;  Laterality: Right;  Right upper chest  . TOTAL ABDOMINAL HYSTERECTOMY W/ BILATERAL SALPINGOOPHORECTOMY  1998   TAH for ovarian cancer     OB History   No obstetric history on file.      Home Medications    Prior to Admission medications   Medication Sig Start Date End Date Taking? Authorizing Provider  Aspirin-Acetaminophen-Caffeine (EXCEDRIN MIGRAINE PO) Take 1 tablet by mouth daily as needed (headache).   Yes [provider]  donepezil (ARICEPT) 10 MG tablet TAKE ONE TABLET BY MOUTH EVERY NIGHT AT BEDTIME Patient taking differently: Take 10 mg by mouth at bedtime.  02/08/18  Yes Marletta Lor, MD  estradiol (ESTRACE) 1 MG tablet TAKE 1 TABLET EVERY DAY Patient taking differently: Take 0.5 mg by mouth at bedtime.  08/14/16  Yes Marletta Lor, MD  levothyroxine (SYNTHROID, LEVOTHROID) 88 MCG tablet Take 1 tablet (88 mcg total) by mouth daily. Patient taking differently: Take 88 mcg by mouth at bedtime.  11/16/17  Yes Marletta Lor, MD  Multiple Vitamin (MULTIVITAMIN WITH MINERALS) TABS tablet Take 1 tablet by mouth at bedtime.   Yes [provider]  OVER THE COUNTER MEDICATION Apply 1 application topically at bedtime  as needed (foot burning/neuropathy). Over the counter cream for neuropathy   Yes [provider]  Polyvinyl Alcohol-Povidone (REFRESH OP) Place 1 drop into both eyes daily as needed (dry eyes/itching).   Yes [provider]    Family History Family History  Problem Relation Age of Onset  . Cancer Mother        [name], historyof melanoma   . Aneurysm Father        abdominal aortic  . Heart disease Father   . Diverticulitis Brother   . Breast cancer Maternal Aunt     Social History Social  History   Tobacco Use  . Smoking status: Former Smoker    Last attempt to quit: 05/26/1991    Years since quitting: 27.1  . Smokeless tobacco: Never Used  Substance Use Topics  . Alcohol use: Yes    Comment: social  . Drug use: No     Allergies   Codeine sulfate and Promethazine hcl   Review of Systems Review of Systems  Constitutional: Negative for chills, fever and malaise/fatigue.  HENT: Negative for ear pain and sore throat.   Eyes: Negative for pain and visual disturbance.  Respiratory: Negative for cough and shortness of breath.   Cardiovascular: Positive for syncope. Negative for chest pain and palpitations.  Gastrointestinal: Negative for abdominal pain and vomiting.  Genitourinary: Negative for dysuria and hematuria.  Musculoskeletal: Negative for arthralgias and back pain.  Skin: Negative for color change and rash.  Neurological: Positive for dizziness. Negative for focal weakness, seizures, syncope, weakness and headaches.  All other systems reviewed and are negative.    Physical Exam Updated Vital Signs BP 121/66   Pulse 65   Temp (!) 97.4 F (36.3 C) (Oral)   Resp 15   Ht 5' 7" (1.702 m)   SpO2 99%   BMI 23.57 kg/m   Physical Exam Vitals signs and nursing note reviewed.  Constitutional:      General: She is not in acute distress.    Appearance: She is well-developed.     Comments: Patient resting comfortably, no acute distress  HENT:     Head: Normocephalic and atraumatic.  Eyes:     Extraocular Movements: Extraocular movements intact.     Conjunctiva/sclera: Conjunctivae normal.     Pupils: Pupils are equal, round, and reactive to light.  Neck:     Musculoskeletal: Normal range of motion and neck supple. No neck rigidity or muscular tenderness.     Comments: No C-spine tenderness, full range without pain Cardiovascular:     Rate and Rhythm: Normal rate and regular rhythm.     Heart sounds: No murmur.  Pulmonary:     Effort: Pulmonary effort  is normal. No respiratory distress.     Breath sounds: Normal breath sounds.  Abdominal:     Palpations: Abdomen is soft.     Tenderness: There is no abdominal tenderness.  Musculoskeletal: Normal range of motion.        General: No swelling, tenderness or signs of injury.     Comments: No bony tenderness in the T, L-spinel.  Skin:    General: Skin is warm and dry.     Capillary Refill: Capillary refill takes less than 2 seconds.  Neurological:     General: No focal deficit present.     Mental Status: She is alert and oriented to person, place, and time. Mental status is at baseline.     Cranial Nerves: No cranial nerve deficit.  Sensory: No sensory deficit.     Motor: No weakness.     Coordination: Coordination normal.     Gait: Gait normal.     Deep Tendon Reflexes: Reflexes normal.     Comments: No neurological deficits  Psychiatric:        Mood and Affect: Mood normal.      ED Treatments / Results  Labs (all labs ordered are listed, but only abnormal results are displayed) Labs Reviewed  BASIC METABOLIC PANEL - Abnormal; Notable for the following components:      Result Value   CO2 20 (*)    Glucose, Bld 103 (*)    GFR calc non Af Amer 57 (*)    All other components within normal limits  CBC - Abnormal; Notable for the following components:   WBC 10.8 (*)    RBC 5.42 (*)    Hemoglobin 16.5 (*)    HCT 53.1 (*)    All other components within normal limits  URINALYSIS, ROUTINE W REFLEX MICROSCOPIC  CBG MONITORING, ED  CBG MONITORING, ED  I-STAT TROPONIN, ED    EKG EKG Interpretation  Date/Time:  Monday July 25 2018 18:31:33 EST Ventricular Rate:  59 PR Interval:    QRS Duration: 101 QT Interval:  461 QTC Calculation: 457 R Axis:   24 Text Interpretation:  Sinus rhythm Anteroseptal infarct, age indeterminate Minimal ST elevation, inferior leads Confirmed by Lacretia Leigh ([id number]) on 07/25/2018 7:35:00 PM   Radiology Ct Head Wo Contrast  Result Date:  07/25/2018 CLINICAL DATA:  Multiple syncopal episodes. Occipital hematoma. EXAM: CT HEAD WITHOUT CONTRAST TECHNIQUE: Contiguous axial images were obtained from the base of the skull through the vertex without intravenous contrast. COMPARISON:  06/27/2012 FINDINGS: Brain: Age related involutional changes of the brain with chronic microvascular ischemia. No acute intracranial hemorrhage, midline shift or edema. No hydrocephalus. No extra-axial fluid collections. Vascular: No hyperdense vessels or unexpected calcifications. Skull: Intact without acute skull fracture. Sinuses/Orbits: Intact without acute abnormalities. Other: Large right parieto-occipital scalp hematoma. IMPRESSION: 1. Large right parieto-occipital scalp hematoma without underlying skull fracture. 2. 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 No acute intracranial abnormality. Electronically Signed   By: Ashley Royalty M.D.   On: 07/25/2018 20:42    Procedures Procedures (including critical care time)  Medications Ordered in ED Medications  sodium chloride flush (NS) 0.9 % injection 3 mL (3 mLs Intravenous Given 07/25/18 1858)  sodium chloride 0.9 % bolus 1,000 mL (0 mLs Intravenous Stopped 07/25/18 2158)     Initial Impression / Assessment and Plan / ED Course  I have reviewed the triage vital signs and the nursing notes.  Pertinent labs & imaging results that were available during my care of the patient were reviewed by me and considered in my medical decision making (see chart for details).        78 year old female significant past medical history listed above who presents with 2 syncopal episodes back to back while shopping.  Patient endorses some mild dizziness prior to the episode however denies any palpitations, chest pain, shortness of breath.  No seizure activity was described during the episodes which were witnessed 1 of which was witnessed by EMS.  Patient on arrival hemodynamically stable, normal systolic blood  pressures around 95 which the patient is currently at.  Possible cardiac etiology, vasovagal.  Will obtain laboratory studies including troponin, head CT.  Laboratory studies and CT imaging indicate chronic microvascular changes, leukocytosis, negative troponin.  EKG shows normal sinus rhythm,  T wave inversion V1, V2, no ST elevation or depression, appropriate intervals, no Brugada, no WPW.  Discussed with patient that based on her 2 episodes of syncope he was needed for her to be admitted to the hospital for echo as well as telemetry.  I discussed with laboratory studies as well as the T wave inversions in the EKG, told the patient that it likely that the patient would have another syncopal episode which could be life-threatening if she left the emergency department.  Patient and accompanying family had full understanding and indicated that she would follow-up with primary care provider.  Patient also declined pelvic x-ray, this was ordered due to some mild pain in the coccyx area on exam.  Pelvis stable.  Both myself and attending discussed with the patient regarding staying however after multiple discussions she signed out AMA.  Patient told that anytime she can return to the emergency department for admission.  And to call 911 if she feels any symptoms.  Patient left AGAINST MEDICAL ADVICE.  The above care was discussed and agreed upon by my attending physician.  Final Clinical Impressions(s) / ED Diagnoses   Final diagnoses:  Syncope, unspecified syncope type    ED Discharge Orders    None       Orson Aloe, MD 07/25/18 2200    Lacretia Leigh, MD 07/25/18 2257

## 2018-07-25 NOTE — ED Triage Notes (Signed)
Patient arrived via [facility], from a grocery store with witnessed multiple syncopal episodes. Patient did not want to come to the ED. While fire department was trying to convince patient to come to the ED, patient was reported to have another episode. She has a lump on the back of her head from the falls, refused C-collar per EMS. On arrival, patient is A&O X4. GCS- 15

## 2018-07-25 NOTE — ED Provider Notes (Signed)
I saw and evaluated the patient, reviewed the resident's note and I agree with the findings and plan.  EKG: EKG Interpretation  Date/Time:  Monday July 25 2018 18:31:33 EST Ventricular Rate:  59 PR Interval:    QRS Duration: 101 QT Interval:  461 QTC Calculation: 457 R Axis:   24 Text Interpretation:  Sinus rhythm Anteroseptal infarct, age indeterminate Minimal ST elevation, inferior leads Confirmed by Lacretia Leigh ([id number]) on 07/25/2018 7:7:16 PM 78 year old female here after having multiple syncopal events.  She relates this to not eating or drinking today.  Denies any chest pain or headache prior to the event.  Perform labs and will likely recommend admission.  Patient stated that she is likely to not want to be admitted and at that point will likely have patient sign out AMA and follow-up with her doctor.   Lacretia Leigh, MD 07/25/18 Karl Bales

## 2018-07-25 NOTE — ED Notes (Addendum)
Pt leaving AMA, MD Zenia Resides aware. Pt verbalized understanding. Pt stable and ambulatory, leaving with family.

## 2018-07-25 NOTE — ED Notes (Signed)
RN sent 2 visitors to Pt's Rm 

## 2018-07-25 NOTE — Discharge Instructions (Signed)
Discussed at length with the patient was leaving [place] and that she could possibly syncopized again and have a life-threatening condition by not staying overnight in the hospital for evaluation.  Patient in full understanding in the presence of her family.  Patient indicated that she will sign out AMA.  Discussed with patient that she can return to the emergency department anytime for admission and evaluation if symptoms present or change.  Patient is in follow-up with primary care provider.

## 2018-08-02 ENCOUNTER — Telehealth: Payer: Self-pay

## 2018-08-02 NOTE — Telephone Encounter (Signed)
RN informed that the below provider is no longer with organization. Will discuss with Dr. Deborra Medina.

## 2018-08-02 NOTE — Telephone Encounter (Signed)
Patient has not yet established care with me so I am not sure how we need to go about handling this.  I will route to [place] for their input.

## 2018-08-02 NOTE — Telephone Encounter (Signed)
FYI Daughter wants to make sure Dr. Deborra Medina aware that the pt went to the ED for fall twice within 20 min. She also mention pt left without medical advise from the ED. She is concern about this because pt believed it was her sugar was low but it was normal when EMT checked ( inverse t wave was mention by EMT). Daughter also mention that Mrs. Donath memory is getting bad and sometime not tell the doctor everything that might be going.   FYI--pt has an appt to transfer from Dr. Raliegh Ip to Dr. Deborra Medina on 08/04/2018.

## 2018-08-02 NOTE — Telephone Encounter (Signed)
Per notes below the patient would like to transfer her care to Dr. Deborra Medina at [facility]. Also, patient was recently seen in the ED and the daughter voices some concerns.  Dr. Burnice Logan - Please advise. Thanks.

## 2018-08-02 NOTE — Telephone Encounter (Signed)
Copied from [place] [id number]. Topic: General - Other >> Aug 01, 2018  5:33 PM Jeri Cos wrote: Reason for CRM: Daughter of pt called wanting to speak with the nurse for Dr. Deborra Medina. Daughter understands that she cannot be given any information about the pt until DPR is signed. Daughter wants to give information about her mother because she is elderly and can be difficult to deal with at times.

## 2018-08-03 ENCOUNTER — Encounter: Payer: Medicare Other | Admitting: Family Medicine

## 2018-08-04 ENCOUNTER — Other Ambulatory Visit: Payer: Self-pay

## 2018-08-04 ENCOUNTER — Ambulatory Visit (INDEPENDENT_AMBULATORY_CARE_PROVIDER_SITE_OTHER): Payer: Medicare HMO | Admitting: Family Medicine

## 2018-08-04 ENCOUNTER — Encounter: Payer: Self-pay | Admitting: Family Medicine

## 2018-08-04 VITALS — BP 92/64 | HR 66 | Temp 97.9°F | Ht 67.0 in | Wt 144.0 lb

## 2018-08-04 DIAGNOSIS — R55 Syncope and collapse: Secondary | ICD-10-CM

## 2018-08-04 DIAGNOSIS — R413 Other amnesia: Secondary | ICD-10-CM | POA: Diagnosis not present

## 2018-08-04 DIAGNOSIS — Z8543 Personal history of malignant neoplasm of ovary: Secondary | ICD-10-CM

## 2018-08-04 DIAGNOSIS — Z86000 Personal history of in-situ neoplasm of breast: Secondary | ICD-10-CM | POA: Diagnosis not present

## 2018-08-04 DIAGNOSIS — E039 Hypothyroidism, unspecified: Secondary | ICD-10-CM | POA: Diagnosis not present

## 2018-08-04 HISTORY — DX: Syncope and collapse: R55

## 2018-08-04 LAB — COMPREHENSIVE METABOLIC PANEL
ALT: 12 U/L (ref 0–35)
AST: 10 U/L (ref 0–37)
Albumin: 3.7 g/dL (ref 3.5–5.2)
Alkaline Phosphatase: 95 U/L (ref 39–117)
BUN: 17 mg/dL (ref 6–23)
CALCIUM: 9.5 mg/dL (ref 8.4–10.5)
CHLORIDE: 104 meq/L (ref 96–112)
CO2: 28 meq/L (ref 19–32)
Creatinine, Ser: 0.93 mg/dL (ref 0.40–1.20)
GFR: 58.16 mL/min — AB (ref >60.00)
Glucose, Bld: 77 mg/dL (ref 70–99)
Potassium: 3.7 mEq/L (ref 3.5–5.1)
Sodium: 140 mEq/L (ref 135–145)
Total Bilirubin: 0.4 mg/dL (ref 0.2–1.2)
Total Protein: 6.8 g/dL (ref 6.0–8.3)

## 2018-08-04 LAB — CBC WITH DIFFERENTIAL/PLATELET
Basophils Absolute: 0 10*3/uL (ref 0.0–0.1)
Basophils Relative: 1 % (ref 0.0–3.0)
EOS ABS: 0.1 10*3/uL (ref 0.0–0.7)
Eosinophils Relative: 2.9 % (ref 0.0–5.0)
HEMATOCRIT: 44.4 % (ref 36.0–46.0)
Hemoglobin: 15 g/dL (ref 12.0–15.0)
LYMPHS PCT: 28.9 % (ref 12.0–46.0)
Lymphs Abs: 1.4 10*3/uL (ref 0.7–4.0)
MCHC: 33.8 g/dL (ref 30.0–36.0)
MCV: 92.4 fl (ref 78.0–100.0)
Monocytes Absolute: 0.5 10*3/uL (ref 0.1–1.0)
Monocytes Relative: 11 % (ref 3.0–12.0)
NEUTROS ABS: 2.7 10*3/uL (ref 1.4–7.7)
Neutrophils Relative %: 56.2 % (ref 43.0–77.0)
PLATELETS: 287 10*3/uL (ref 150.0–400.0)
RBC: 4.8 Mil/uL (ref 3.87–5.11)
RDW: 12.6 % (ref 11.5–15.5)
WBC: 4.9 10*3/uL (ref 4.0–10.5)

## 2018-08-04 LAB — VITAMIN B12: Vitamin B-12: 391 pg/mL (ref 211–911)

## 2018-08-04 LAB — T4, FREE: Free T4: 1.1 ng/dL (ref 0.60–1.60)

## 2018-08-04 LAB — TSH: TSH: 1.7 u[IU]/mL (ref 0.35–4.50)

## 2018-08-04 NOTE — Assessment & Plan Note (Signed)
Overdue for thyroid panel.  Check labs today.  Could certainly be a contributing to factor in what could cause syncope. The patient indicates understanding of these issues and agrees with the plan.

## 2018-08-04 NOTE — Patient Instructions (Addendum)
Great to see you. I will call you with your lab results from today and you can view them online.   Someone will call you with a neurology referral.

## 2018-08-04 NOTE — Assessment & Plan Note (Signed)
Two episodes on same day.   LIkely multifactorial and patient is new to me.  Will start with labs, including UA to rule out UTI.   Orders Placed This Encounter  Procedures  . CBC with Differential/Platelet  . Comprehensive metabolic panel  . B12  . TSH  . T4, free  . Urinalysis, Routine w reflex microscopic  . Ambulatory referral to Neurology

## 2018-08-04 NOTE — Progress Notes (Signed)
Subjective:   Patient ID: [name], female    DOB: 1941-03-29, 78 y.o.   MRN: [id number]  [name] is a pleasant 78 y.o. year old female who presents to clinic today with Transitions Of Care (Patient is here today to transfer care from Dr. Burnice Logan to Dr. Deborra Medina. She is needing a BMD scan and she uses [facility].  She was seen at [facility] for Syncopal episode on 3.2.20.  She signed out AMA although they wanted to keep her. They did EKG, CT)  on 08/04/2018  HPI:  Here with her husband to transfer care from Dr. Raliegh Ip.  Was seen in the ED on 07/25/18 but left AMA before complete evaluation was performed.  Notes reviewed.  She was seen for a syncopal episode x 2- according to patient and her husband she passed out once during the late afternoon, hit her head, stood up and passed out again.  She feels she just didn't eat enough that day.   TYpically doesn't have syncopal episodes per pt and her husband.  EKG was done along with head CT prior to her leaving AMA.  CT did show large scalp hematoma without a skull fracture.   She has had no further syncopal episodes . Ct Head Wo Contrast  Result Date: 07/25/2018 CLINICAL DATA:  Multiple syncopal episodes. Occipital hematoma. EXAM: CT HEAD WITHOUT CONTRAST TECHNIQUE: Contiguous axial images were obtained from the base of the skull through the vertex without intravenous contrast. COMPARISON:  06/27/2012 FINDINGS: Brain: Age related involutional changes of the brain with chronic microvascular ischemia. No acute intracranial hemorrhage, midline shift or edema. No hydrocephalus. No extra-axial fluid collections. Vascular: No hyperdense vessels or unexpected calcifications. Skull: Intact without acute skull fracture. Sinuses/Orbits: Intact without acute abnormalities. Other: Large right parieto-occipital scalp hematoma. IMPRESSION: 1. Large right parieto-occipital scalp hematoma without underlying skull fracture. 2. 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 No acute intracranial abnormality. Electronically Signed   By: Ashley Royalty M.D.   On: 07/25/2018 20:42    Assessment and Plan was as follows since she left AMA:   Date/Time:                  Monday July 25 2018 18:31:33 EST Ventricular Rate:         59 PR Interval:                   QRS Duration: 101 QT Interval:                 461 QTC Calculation:        457 R Axis:                         24 Text Interpretation:       Sinus rhythm Anteroseptal infarct, age indeterminate Minimal ST elevation, inferior leads Confirmed by Lacretia Leigh ([id number]) on 07/25/2018 7:68:79 PM 78 year old female here after having multiple syncopal events.  She relates this to not eating or drinking today.  Denies any chest pain or headache prior to the event.  Perform labs and will likely recommend admission.  Patient stated that she is likely to not want to be admitted and at that point will likely have patient sign out AMA and follow-up with her doctor.   Lacretia Leigh, MD 07/25/18 1955  Lab Results  Component Value Date   WBC 10.8 (H) 07/25/2018   HGB 16.5 (H) 07/25/2018   HCT  53.1 (H) 07/25/2018   MCV 98.0 07/25/2018   PLT 198 07/25/2018   Lab Results  Component Value Date   CREATININE 0.96 07/25/2018    hypothyroidism- Has been taking synthroid 88 mcg daily.  Thyroid function has not bee monitored recently per pt. Lab Results  Component Value Date   TSH 7.15 (H) 11/16/2017   History of ovarian cancer and left DCIS- followed by Dr. Burr Medico.  Notes reviewed.  ? Dementia- she has been on aricept 10 mg daily for years.  In reviewing chart, it appears Dr. Raliegh Ip started her on this on 01/05/17.  At that [place] she could not recall 3 words either. Family feels that she has short term memory issues. Husband states she has never forgotten anything major like where she lives or people she knows.  She cannot recall words when asked to repeat them minutes later. Recent head CT does show marked  chronic microvascular changes.    Current Outpatient Medications on File Prior to Visit  Medication Sig Dispense Refill  . Aspirin-Acetaminophen-Caffeine (EXCEDRIN MIGRAINE PO) Take 1 tablet by mouth daily as needed (headache).    . donepezil (ARICEPT) 10 MG tablet TAKE ONE TABLET BY MOUTH EVERY NIGHT AT BEDTIME (Patient taking differently: Take 10 mg by mouth at bedtime. ) 90 tablet 3  . estradiol (ESTRACE) 1 MG tablet TAKE 1 TABLET EVERY DAY (Patient taking differently: Take 0.5 mg by mouth at bedtime. ) 90 tablet 3  . levothyroxine (SYNTHROID, LEVOTHROID) 88 MCG tablet Take 1 tablet (88 mcg total) by mouth daily. (Patient taking differently: Take 88 mcg by mouth at bedtime. ) 90 tablet 5  . Multiple Vitamin (MULTIVITAMIN WITH MINERALS) TABS tablet Take 1 tablet by mouth at bedtime.    Marland Kitchen OVER THE COUNTER MEDICATION Apply 1 application topically at bedtime as needed (foot burning/neuropathy). Over the counter cream for neuropathy    . Polyvinyl Alcohol-Povidone (REFRESH OP) Place 1 drop into both eyes daily as needed (dry eyes/itching).     No current facility-administered medications on file prior to visit.     Allergies  Allergen Reactions  . Codeine Sulfate Nausea And Vomiting    Projectile vomiting  . Promethazine Hcl Other (See Comments)    Unknown reaction    Past Medical History:  Diagnosis Date  . DCIS (ductal carcinoma in situ) of breast   . Ovarian cancer [facility])    March 1998  . Unspecified vitamin D deficiency     Past Surgical History:  Procedure Laterality Date  . BIOPSY THYROID     for benign disease  . BREAST SURGERY    . CHOLECYSTECTOMY    . COLON SURGERY     colectomy for cancer  . colonscopy  2004   2/08  . left ankle fracture     status post surgery, March 2010  . MASTECTOMY  1992   left mastectomy  . PORT-A-CATH REMOVAL Right 04/15/2015   Procedure: REMOVAL PORT-A-CATH;  Surgeon: [name], MD;  Location: [place];  Service:  General;  Laterality: Right;  Right upper chest  . TOTAL ABDOMINAL HYSTERECTOMY W/ BILATERAL SALPINGOOPHORECTOMY  1998   TAH for ovarian cancer    Family History  Problem Relation Age of Onset  . Cancer Mother        [name], historyof melanoma   . Aneurysm Father        abdominal aortic  . Heart disease Father   . Diverticulitis Brother   . Breast cancer Maternal Aunt  Social History   Socioeconomic History  . Marital status: Divorced    Spouse name: Not on file  . Number of children: Not on file  . Years of education: Not on file  . Highest education level: Not on file  Occupational History  . Not on file  Social Needs  . Financial resource strain: Not on file  . Food insecurity:    Worry: Not on file    Inability: Not on file  . Transportation needs:    Medical: Not on file    Non-medical: Not on file  Tobacco Use  . Smoking status: Former Smoker    Last attempt to quit: 05/26/1991    Years since quitting: 27.2  . Smokeless tobacco: Never Used  Substance and Sexual Activity  . Alcohol use: Yes    Comment: social  . Drug use: No  . Sexual activity: Not on file  Lifestyle  . Physical activity:    Days per week: Not on file    Minutes per session: Not on file  . Stress: Not on file  Relationships  . Social connections:    Talks on phone: Not on file    Gets together: Not on file    Attends religious service: Not on file    Active member of club or organization: Not on file    Attends meetings of clubs or organizations: Not on file    Relationship status: Not on file  . Intimate partner violence:    Fear of current or ex partner: Not on file    Emotionally abused: Not on file    Physically abused: Not on file    Forced sexual activity: Not on file  Other Topics Concern  . Not on file  Social History Narrative   March 1998- history of stage III ovarian cancer; status post suboptimal debulking; status post 6 cycles of Taxoil and carboplatin with complete  remission documented by second look surgery September 1998.      Recurrent ovarian cancer, status post 6 cycles carbo and Taxol, April 1998 until July 2008      1992, left breast intraductal carcinoma (DCIS)   Status post left modified radial mastectomy and with silicon implant   13 of 13 lymph modes negative; positive for BCRA1 gene   Recurrent ovarian carinoma, February 2008   Admit Feb. 2009 partial small bowel obstruction treated medically   The PMH, PSH, Social History, Family History, Medications, and allergies have been reviewed in [facility], and have been updated if relevant.   Review of Systems  Constitutional: Negative.   HENT: Negative.   Respiratory: Negative.   Genitourinary: Negative.   Neurological: Negative for dizziness, tremors, seizures, syncope, facial asymmetry, speech difficulty, weakness, light-headedness, numbness and headaches.  Psychiatric/Behavioral: Positive for decreased concentration. Negative for agitation, behavioral problems, confusion, dysphoric mood, hallucinations, self-injury, sleep disturbance and suicidal ideas. The patient is not nervous/anxious and is not hyperactive.   All other systems reviewed and are negative.      Objective:    BP 92/64   Pulse 66   Temp 97.9 F (36.6 C) (Oral)   Ht 5\' 7"  (1.702 m)   Wt 144 lb (65.3 kg)   SpO2 97%   BMI 22.55 kg/m    Physical Exam Vitals signs and nursing note reviewed.  Constitutional:      General: She is not in acute distress.    Appearance: Normal appearance. She is not ill-appearing.  HENT:     Head: Normocephalic and  atraumatic.     Right Ear: External ear normal.     Left Ear: External ear normal.  Neck:     Musculoskeletal: Normal range of motion.  Cardiovascular:     Rate and Rhythm: Normal rate and regular rhythm.  Pulmonary:     Effort: Pulmonary effort is normal.     Breath sounds: Normal breath sounds.  Musculoskeletal: Normal range of motion.  Skin:    General: Skin is warm  and dry.  Neurological:     General: No focal deficit present.     Mental Status: She is alert.  Psychiatric:        Mood and Affect: Mood normal.        Thought Content: Thought content normal.        Judgment: Judgment normal.           Assessment & Plan:   Syncope, unspecified syncope type - Plan: CBC with Differential/Platelet, Comprehensive metabolic panel, Urinalysis, Routine w reflex microscopic  Acquired hypothyroidism - Plan: TSH, T4, free  Memory impairment - Plan: B12, Ambulatory referral to Neurology  History of ductal carcinoma in situ (DCIS) of breast  History of ovarian cancer No follow-ups on file.

## 2018-08-04 NOTE — Assessment & Plan Note (Signed)
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  Continue aricept, check labs including Vit b12 and refer to neurology.  She is currently taking aricept 10 mg daily. The patient indicates understanding of these issues and agrees with the plan.

## 2018-08-04 NOTE — Assessment & Plan Note (Signed)
Followed by oncology, chart reviewed.

## 2018-08-05 LAB — URINALYSIS, ROUTINE W REFLEX MICROSCOPIC
Bilirubin Urine: NEGATIVE
Hgb urine dipstick: NEGATIVE
KETONES UR: NEGATIVE
Nitrite: NEGATIVE
PH: 5.5 (ref 5.0–8.0)
RBC / HPF: NONE SEEN (ref 0–?)
TOTAL PROTEIN, URINE-UPE24: NEGATIVE
UROBILINOGEN UA: 0.2 (ref 0.0–1.0)
Urine Glucose: NEGATIVE

## 2018-08-12 ENCOUNTER — Encounter: Payer: Self-pay | Admitting: Neurology

## 2018-08-15 ENCOUNTER — Encounter: Payer: Medicare Other | Admitting: Family Medicine

## 2018-09-01 ENCOUNTER — Ambulatory Visit: Payer: Medicare HMO | Admitting: Family Medicine

## 2018-09-21 DIAGNOSIS — D225 Melanocytic nevi of trunk: Secondary | ICD-10-CM | POA: Diagnosis not present

## 2018-09-21 DIAGNOSIS — C44729 Squamous cell carcinoma of skin of left lower limb, including hip: Secondary | ICD-10-CM | POA: Diagnosis not present

## 2018-09-21 DIAGNOSIS — L570 Actinic keratosis: Secondary | ICD-10-CM | POA: Diagnosis not present

## 2018-09-21 DIAGNOSIS — L814 Other melanin hyperpigmentation: Secondary | ICD-10-CM | POA: Diagnosis not present

## 2018-09-21 DIAGNOSIS — Z85828 Personal history of other malignant neoplasm of skin: Secondary | ICD-10-CM | POA: Diagnosis not present

## 2018-09-21 DIAGNOSIS — L821 Other seborrheic keratosis: Secondary | ICD-10-CM | POA: Diagnosis not present

## 2018-10-12 ENCOUNTER — Telehealth: Payer: Self-pay

## 2018-10-12 NOTE — Telephone Encounter (Signed)
Let's see her after her neuro appt.

## 2018-10-12 NOTE — Telephone Encounter (Signed)
TA-Pt's Neuro appt has been pushed out to 7.14.20/[name] is asking if you want to see pt on 5.28.20 or wait until after Neuro appt/also asking if you can help with getting her seen sooner? Plz advise/thx dmf

## 2018-10-12 NOTE — Telephone Encounter (Signed)
Copied from [place] [id number]. Topic: General - Inquiry >> Oct 12, 2018  9:24 AM Virl Axe D wrote: Reason for CRM: Pt's sig other Maryanna Shape stated that her Neuro appt was pushed out to 12/06/18. He would like to know if Dr. Deborra Medina wants to still see pt on 10/20/18 or wait until after Neuro appt and if there is any way Dr. Deborra Medina can help with getting a sooner appt for pt. Please advise. Requesting callback

## 2018-10-13 NOTE — Telephone Encounter (Signed)
Pt aware/thx dmf 

## 2018-10-14 ENCOUNTER — Ambulatory Visit: Payer: Medicare HMO | Admitting: Neurology

## 2018-10-20 ENCOUNTER — Ambulatory Visit: Payer: Medicare HMO | Admitting: Family Medicine

## 2018-10-21 ENCOUNTER — Other Ambulatory Visit: Payer: Self-pay

## 2018-10-24 ENCOUNTER — Other Ambulatory Visit: Payer: Self-pay

## 2018-10-24 ENCOUNTER — Telehealth (INDEPENDENT_AMBULATORY_CARE_PROVIDER_SITE_OTHER): Payer: Medicare HMO | Admitting: Neurology

## 2018-10-24 VITALS — Ht 67.0 in | Wt 140.0 lb

## 2018-10-24 DIAGNOSIS — G3184 Mild cognitive impairment, so stated: Secondary | ICD-10-CM | POA: Diagnosis not present

## 2018-10-24 NOTE — Progress Notes (Signed)
Virtual Visit via Video Note The purpose of this virtual visit is to provide medical care while limiting exposure to the novel coronavirus.    Consent was obtained for video visit:  Yes.   Answered questions that patient had about telehealth interaction:  Yes.   I discussed the limitations, risks, security and privacy concerns of performing an evaluation and management service by telemedicine. I also discussed with the patient that there may be a patient responsible charge related to this service. The patient expressed understanding and agreed to proceed.  Pt location: Home Physician Location: office Name of referring provider:  Lucille Passy, MD I connected with [name] at patients initiation/request on 10/24/2018 at  2:00 PM EDT by video enabled telemedicine application and verified that I am speaking with the correct person using two identifiers. Pt MRN:  [id number] Pt DOB:  1940-10-10 Video Participants:  [name];  Maryanna Shape (significant other)  History of Present Illness:  This is a 78 year old right-handed woman with a history of hypothyroidism, ovarian cancer, breast cancer, presenting for evaluation memory loss. Her significant other [name] is present during the e-visit to provide additional information. She had 2 syncopal episodes in one day last March 2020, and on follow-up with her PCP, memory issues were brought up. She has been taking Donepezil 10mg  since 2018 previously prescribed by Dr. Burnice Logan. She initially did not remember the syncopal episode in March, but on further questioning, she recalls she was at the check out aisle and did not feel good at that time and woke up on the ground, no tongue bite or incontinence. She apparently passed out twice in the store. She states that she was pretty sure it occurred because she was starving. Bloodwork, EKG, and head CT were unremarkable, no further similar spells since then. She feels her memory is "not good,"  but she states she is doing fine living alone. [name] lives a mile away and sees her everyday. He has known her for over 30 years and started notice short-term memory issues worse in the past year, for instance she did not remember the movie they watched last night. He states she has not gotten lost driving, and she has managed to stay on top of her bills and medications. She denies any difficulties as well. She denies leaving the stove on or faucet running. There is no family history of dementia, she denies any history of significant head injuries, she rarely drinks alcohol.  She denies any headaches, dizziness, vision changes, focal numbness/tingling/weakness, neck/back pain, bowel/bladder dysfunction, anosmia, or tremors. Sleep has always been a problem. No wandering behavior, paranoia, or hallucinations. [name] denies any staring/unresponsive episodes. She denies any olfactory/gustatory hallucinations, rising epigastric sensation, myoclonic jerks. She has a daughter living in [place] who she sees once a week.   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   PAST MEDICAL HISTORY: Past Medical History:  Diagnosis Date   DCIS (ductal carcinoma in situ) of breast    Ovarian cancer [facility])    March 1998   Unspecified vitamin D deficiency     PAST SURGICAL HISTORY: Past Surgical History:  Procedure Laterality Date   BIOPSY THYROID     for benign disease   BREAST SURGERY     CHOLECYSTECTOMY     COLON SURGERY     colectomy for cancer   colonscopy  2004  2/08   left ankle fracture     status post surgery, March 2010   MASTECTOMY  1992   left mastectomy   PORT-A-CATH REMOVAL Right 04/15/2015   Procedure: REMOVAL PORT-A-CATH;  Surgeon: [name], MD;  Location: [place];  Service: General;  Laterality: Right;  Right upper chest   TOTAL  ABDOMINAL HYSTERECTOMY W/ BILATERAL SALPINGOOPHORECTOMY  1998   TAH for ovarian cancer    MEDICATIONS: Current Outpatient Medications on File Prior to Visit  Medication Sig Dispense Refill   donepezil (ARICEPT) 10 MG tablet TAKE ONE TABLET BY MOUTH EVERY NIGHT AT BEDTIME (Patient taking differently: Take 10 mg by mouth at bedtime. ) 90 tablet 3   estradiol (ESTRACE) 1 MG tablet TAKE 1 TABLET EVERY DAY (Patient taking differently: Take 0.5 mg by mouth at bedtime. ) 90 tablet 3   levothyroxine (SYNTHROID, LEVOTHROID) 88 MCG tablet Take 1 tablet (88 mcg total) by mouth daily. (Patient taking differently: Take 88 mcg by mouth at bedtime. ) 90 tablet 5   Multiple Vitamin (MULTIVITAMIN WITH MINERALS) TABS tablet Take 1 tablet by mouth at bedtime.     OVER THE COUNTER MEDICATION Apply 1 application topically at bedtime as needed (foot burning/neuropathy). Over the counter cream for neuropathy     Polyvinyl Alcohol-Povidone (REFRESH OP) Place 1 drop into both eyes daily as needed (dry eyes/itching).     Aspirin-Acetaminophen-Caffeine (EXCEDRIN MIGRAINE PO) Take 1 tablet by mouth daily as needed (headache).     No current facility-administered medications on file prior to visit.     ALLERGIES: Allergies  Allergen Reactions   Codeine Sulfate Nausea And Vomiting    Projectile vomiting   Promethazine Hcl Other (See Comments)    Unknown reaction    FAMILY HISTORY: Family History  Problem Relation Age of Onset   Cancer Mother        [name], historyof melanoma    Aneurysm Father        abdominal aortic   Heart disease Father    Diverticulitis Brother    Breast cancer Maternal Aunt     Observations/Objective:   Vitals:   10/24/18 1206  Weight: 140 lb (63.5 kg)  Height: 5\' 7"  (1.702 m)   GEN:  The patient appears stated age and is in NAD.  Neurological examination: Patient is awake, alert, oriented to person, place, year. No aphasia or dysarthria. Reduced fluency, intact  comprehension. Remote and recent memory impaired. Able to repeat. Cranial nerves: Extraocular movements intact with no nystagmus. No facial asymmetry. Motor: moves all extremities symmetrically, at least anti-gravity x 4. No incoordination on finger to nose testing. Gait: narrow-based and steady, able to tandem walk adequately. Negative Romberg test.  Montreal Cognitive Assessment  11/01/2018 MOCA Blind done over phone  Attention: Read list of digits (0/2) 2  Attention: Read list of letters (0/1) 0  Attention: Serial 7 subtraction starting at 100 (0/3) 3  Language: Repeat phrase (0/2) 2  Language : Fluency (0/1) 0  Abstraction (0/2) 2  Delayed Recall (0/5) 0  Orientation (0/6) 3  Total             12/22   Assessment and Plan:   This is a 78 year old right-handed woman with a history of hypothyroidism, breast cancer, ovarian cancer, presenting for evaluation of memory loss. Her [place] today (done over phone) was 12/22 (normal > 18/22). By history, she and [name] deny any difficulties with complex tasks, indicating Mild Cognitive Impairment. Head CT no  acute changes. Continue Donepezil 10mg  daily. We had an extensive discussion about continued monitoring of symptoms by family, monitor driving, and future planning. We discussed the importance of control of vascular risk factors, physical exercise, and brain stimulation exercises for brain health. No further syncopal episodes since March, likely vasovagal. Follow-up in 6 months, she knows to call for any changes.   Follow Up Instructions:   -I discussed the assessment and treatment plan with the patient. The patient was provided an opportunity to ask questions and all were answered. The patient agreed with the plan and demonstrated an understanding of the instructions.   The patient was advised to call back or seek an in-person evaluation if the symptoms worsen or if the condition fails to improve as anticipated.   Cameron Sprang, MD

## 2018-11-01 ENCOUNTER — Telehealth: Payer: Self-pay | Admitting: Hematology

## 2018-11-01 NOTE — Telephone Encounter (Signed)
CALL DAY 7/27 MOVED LAB/FU MOVED TO 8/6 - CONFIRMED WITH PATIENT °

## 2018-12-06 ENCOUNTER — Ambulatory Visit: Payer: Medicare HMO | Admitting: Neurology

## 2018-12-19 ENCOUNTER — Ambulatory Visit: Payer: Medicare Other | Admitting: Hematology

## 2018-12-19 ENCOUNTER — Other Ambulatory Visit: Payer: Medicare Other

## 2018-12-20 ENCOUNTER — Telehealth: Payer: Self-pay

## 2018-12-20 NOTE — Telephone Encounter (Signed)
Questions for Screening COVID-19  Symptom onset: None  Travel or Contacts: None  During this illness, did/does the patient experience any of the following symptoms? Fever >100.9F _0   Yes _1   No _2   Unknown Subjective fever (felt feverish) _3   Yes _4   No _5   Unknown Chills _6   Yes _7   No _8   Unknown Muscle aches (myalgia) _9   Yes _10   No _11   Unknown Runny nose (rhinorrhea) _12   Yes _13   No _14   Unknown Sore throat _15   Yes _16   No _17   Unknown Cough (new onset or worsening of chronic cough) _18   Yes _19   No _20   Unknown Shortness of breath (dyspnea) _21   Yes _22   No _23   Unknown Nausea or vomiting _24   Yes _25   No _26   Unknown Headache _27   Yes _28   No _29   Unknown Abdominal pain  _30   Yes _31   No _32   Unknown Diarrhea (?3 loose/looser than normal stools/24hr period) _33   Yes _34   No _35   Unknown Other, specify:  Patient risk factors: Smoker? _36   Current _37   Former _38   Never If female, currently pregnant? _39   Yes _40   No  Patient Active Problem List   Diagnosis Date Noted  . Syncopal episodes 08/04/2018  . Memory impairment 01/05/2017  . 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 Hx of ovarian cancer 10/29/2015  . Breast cancer screening, high risk patient 10/29/2015  . Vasomotor instability 09/16/2014  . History of ovarian cancer 09/16/2014  . Screening mammogram for high-risk patient 09/16/2014  . BRCA1 positive 03/21/2014  . Vitamin Abdulkadir Emmanuel deficiency   . Peripheral neuropathy 09/06/2012  . Hypothyroidism 05/11/2011  . ADENOCARCINOMA, BREAST, HX OF 02/29/2008  . NEOPLASM, MALIGNANT, OVARY, HX OF 02/29/2008  . ABDOMINAL PAIN, GENERALIZED 06/23/2007    Plan:  _41   High risk for COVID-19 with red flags go to ED (with CP, SOB, weak/lightheaded, or fever > 101.5). Call ahead.  _42   High risk for COVID-19 but stable. Inform provider and coordinate time for [facility] visit.   _43   No red flags but URI signs or symptoms okay for [facility] visit.

## 2018-12-21 ENCOUNTER — Encounter: Payer: Self-pay | Admitting: Family Medicine

## 2018-12-21 ENCOUNTER — Ambulatory Visit (INDEPENDENT_AMBULATORY_CARE_PROVIDER_SITE_OTHER): Payer: Medicare HMO | Admitting: Family Medicine

## 2018-12-21 VITALS — BP 124/76 | HR 77 | Ht 67.0 in | Wt 143.5 lb

## 2018-12-21 DIAGNOSIS — R413 Other amnesia: Secondary | ICD-10-CM

## 2018-12-21 DIAGNOSIS — R55 Syncope and collapse: Secondary | ICD-10-CM

## 2018-12-21 MED ORDER — LEVOTHYROXINE SODIUM 88 MCG PO TABS: 88.0000 ug | ORAL_TABLET | Freq: Every day | ORAL | 3 refills | Status: DC

## 2018-12-21 MED ORDER — DONEPEZIL HCL 10 MG PO TABS: 10.0000 mg | ORAL_TABLET | Freq: Every day | ORAL | 3 refills | Status: DC

## 2018-12-21 NOTE — Progress Notes (Signed)
Subjective:   Patient ID: [name], female    DOB: September 02, 1940, 78 y.o.   MRN: [id number]  [name] is a pleasant 78 y.o. year old female who presents to clinic today with Memory Loss (Pt screened at vehicle. She is here today to F/U after the virtual visit with Neuro Santiago Glad Aquino,MD. CT on 3.20 showed mild chronic microvascular Dz and mild diffuse atrophy but no change in meds.) and Medication Refill (Needs Levothyroxine filled - completely out.)  on 12/21/2018  HPI:  Neurology follow up-  Had a virtual visit with neurology, [name], on 10/24/18 for mild cognitive impairment and syncopal episodes.  Note reviewed.    Head CT without contrast from 3/20 did not show any acute changes.  It did show mild chronic microvascular disease, mild diffuse atrophy.  MOCA Blind was 12/22 (normal is 12/22).  Diagnosed with mild cognitive impairment and advised to continue aricept 10 mg daily.   Lab Results  Component Value Date   TSH 1.70 08/04/2018    Current Outpatient Medications on File Prior to Visit  Medication Sig Dispense Refill  . Aspirin-Acetaminophen-Caffeine (EXCEDRIN MIGRAINE PO) Take 1 tablet by mouth daily as needed (headache).    . donepezil (ARICEPT) 10 MG tablet TAKE ONE TABLET BY MOUTH EVERY NIGHT AT BEDTIME (Patient taking differently: Take 10 mg by mouth at bedtime. ) 90 tablet 3  . estradiol (ESTRACE) 1 MG tablet TAKE 1 TABLET EVERY DAY (Patient taking differently: Take 0.5 mg by mouth at bedtime. ) 90 tablet 3  . levothyroxine (SYNTHROID, LEVOTHROID) 88 MCG tablet Take 1 tablet (88 mcg total) by mouth daily. (Patient taking differently: Take 88 mcg by mouth at bedtime. ) 90 tablet 5  . Multiple Vitamin (MULTIVITAMIN WITH MINERALS) TABS tablet Take 1 tablet by mouth at bedtime.    Marland Kitchen OVER THE COUNTER MEDICATION Apply 1 application topically at bedtime as needed (foot burning/neuropathy). Over the counter cream for neuropathy    . Polyvinyl Alcohol-Povidone  (REFRESH OP) Place 1 drop into both eyes daily as needed (dry eyes/itching).     No current facility-administered medications on file prior to visit.     Allergies  Allergen Reactions  . Codeine Sulfate Nausea And Vomiting    Projectile vomiting  . Promethazine Hcl Other (See Comments)    Unknown reaction    Past Medical History:  Diagnosis Date  . DCIS (ductal carcinoma in situ) of breast   . Ovarian cancer [facility])    March 1998  . Unspecified vitamin D deficiency     Past Surgical History:  Procedure Laterality Date  . BIOPSY THYROID     for benign disease  . BREAST SURGERY    . CHOLECYSTECTOMY    . COLON SURGERY     colectomy for cancer  . colonscopy  2004   2/08  . left ankle fracture     status post surgery, March 2010  . MASTECTOMY  1992   left mastectomy  . PORT-A-CATH REMOVAL Right 04/15/2015   Procedure: REMOVAL PORT-A-CATH;  Surgeon: [name], MD;  Location: [place];  Service: General;  Laterality: Right;  Right upper chest  . TOTAL ABDOMINAL HYSTERECTOMY W/ BILATERAL SALPINGOOPHORECTOMY  1998   TAH for ovarian cancer    Family History  Problem Relation Age of Onset  . Cancer Mother        [name], historyof melanoma   . Aneurysm Father        abdominal aortic  .  Heart disease Father   . Diverticulitis Brother   . Breast cancer Maternal Aunt     Social History   Socioeconomic History  . Marital status: Divorced    Spouse name: Not on file  . Number of children: 2  . Years of education: Not on file  . Highest education level: Master's degree (e.g., MA, MS, MEng, MEd, MSW, MBA)  Occupational History  . Not on file  Social Needs  . Financial resource strain: Not on file  . Food insecurity    Worry: Not on file    Inability: Not on file  . Transportation needs    Medical: Not on file    Non-medical: Not on file  Tobacco Use  . Smoking status: Former Smoker    Quit date: 05/26/1991    Years since quitting: 27.5  .  Smokeless tobacco: Never Used  Substance and Sexual Activity  . Alcohol use: Yes    Comment: social  . Drug use: No  . Sexual activity: Not on file  Lifestyle  . Physical activity    Days per week: Not on file    Minutes per session: Not on file  . Stress: Not on file  Relationships  . Social Herbalist on phone: Not on file    Gets together: Not on file    Attends religious service: Not on file    Active member of club or organization: Not on file    Attends meetings of clubs or organizations: Not on file    Relationship status: Not on file  . Intimate partner violence    Fear of current or ex partner: Not on file    Emotionally abused: Not on file    Physically abused: Not on file    Forced sexual activity: Not on file  Other Topics Concern  . Not on file  Social History Narrative   March 1998- history of stage III ovarian cancer; status post suboptimal debulking; status post 6 cycles of Taxoil and carboplatin with complete remission documented by second look surgery September 1998.      Recurrent ovarian cancer, status post 6 cycles carbo and Taxol, April 1998 until July 2008      1992, left breast intraductal carcinoma (DCIS)   Status post left modified radial mastectomy and with silicon implant   13 of 13 lymph modes negative; positive for BCRA1 gene   Recurrent ovarian carinoma, February 2008   Admit Feb. 2009 partial small bowel obstruction treated medically      Right handed    The PMH, PSH, Social History, Family History, Medications, and allergies have been reviewed in [facility], and have been updated if relevant.                                                                                                                 Review of Systems  Constitutional: Negative.   Psychiatric/Behavioral: Negative for agitation, behavioral problems, dysphoric mood, hallucinations, self-injury, sleep disturbance and suicidal ideas. The patient is  not nervous/anxious and  is not hyperactive.   All other systems reviewed and are negative.      Objective:    BP 124/76   Pulse 77   Ht 5\' 7"  (1.702 m)   Wt 143 lb 8 oz (65.1 kg)   SpO2 96%   BMI 22.48 kg/m    Physical Exam Vitals signs and nursing note reviewed.  Constitutional:      Appearance: Normal appearance.  HENT:     Head: Normocephalic and atraumatic.     Right Ear: External ear normal.     Left Ear: External ear normal.     Nose: Nose normal.     Mouth/Throat:     Mouth: Mucous membranes are moist.  Eyes:     Extraocular Movements: Extraocular movements intact.  Neck:     Musculoskeletal: Normal range of motion.  Cardiovascular:     Rate and Rhythm: Normal rate.     Pulses: Normal pulses.  Pulmonary:     Effort: Pulmonary effort is normal.  Musculoskeletal: Normal range of motion.  Skin:    General: Skin is warm and dry.  Neurological:     General: No focal deficit present.     Mental Status: She is alert.  Psychiatric:        Mood and Affect: Mood normal.        Behavior: Behavior normal.        Thought Content: Thought content normal.        Judgment: Judgment normal.           Assessment & Plan:   Memory impairment -

## 2018-12-21 NOTE — Patient Instructions (Signed)
Great to see you!  I have refilled your medications.  Keep taking them.  You mild memory impairment.

## 2018-12-21 NOTE — Assessment & Plan Note (Signed)
>  15 minutes spent in face to face time with patient, >50% spent in counselling or coordination of care discussing Dr. Amparo Bristol findings and recommendations.  I answered her questions.  She will continue aricept. The patient indicates understanding of these issues and agrees with the plan.

## 2018-12-26 NOTE — Progress Notes (Signed)
[place]   [phone] Fax:([phone]) [phone]   Clinic Follow up Note   Patient Care Team: Lucille Passy, MD as PCP - General (Family Medicine) Cameron Sprang, MD as Consulting Physician (Neurology)  Date of Service:  12/29/2018  CHIEF COMPLAINT:  1. BRCA 1 + 2. Left breast cancer in 1992 3. 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 She was intolerant of tamoxifen. She has a subpectoral implant on right.   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 She had recurrence in March 2008, with surgery then by Dr Margot Chimes for 4 cm mass along colon and left pelvic node. (That pathology was ER PR negative.) Patient recalls that only symptom of recurrence was burning discomfort in LLQ. She had additional 6 cycles of taxol carboplatin from April - July 2008 and has had no known active disease since then. Per patient, ca 2729 marker was elevated with the recurrent ovarian cancer, with normal ca125; that information not available in this EMR.  CURRENT THERAPY:  Surveillance   INTERVAL HISTORY:  [name] is here for a follow up. She was last seen by me 1 year. She presents to the clinic alone. She notes she is doing well. She is staying home most the time. She lives alone but has company. She notes she had a skin cancer on her medial left lower leg. Her dermatologist had it excised and it is still healing. She has had skin cancer in 12/2016.  She still takes low dose Estradial. She only takes half tablet every 2-3 days and is weaning off. She has  been having more hot flashes and having memory loss more often.    REVIEW OF SYSTEMS:   Constitutional: Denies fevers, chills or abnormal weight loss Eyes: Denies blurriness of vision Ears, nose, mouth, throat, and face: Denies mucositis or sore throat Respiratory: Denies cough, dyspnea or wheezes Cardiovascular: Denies palpitation, chest discomfort or lower extremity swelling Gastrointestinal:  Denies nausea, heartburn or change in bowel habits Skin: Denies abnormal skin rashes Lymphatics: Denies new lymphadenopathy or easy bruising Neurological:Denies numbness, tingling or new weaknesses (+) memory issues   Behavioral/Psych: Mood is stable, no new changes (+) Mood swings  All other systems were reviewed with the patient and are negative.  MEDICAL HISTORY:  Past Medical History:  Diagnosis Date  . DCIS (ductal carcinoma in situ) of breast   . Ovarian cancer [facility])    March 1998  . Unspecified vitamin D deficiency     SURGICAL HISTORY: Past Surgical History:  Procedure Laterality Date  . BIOPSY THYROID     for benign disease  . BREAST SURGERY    . CHOLECYSTECTOMY    . COLON SURGERY     colectomy for cancer  . colonscopy  2004   2/08  . left ankle fracture     status post surgery, March 2010  . MASTECTOMY  1992   left mastectomy  . PORT-A-CATH REMOVAL Right 04/15/2015   Procedure: REMOVAL PORT-A-CATH;  Surgeon: [name], MD;  Location: [place];  Service: General;  Laterality: Right;  Right upper  chest  . TOTAL ABDOMINAL HYSTERECTOMY W/ BILATERAL SALPINGOOPHORECTOMY  1998   TAH for ovarian cancer    I have reviewed the social history and family history with the patient and they are unchanged from previous note.  ALLERGIES:  is allergic to codeine sulfate and promethazine hcl.  MEDICATIONS:  Current Outpatient Medications  Medication Sig Dispense Refill  . Aspirin-Acetaminophen-Caffeine (EXCEDRIN MIGRAINE PO) Take 1 tablet by mouth daily as  needed (headache).    . donepezil (ARICEPT) 10 MG tablet Take 1 tablet (10 mg total) by mouth at bedtime. 90 tablet 3  . estradiol (ESTRACE) 1 MG tablet TAKE 1 TABLET EVERY DAY (Patient taking differently: Take 0.5 mg by mouth at bedtime. ) 90 tablet 3  . levothyroxine (SYNTHROID) 88 MCG tablet Take 1 tablet (88 mcg total) by mouth at bedtime. 90 tablet 3  . Multiple Vitamin (MULTIVITAMIN WITH MINERALS) TABS tablet Take 1 tablet by mouth at bedtime.    Marland Kitchen OVER THE COUNTER MEDICATION Apply 1 application topically at bedtime as needed (foot burning/neuropathy). Over the counter cream for neuropathy    . Polyvinyl Alcohol-Povidone (REFRESH OP) Place 1 drop into both eyes daily as needed (dry eyes/itching).     No current facility-administered medications for this visit.     PHYSICAL EXAMINATION: ECOG PERFORMANCE STATUS: 0 - Asymptomatic  Vitals:   12/29/18 1420  BP: (!) 126/58  Pulse: 63  Resp: 17  Temp: 98.3 F (36.8 C)  SpO2: 96%   Filed Weights   12/29/18 1420  Weight: 145 lb 9.6 oz (66 kg)     GENERAL:alert, no distress and comfortable SKIN: skin color, texture, turgor are normal, no rashes or significant lesions EYES: normal, Conjunctiva are pink and non-injected, sclera clear NECK: supple, thyroid normal size, non-tender, without nodularity LYMPH:  no palpable lymphadenopathy in the cervical, axillary  LUNGS: clear to auscultation and percussion with normal breathing effort HEART: regular rate & rhythm and no murmurs and no lower extremity edema ABDOMEN:abdomen soft, non-tender and normal bowel sounds Musculoskeletal:no cyanosis of digits and no clubbing  NEURO: alert & oriented x 3 with fluent speech, no focal motor/sensory deficits BREAST: S/p left mastectomy and b/l reconstruction and right implant: Surgical incisions healed well. No palpable mass, nodules or adenopathy bilaterally. Breast exam benign.   LABORATORY DATA:  I have reviewed the data as listed CBC Latest  Ref Rng & Units 12/29/2018 08/04/2018 07/25/2018  WBC 4.0 - 10.5 K/uL 6.8 4.9 10.8(H)  Hemoglobin 12.0 - 15.0 g/dL 16.1(H) 15.0 16.5(H)  Hematocrit 36.0 - 46.0 % 49.4(H) 44.4 53.1(H)  Platelets 150 - 400 K/uL 196 287.0 198     CMP Latest Ref Rng & Units 12/29/2018 08/04/2018 07/25/2018  Glucose 70 - 99 mg/dL 79 77 103(H)  BUN 8 - 23 mg/dL '18 17 13  ' Creatinine 0.44 - 1.00 mg/dL 0.97 0.93 0.96  Sodium 135 - 145 mmol/L 141 140 138  Potassium 3.5 - 5.1 mmol/L 3.6 3.7 3.9  Chloride 98 - 111 mmol/L 107 104 107  CO2 22 - 32 mmol/L 24 28 20(L)  Calcium 8.9 - 10.3 mg/dL 9.4 9.5 8.9  Total Protein 6.5 - 8.1 g/dL 7.2 6.8 -  Total Bilirubin 0.3 - 1.2 mg/dL 0.2(L) 0.4 -  Alkaline Phos 38 - 126 U/L 127(H) 95 -  AST 15 - 41 U/L 13(L) 10 -  ALT 0 - 44 U/L 16 12 -      RADIOGRAPHIC STUDIES: I have personally reviewed the radiological images as listed and agreed  with the findings in the report. No results found.   ASSESSMENT & PLAN:  MAHATI VAJDA is a 78 y.o. female with   1. History of Ovarian Carcinoma, recurrent, NED now  -She initially diagnosed with stage IIIC in 1998 and pelvic recurrent 2008, s/u surgery and chemo  -She is on surveillance with yearly pelvic exams from her OB now. Last surveillance PET scan in 02/2012 showed no evidence of disease. -She clinically doing well and stable. Labs reviewed, CBC and CMP WNL except Hg 16.1, Hct 49.4%, AST 13, Alk Phos 127, Tbili 0.2. Ca 125 still pending, was normal last year. There is no clinically concerning for recurrence. -I dicussed given she has been stable for some time, she can be discharged from my care and continue to be followed by her PCP and Gyn. She chooses to continue to be followed at this clinic. F/u in 1 year with NP Lacie.    2. History of Left Breast DCIS -S/p left mastectomy and 13 node axillary dissection -S/p left breast reconstruction and right subpectoral implant -She is on breast surveillance with yearly mammograms, and  breast exams with yearly follow ups.  -I previously suggested she separates her mammogram and MRI with 6 months apart. Due to her BRCA1 mutation, yearly screening MRI is recommended. She has not been very compliant, last breast MRI in 2017.    -From a breast cancer standpoint she is clinically doing well. Her physical exam and her 02/2018 mammogram were unremarkable. There is no clinical concern for recurrence. -Continue Surveillance. Next mammogram in 02/2019. Her last breast MRI was in 2017. I again discussed continuing further screening with MRI 6 months apart from mammograms. She is willing to try, may cancel if copay too high.    3. BRCA 1+ -Pt is BRCA 1 mutation positive -Due to her advanced age, right prophylactic mastectomy was not recommended. -We will continue annual mammogram and breast MRI for close monitoring. -Previously, I again encouraged her to get her two daughters tested for BRCA 1 mutation. I discussed the importance and what can be done for them. They need regular mammograms and exams with their gynecologist. She previously stated she has told her daughters but they have not been tested    4. 20+years on supplemental estrogen, estradiol -both Dr. Curlene Dolphin and I previously recommended stoppingthis medication, particularly given known risk factors for breast and gyn cancers. She decreased dose to half tablet but prefers to continue.  -She is currently trying to wean off low dose Estradial. Currently on 0.36m every 2-3 days. Her hot flashes are starting lately.   5. Dementia  -She has developed mild memory issue, currently on donepezil, she is functioning well at home. -She notes mild issues with memory but no significant episodes have occurred.  -She feels her memory issues are progressing   6. Recurrent skin cancer   -She recently had skin cancer of medial lower left leg. Has been removed by her dermatologist. She is not sure what type of skin cancer this was.  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  -She will continue to follow up with her Dermatologist.   PLAN:  -Lab and f/u in 1 year with NP LDaviein 02/2019 and Breast MRI in 1 months    No problem-specific Assessment & Plan notes found for this encounter.   Orders Placed This  Encounter  Procedures  . MR BREAST BILATERAL W WO CONTRAST INC CAD    Screening    Standing Status:   Future    Standing Expiration Date:   02/28/2020    Order Specific Question:   If indicated for the ordered procedure, I authorize the administration of contrast media per Radiology protocol    Answer:   Yes    Order Specific Question:   What is the patient's sedation requirement?    Answer:   No Sedation    Order Specific Question:   Does the patient have a pacemaker or implanted devices?    Answer:   No    Order Specific Question:   Radiology Contrast Protocol - do NOT remove file path    Answer:   [email or web address]    Order Specific Question:   Preferred imaging location?    Answer:   [facility] (table limit-550lbs)  . MM DIGITAL SCREENING W/ IMPLANTS UNI R    Standing Status:   Future    Standing Expiration Date:   02/28/2020    Order Specific Question:   Reason for Exam (SYMPTOM  OR DIAGNOSIS REQUIRED)    Answer:   screening    Order Specific Question:   Preferred imaging location?    Answer:   [facility]   All questions were answered. The patient knows to call the clinic with any problems, questions or concerns. No barriers to learning was detected. I spent 15 minutes counseling the patient face to face. The total time spent in the appointment was 20 minutes and more than 50% was on counseling and review of test results     Truitt Merle, MD 12/29/2018   I, Joslyn Devon, am acting as scribe for Truitt Merle, MD.   I have  reviewed the above documentation for accuracy and completeness, and I agree with the above.

## 2018-12-29 ENCOUNTER — Other Ambulatory Visit: Payer: Self-pay

## 2018-12-29 ENCOUNTER — Encounter: Payer: Self-pay | Admitting: Hematology

## 2018-12-29 ENCOUNTER — Inpatient Hospital Stay (HOSPITAL_BASED_OUTPATIENT_CLINIC_OR_DEPARTMENT_OTHER): Payer: Medicare HMO | Admitting: Hematology

## 2018-12-29 ENCOUNTER — Inpatient Hospital Stay: Payer: Medicare HMO | Attending: Hematology

## 2018-12-29 VITALS — BP 126/58 | HR 63 | Temp 98.3°F | Resp 17 | Ht 67.0 in | Wt 145.6 lb

## 2018-12-29 DIAGNOSIS — Z86000 Personal history of in-situ neoplasm of breast: Secondary | ICD-10-CM | POA: Insufficient documentation

## 2018-12-29 DIAGNOSIS — Z9012 Acquired absence of left breast and nipple: Secondary | ICD-10-CM | POA: Insufficient documentation

## 2018-12-29 DIAGNOSIS — Z8543 Personal history of malignant neoplasm of ovary: Secondary | ICD-10-CM | POA: Diagnosis not present

## 2018-12-29 DIAGNOSIS — E559 Vitamin D deficiency, unspecified: Secondary | ICD-10-CM | POA: Diagnosis not present

## 2018-12-29 DIAGNOSIS — Z7982 Long term (current) use of aspirin: Secondary | ICD-10-CM | POA: Insufficient documentation

## 2018-12-29 DIAGNOSIS — N951 Menopausal and female climacteric states: Secondary | ICD-10-CM | POA: Insufficient documentation

## 2018-12-29 DIAGNOSIS — F0390 Unspecified dementia without behavioral disturbance: Secondary | ICD-10-CM | POA: Insufficient documentation

## 2018-12-29 DIAGNOSIS — Z1501 Genetic susceptibility to malignant neoplasm of breast: Secondary | ICD-10-CM | POA: Insufficient documentation

## 2018-12-29 DIAGNOSIS — Z79899 Other long term (current) drug therapy: Secondary | ICD-10-CM | POA: Insufficient documentation

## 2018-12-29 DIAGNOSIS — Z85828 Personal history of other malignant neoplasm of skin: Secondary | ICD-10-CM | POA: Insufficient documentation

## 2018-12-29 LAB — CBC WITH DIFFERENTIAL/PLATELET
Abs Immature Granulocytes: 0.02 10*3/uL (ref 0.00–0.07)
Basophils Absolute: 0.1 10*3/uL (ref 0.0–0.1)
Basophils Relative: 1 %
Eosinophils Absolute: 0.4 10*3/uL (ref 0.0–0.5)
Eosinophils Relative: 5 %
HCT: 49.4 % — ABNORMAL HIGH (ref 36.0–46.0)
Hemoglobin: 16.1 g/dL — ABNORMAL HIGH (ref 12.0–15.0)
Immature Granulocytes: 0 %
Lymphocytes Relative: 21 %
Lymphs Abs: 1.4 10*3/uL (ref 0.7–4.0)
MCH: 30.5 pg (ref 26.0–34.0)
MCHC: 32.6 g/dL (ref 30.0–36.0)
MCV: 93.6 fL (ref 80.0–100.0)
Monocytes Absolute: 0.7 10*3/uL (ref 0.1–1.0)
Monocytes Relative: 10 %
Neutro Abs: 4.3 10*3/uL (ref 1.7–7.7)
Neutrophils Relative %: 63 %
Platelets: 196 10*3/uL (ref 150–400)
RBC: 5.28 MIL/uL — ABNORMAL HIGH (ref 3.87–5.11)
RDW: 12.3 % (ref 11.5–15.5)
WBC: 6.8 10*3/uL (ref 4.0–10.5)
nRBC: 0 % (ref 0.0–0.2)

## 2018-12-29 LAB — COMPREHENSIVE METABOLIC PANEL
ALT: 16 U/L (ref 0–44)
AST: 13 U/L — ABNORMAL LOW (ref 15–41)
Albumin: 3.8 g/dL (ref 3.5–5.0)
Alkaline Phosphatase: 127 U/L — ABNORMAL HIGH (ref 38–126)
Anion gap: 10 (ref 5–15)
BUN: 18 mg/dL (ref 8–23)
CO2: 24 mmol/L (ref 22–32)
Calcium: 9.4 mg/dL (ref 8.9–10.3)
Chloride: 107 mmol/L (ref 98–111)
Creatinine, Ser: 0.97 mg/dL (ref 0.44–1.00)
GFR calc Af Amer: >60 mL/min (ref >60)
GFR calc non Af Amer: 56 mL/min — ABNORMAL LOW (ref >60)
Glucose, Bld: 79 mg/dL (ref 70–99)
Potassium: 3.6 mmol/L (ref 3.5–5.1)
Sodium: 141 mmol/L (ref 135–145)
Total Bilirubin: 0.2 mg/dL — ABNORMAL LOW (ref 0.3–1.2)
Total Protein: 7.2 g/dL (ref 6.5–8.1)

## 2018-12-30 LAB — CA 125: Cancer Antigen (CA) 125: 8.7 U/mL (ref 0.0–38.1)

## 2019-01-04 ENCOUNTER — Telehealth: Payer: Self-pay | Admitting: Hematology

## 2019-01-04 NOTE — Telephone Encounter (Signed)
Scheduled appt per 8/6 los.  Left a voice message of appt date and time.

## 2019-03-20 ENCOUNTER — Other Ambulatory Visit: Payer: Self-pay

## 2019-03-20 ENCOUNTER — Ambulatory Visit
Admission: RE | Admit: 2019-03-20 | Discharge: 2019-03-20 | Disposition: A | Discharge status: Home / Self Care | Payer: Medicare HMO | Source: Ambulatory Visit | Attending: Hematology | Admitting: Hematology

## 2019-03-20 DIAGNOSIS — Z86000 Personal history of in-situ neoplasm of breast: Secondary | ICD-10-CM

## 2019-03-20 DIAGNOSIS — Z1231 Encounter for screening mammogram for malignant neoplasm of breast: Secondary | ICD-10-CM | POA: Diagnosis not present

## 2019-03-24 DIAGNOSIS — L570 Actinic keratosis: Secondary | ICD-10-CM | POA: Diagnosis not present

## 2019-03-24 DIAGNOSIS — L821 Other seborrheic keratosis: Secondary | ICD-10-CM | POA: Diagnosis not present

## 2019-03-24 DIAGNOSIS — D0461 Carcinoma in situ of skin of right upper limb, including shoulder: Secondary | ICD-10-CM | POA: Diagnosis not present

## 2019-03-24 DIAGNOSIS — D225 Melanocytic nevi of trunk: Secondary | ICD-10-CM | POA: Diagnosis not present

## 2019-03-24 DIAGNOSIS — L814 Other melanin hyperpigmentation: Secondary | ICD-10-CM | POA: Diagnosis not present

## 2019-03-24 DIAGNOSIS — Z85828 Personal history of other malignant neoplasm of skin: Secondary | ICD-10-CM | POA: Diagnosis not present

## 2019-06-12 ENCOUNTER — Ambulatory Visit: Payer: Medicare HMO | Admitting: Neurology

## 2019-08-11 ENCOUNTER — Telehealth (INDEPENDENT_AMBULATORY_CARE_PROVIDER_SITE_OTHER): Payer: Medicare HMO | Admitting: Neurology

## 2019-08-11 ENCOUNTER — Encounter: Payer: Self-pay | Admitting: Neurology

## 2019-08-11 ENCOUNTER — Other Ambulatory Visit: Payer: Self-pay

## 2019-08-11 DIAGNOSIS — G3184 Mild cognitive impairment, so stated: Secondary | ICD-10-CM

## 2019-08-11 DIAGNOSIS — R413 Other amnesia: Secondary | ICD-10-CM

## 2019-08-11 MED ORDER — DONEPEZIL HCL 10 MG PO TABS: 10.0000 mg | ORAL_TABLET | Freq: Every day | ORAL | 3 refills | Status: DC

## 2019-08-11 NOTE — Progress Notes (Signed)
Virtual Visit via Video Note The purpose of this virtual visit is to provide medical care while limiting exposure to the novel coronavirus.    Consent was obtained for video visit:  Yes.   Answered questions that patient had about telehealth interaction:  Yes.   I discussed the limitations, risks, security and privacy concerns of performing an evaluation and management service by telemedicine. I also discussed with the patient that there may be a patient responsible charge related to this service. The patient expressed understanding and agreed to proceed.  Pt location: Home Physician Location: office Name of referring provider:  Lucille Passy, MD I connected with [name] at patients initiation/request on 08/11/2019 at  3:30 PM EDT by video enabled telemedicine application and verified that I am speaking with the correct person using two identifiers. Pt MRN:  [id number] Pt DOB:  [birth year] Video Participants:  [name];  Maryanna Shape (significant other)  History of Present Illness:  The patient had a virtual video visit on 08/11/2019. She was last seen 9 months ago in the neurology clinic for memory loss. MOCA blind (done over phone) 12/22. Her significant other [name] is also present to provide additional information. They both denied any difficulties with complex tasks. She has been taking Donepezil 10mg  daily and states her memory has not improved. She denies getting lost driving but has not been driving much. She denies missing medications. She denies missing bill payments, [name] reminds her last month she had to pay [employer] 2 months because she missed a payment. She denies leaving the stove on. [name] feels her memory is more in a fog than before. No personality changes, no paranoia or hallucinations. She has some difficulty with sleep initiation, but once asleep, she is fine. No daytime drowsiness. She denies any headaches, dizziness, no falls. She is independent with  dressing and bathing, no hygiene concerns.    History on Initial Assessment 10/24/2018: This is a 79 year old right-handed woman with a history of hypothyroidism, ovarian cancer, breast cancer, presenting for evaluation memory loss. Her significant other [name] is present during the e-visit to provide additional information. She had 2 syncopal episodes in one day last March 2020, and on follow-up with her PCP, memory issues were brought up. She has been taking Donepezil 10mg  since 2018 previously prescribed by Dr. Burnice Logan. She initially did not remember the syncopal episode in March, but on further questioning, she recalls she was at the check out aisle and did not feel good at that time and woke up on the ground, no tongue bite or incontinence. She apparently passed out twice in the store. She states that she was pretty sure it occurred because she was starving. Bloodwork, EKG, and head CT were unremarkable, no further similar spells since then. She feels her memory is "not good," but she states she is doing fine living alone. [name] lives a mile away and sees her everyday. He has known her for over 30 years and started notice short-term memory issues worse in the past year, for instance she did not remember the movie they watched last night. He states she has not gotten lost driving, and she has managed to stay on top of her bills and medications. She denies any difficulties as well. She denies leaving the stove on or faucet running. There is no family history of dementia, she denies any history of significant head injuries, she rarely drinks alcohol.  She denies any headaches, dizziness, vision changes, focal  numbness/tingling/weakness, neck/back pain, bowel/bladder dysfunction, anosmia, or tremors. Sleep has always been a problem. No wandering behavior, paranoia, or hallucinations. [name] denies any staring/unresponsive episodes. She denies any olfactory/gustatory hallucinations, rising epigastric sensation,  myoclonic jerks. She has a daughter living in [place] who she sees once a week.   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   PAST MEDICAL HISTORY: Past Medical History:  Diagnosis Date  . DCIS (ductal carcinoma in situ) of breast   . Ovarian cancer [facility])    March 1998  . Unspecified vitamin D deficiency     PAST SURGICAL HISTORY: Past Surgical History:  Procedure Laterality Date  . BIOPSY THYROID     for benign disease  . BREAST SURGERY    . CHOLECYSTECTOMY    . COLON SURGERY     colectomy for cancer  . colonscopy  2004   2/08  . left ankle fracture     status post surgery, March 2010  . MASTECTOMY  1992   left mastectomy  . PORT-A-CATH REMOVAL Right 04/15/2015   Procedure: REMOVAL PORT-A-CATH;  Surgeon: [name], MD;  Location: [place];  Service: General;  Laterality: Right;  Right upper chest  . TOTAL ABDOMINAL HYSTERECTOMY W/ BILATERAL SALPINGOOPHORECTOMY  1998   TAH for ovarian cancer    MEDICATIONS: Current Outpatient Medications on File Prior to Visit  Medication Sig Dispense Refill  . donepezil (ARICEPT) 10 MG tablet TAKE ONE TABLET BY MOUTH EVERY NIGHT AT BEDTIME (Patient taking differently: Take 10 mg by mouth at bedtime. ) 90 tablet 3  . estradiol (ESTRACE) 1 MG tablet TAKE 1 TABLET EVERY DAY (Patient taking differently: Take 0.5 mg by mouth at bedtime. ) 90 tablet 3  . levothyroxine (SYNTHROID, LEVOTHROID) 88 MCG tablet Take 1 tablet (88 mcg total) by mouth daily. (Patient taking differently: Take 88 mcg by mouth at bedtime. ) 90 tablet 5  . Multiple Vitamin (MULTIVITAMIN WITH MINERALS) TABS tablet Take 1 tablet by mouth at bedtime.    Marland Kitchen OVER THE COUNTER MEDICATION Apply 1 application topically at bedtime as needed (foot burning/neuropathy). Over the counter cream for neuropathy    . Polyvinyl Alcohol-Povidone  (REFRESH OP) Place 1 drop into both eyes daily as needed (dry eyes/itching).    . Aspirin-Acetaminophen-Caffeine (EXCEDRIN MIGRAINE PO) Take 1 tablet by mouth daily as needed (headache).     No current facility-administered medications on file prior to visit.     ALLERGIES: Allergies  Allergen Reactions  . Codeine Sulfate Nausea And Vomiting    Projectile vomiting  . Promethazine Hcl Other (See Comments)    Unknown reaction    FAMILY HISTORY: Family History  Problem Relation Age of Onset  . Cancer Mother        [name], historyof melanoma   . Aneurysm Father        abdominal aortic  . Heart disease Father   . Diverticulitis Brother   . Breast cancer Maternal Aunt     Observations/Objective:   GEN:  The patient appears stated age and is in NAD.  Neurological examination: Patient is awake, alert, oriented to person, place, month. She was initially unable to say the month, saying she has not thought about it. Did not know year. No aphasia or dysarthria. Intact fluency and comprehension. Remote and recent memory impaired. 0/3 delayed recall. 5/5 WORLD backward and serial 7s. Able to name and repeat.  Cranial nerves: Extraocular movements intact with no nystagmus. No facial asymmetry. Motor: moves all extremities symmetrically, at least anti-gravity x 4.    Assessment and Plan:   This is a 79 yo RH woman with a history of hypothyroidism, breast cancer, ovarian cancer, with Mild Cognitive Impairment. MOCA blind in August 2020 was 12/22, however she and [name] denied any difficulties with complex tasks. We discussed doing Neurocognitive testing to further evaluate memory concerns. Continue Donepezil 10mg  daily. Continue to monitor driving. Follow-up in 6 months, she knows to call for any changes.    Follow Up Instructions:   -I discussed the assessment and treatment plan with the patient. The patient was provided an opportunity to ask questions and all were answered. The patient agreed  with the plan and demonstrated an understanding of the instructions.   The patient was advised to call back or seek an in-person evaluation if the symptoms worsen or if the condition fails to improve as anticipated.   Cameron Sprang, MD

## 2019-09-22 DIAGNOSIS — Z85828 Personal history of other malignant neoplasm of skin: Secondary | ICD-10-CM | POA: Diagnosis not present

## 2019-09-22 DIAGNOSIS — L821 Other seborrheic keratosis: Secondary | ICD-10-CM | POA: Diagnosis not present

## 2019-09-22 DIAGNOSIS — L814 Other melanin hyperpigmentation: Secondary | ICD-10-CM | POA: Diagnosis not present

## 2019-09-22 DIAGNOSIS — L570 Actinic keratosis: Secondary | ICD-10-CM | POA: Diagnosis not present

## 2019-09-22 DIAGNOSIS — D045 Carcinoma in situ of skin of trunk: Secondary | ICD-10-CM | POA: Diagnosis not present

## 2019-10-04 ENCOUNTER — Other Ambulatory Visit: Payer: Self-pay

## 2019-10-05 ENCOUNTER — Ambulatory Visit (INDEPENDENT_AMBULATORY_CARE_PROVIDER_SITE_OTHER): Payer: Medicare HMO | Admitting: Family Medicine

## 2019-10-05 ENCOUNTER — Encounter: Payer: Self-pay | Admitting: Family Medicine

## 2019-10-05 VITALS — BP 120/78 | HR 61 | Temp 97.5°F | Ht 68.0 in | Wt 154.2 lb

## 2019-10-05 DIAGNOSIS — E785 Hyperlipidemia, unspecified: Secondary | ICD-10-CM

## 2019-10-05 DIAGNOSIS — E039 Hypothyroidism, unspecified: Secondary | ICD-10-CM

## 2019-10-05 DIAGNOSIS — R413 Other amnesia: Secondary | ICD-10-CM

## 2019-10-05 DIAGNOSIS — E559 Vitamin D deficiency, unspecified: Secondary | ICD-10-CM

## 2019-10-05 LAB — LIPID PANEL
Cholesterol: 242 mg/dL — ABNORMAL HIGH (ref 0–200)
HDL: 47.3 mg/dL (ref >39.00)
LDL Cholesterol: 162 mg/dL — ABNORMAL HIGH (ref 0–99)
NonHDL: 194.99
Total CHOL/HDL Ratio: 5
Triglycerides: 166 mg/dL — ABNORMAL HIGH (ref 0.0–149.0)
VLDL: 33.2 mg/dL (ref 0.0–40.0)

## 2019-10-05 LAB — TSH: TSH: 3.1 u[IU]/mL (ref 0.35–4.50)

## 2019-10-05 LAB — VITAMIN D 25 HYDROXY (VIT D DEFICIENCY, FRACTURES): VITD: 41.07 ng/mL (ref 30.00–100.00)

## 2019-10-05 NOTE — Progress Notes (Signed)
[name] is a 79 y.o. female  Chief Complaint  Patient presents with  . Establish Care    Pt here for TOC visit.    HPI: [name] is a 79 y.o. female seen today for a TOC appt, previous PCP Dr. Deborra Medina. She is accompanied by her boyfriend, [name].  Pt has a h/o mild cognitive impairment, on aricept 10mg  daily, seen by [name] most recently on 08/11/19. She is going to have neuropsychiatric testing done and then f/u with [name] in 6 mo.  She also has a h/o hypothyroidism, last TFTs in 07/2018. She is on levothyroxine 25mcg daily.  She also has a h/o hyperlipidemia, last FLP in 10/2017, she is not on any Rx med for this.  She has a h/o Vit D deficiency but does not take a Vit D supplement, only MVI. She has a h/o breast (1992) and ovarian cancer (1998, recurrence in 2008).   Derm - Dr. Elvera Lennox Oncology - Dr. Burr Medico  Past Medical History:  Diagnosis Date  . DCIS (ductal carcinoma in situ) of breast   . Ovarian cancer [facility])    March 1998  . Unspecified vitamin D deficiency     Past Surgical History:  Procedure Laterality Date  . BIOPSY THYROID     for benign disease  . BREAST SURGERY    . CHOLECYSTECTOMY    . COLON SURGERY     colectomy for cancer  . colonscopy  2004   2/08  . left ankle fracture     status post surgery, March 2010  . MASTECTOMY  1992   left mastectomy  . PORT-A-CATH REMOVAL Right 04/15/2015   Procedure: REMOVAL PORT-A-CATH;  Surgeon: [name], MD;  Location: [place];  Service: General;  Laterality: Right;  Right upper chest  . TOTAL ABDOMINAL HYSTERECTOMY W/ BILATERAL SALPINGOOPHORECTOMY  1998   TAH for ovarian cancer    Social History   Socioeconomic History  . Marital status: Divorced    Spouse name: Not on file  . Number of children: 2  . Years of education: Not on file  . Highest education level: Master's degree (e.g., MA, MS, MEng, MEd, MSW, MBA)  Occupational History  . Not on file  Tobacco Use    . Smoking status: Former Smoker    Quit date: 05/26/1991    Years since quitting: 28.3  . Smokeless tobacco: Never Used  Substance and Sexual Activity  . Alcohol use: Yes    Comment: social  . Drug use: No  . Sexual activity: Not on file  Other Topics Concern  . Not on file  Social History Narrative   March 1998- history of stage III ovarian cancer; status post suboptimal debulking; status post 6 cycles of Taxoil and carboplatin with complete remission documented by second look surgery September 1998.      Recurrent ovarian cancer, status post 6 cycles carbo and Taxol, April 1998 until July 2008      1992, left breast intraductal carcinoma (DCIS)   Status post left modified radial mastectomy and with silicon implant   13 of 13 lymph modes negative; positive for BCRA1 gene   Recurrent ovarian carinoma, February 2008   Admit Feb. 2009 partial small bowel obstruction treated medically      Right handed    Social Determinants of Health   Financial Resource Strain:   . Difficulty of Paying Living Expenses:   Food Insecurity:   . Worried About [employer] of  Food in the Last Year:   . [place] in the Last Year:   Transportation Needs:   . Film/video editor (Medical):   Marland Kitchen Lack of Transportation (Non-Medical):   Physical Activity:   . Days of Exercise per Week:   . Minutes of Exercise per Session:   Stress:   . Feeling of Stress :   Social Connections:   . Frequency of Communication with Friends and Family:   . Frequency of Social Gatherings with Friends and Family:   . Attends Religious Services:   . Active Member of Clubs or Organizations:   . Attends Archivist Meetings:   Marland Kitchen Marital Status:   Intimate Partner Violence:   . Fear of Current or Ex-Partner:   . Emotionally Abused:   Marland Kitchen Physically Abused:   . Sexually Abused:     Family History  Problem Relation Age of Onset  . Cancer Mother        [name], historyof melanoma   . Aneurysm Father         abdominal aortic  . Heart disease Father   . Diverticulitis Brother   . Breast cancer Maternal Aunt      Immunization History  Administered Date(s) Administered  . Pneumococcal Conjugate-13 09/22/2017  . Tdap 06/27/2012  . Zoster 03/28/2008    Outpatient Encounter Medications as of 10/05/2019  Medication Sig  . Aspirin-Acetaminophen-Caffeine (EXCEDRIN MIGRAINE PO) Take 1 tablet by mouth daily as needed (headache).  . donepezil (ARICEPT) 10 MG tablet Take 1 tablet (10 mg total) by mouth at bedtime.  Marland Kitchen estradiol (ESTRACE) 1 MG tablet TAKE 1 TABLET EVERY DAY (Patient taking differently: Take 0.5 mg by mouth at bedtime. )  . levothyroxine (SYNTHROID) 88 MCG tablet Take 1 tablet (88 mcg total) by mouth at bedtime.  . Multiple Vitamin (MULTIVITAMIN WITH MINERALS) TABS tablet Take 1 tablet by mouth at bedtime.  Marland Kitchen OVER THE COUNTER MEDICATION Apply 1 application topically at bedtime as needed (foot burning/neuropathy). Over the counter cream for neuropathy  . Polyvinyl Alcohol-Povidone (REFRESH OP) Place 1 drop into both eyes daily as needed (dry eyes/itching).   No facility-administered encounter medications on file as of 10/05/2019.     ROS: Pertinent positives and negatives noted in HPI. Remainder of ROS non-contributory    Allergies  Allergen Reactions  . Codeine Sulfate Nausea And Vomiting    Projectile vomiting  . Promethazine Hcl Other (See Comments)    Unknown reaction    Pulse 61   Temp (!) 97.5 F (36.4 C) (Temporal)   Ht 5\' 8"  (1.727 m)   Wt 154 lb 3.2 oz (69.9 kg)   SpO2 97%   BMI 23.45 kg/m   Physical Exam  Constitutional: She is oriented to person, place, and time. She appears well-developed and well-nourished. No distress.  Cardiovascular: Normal rate and regular rhythm.  Pulmonary/Chest: Effort normal and breath sounds normal. No respiratory distress.  Neurological: She is alert and oriented to person, place, and time.  Psychiatric: She has a normal mood  and affect. Her behavior is normal.     A/P:  1. Memory impairment - on aricept 10mg  daily - follows with neuro [name] and has appt with Dr. Melvyn Novas for neurocognitive testing on 10/19/19 - memory decline worse/more noticeable in the past year   2. Acquired hypothyroidism - stable x years - cont levothyroxine 83mcg daily - TSH  3. Dyslipidemia - Lipid panel  4. Vitamin D deficiency - VITAMIN D 25 Hydroxy (  Vit-D Deficiency, Fractures)    This visit occurred during the SARS-CoV-2 public health emergency.  Safety protocols were in place, including screening questions prior to the visit, additional usage of staff PPE, and extensive cleaning of exam room while observing appropriate contact time as indicated for disinfecting solutions.

## 2019-10-06 ENCOUNTER — Encounter: Payer: Self-pay | Admitting: Family Medicine

## 2019-10-19 ENCOUNTER — Other Ambulatory Visit: Payer: Self-pay

## 2019-10-19 ENCOUNTER — Encounter: Payer: Self-pay | Admitting: Psychology

## 2019-10-19 ENCOUNTER — Ambulatory Visit: Payer: Medicare HMO | Admitting: Psychology

## 2019-10-19 ENCOUNTER — Ambulatory Visit (INDEPENDENT_AMBULATORY_CARE_PROVIDER_SITE_OTHER): Payer: Medicare HMO | Admitting: Psychology

## 2019-10-19 DIAGNOSIS — R4189 Other symptoms and signs involving cognitive functions and awareness: Secondary | ICD-10-CM

## 2019-10-19 DIAGNOSIS — G3184 Mild cognitive impairment, so stated: Secondary | ICD-10-CM | POA: Insufficient documentation

## 2019-10-19 HISTORY — DX: Mild cognitive impairment of uncertain or unknown etiology: G31.84

## 2019-10-19 NOTE — Progress Notes (Signed)
   Psychometrician Note   Cognitive testing was administered to [name] by Milana Kidney, B.S. (psychometrist) under the supervision of Dr. Christia Reading, Ph.D., licensed psychologist on 10/19/19. [name] did not appear overtly distressed by the testing session per behavioral observation or responses across self-report questionnaires. Dr. Christia Reading, Ph.D. checked in with [name] as needed to manage any distress related to testing procedures (if applicable). Rest breaks were offered.    The battery of tests administered was selected by Dr. Christia Reading, Ph.D. with consideration to [name]'s current level of functioning, the nature of her symptoms, emotional and behavioral responses during interview, level of literacy, observed level of motivation/effort, and the nature of the referral question. This battery was communicated to the psychometrist. Communication between Dr. Christia Reading, Ph.D. and the psychometrist was ongoing throughout the evaluation and Dr. Christia Reading, Ph.D. was immediately accessible at all times. Dr. Christia Reading, Ph.D. provided supervision to the psychometrist on the date of this service to the extent necessary to assure the quality of all services provided.    [name] will return within approximately 1-2 weeks for an interactive feedback session with Dr. Melvyn Novas at which time her test performances, clinical impressions, and treatment recommendations will be reviewed in detail. [name] understands she can contact our office should she require our assistance before this time.  A total of 155 minutes of billable time were spent face-to-face with [name] by the psychometrist. This includes both test administration and scoring time. Billing for these services is reflected in the clinical report generated by Dr. Christia Reading, Ph.D..  This note reflects time spent with the psychometrician and does not include test  scores or any clinical interpretations made by Dr. Melvyn Novas. The full report will follow in a separate note.
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
## 2019-10-20 ENCOUNTER — Encounter: Payer: Self-pay | Admitting: Psychology

## 2019-10-20 DIAGNOSIS — G3184 Mild cognitive impairment, so stated: Secondary | ICD-10-CM

## 2019-10-20 HISTORY — DX: Mild cognitive impairment of uncertain or unknown etiology: G31.84

## 2019-10-30 ENCOUNTER — Ambulatory Visit (INDEPENDENT_AMBULATORY_CARE_PROVIDER_SITE_OTHER): Payer: Medicare HMO | Admitting: Psychology

## 2019-10-30 DIAGNOSIS — G3184 Mild cognitive impairment, so stated: Secondary | ICD-10-CM | POA: Diagnosis not present

## 2019-10-30 NOTE — Progress Notes (Addendum)
   Neuropsychology Feedback Session [name] Department of Neurology  Reason for Referral:   [name] a 79 y.o. right-handed Caucasian female referred by [name], M.D.,to characterize hercurrent cognitive functioning and assist with diagnostic clarity and treatment planning in the context of subjective cognitive decline.   Feedback:   [name] completed a comprehensive neuropsychological evaluation on 10/19/2019. Please refer to that encounter for the full report and recommendations. Briefly, results suggested primary deficits with retrieval/consolidation aspects of both verbal and visual memory. Additional variability was exhibited across processing speed. Performance was generally appropriate across domains of attention/concentration, executive functioning, receptive and expressive language, visuospatial functioning, and encoding (i.e., learning) aspects of memory. [name] denied difficulties completing instrumental activities of daily living (ADLs) independently. 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   [name] was accompanied by her significant other [name] during the current telephone call. They were located at [name]'s residence while I was located within my office. Content of the current session focused on the results of her neuropsychological evaluation. [name] and [name] were given the opportunity to ask questions and their questions were answered. Of note, [name] asked appropriate questions regarding specific tests she had previously completed. This could suggest some preserved memory consolidation not captured across current testing. Her and [name] were encouraged to reach out should additional questions arise. A  copy of her report was mailed at the conclusion of the visit.      23 minutes were spent conducting the current feedback session with [name], billed as one unit [id number].

## 2019-11-28 ENCOUNTER — Other Ambulatory Visit: Payer: Self-pay

## 2019-11-28 NOTE — Progress Notes (Signed)
Subjective:   [name] is a 79 y.o. female who presents for an Initial Medicare Annual Wellness Visit.  Review of Systems     Cardiac Risk Factors include: advanced age (>13mn, >>28women);sedentary lifestyle     Objective:    Today's Vitals   11/29/19 1359  BP: 110/60  Pulse: (!) 58  Resp: 16  Temp: 98.2 F (36.8 C)  TempSrc: Temporal  SpO2: 98%  Weight: 151 lb 12.8 oz (68.9 kg)  Height: '5\' 8"'  (1.727 m)   Body mass index is 23.08 kg/m.  Advanced Directives 11/29/2019 10/24/2018 07/25/2018 03/23/2016 10/28/2015 04/10/2015 03/21/2014  Does Patient Have a Medical Advance Directive? No (No Data) No Yes Yes Yes Yes  Type of Advance Directive - - -Public librarianLiving will HLa ValeLiving will [place]inst[place]Living will -  Copy of [place] Chart? - - - No - copy requested No - copy requested - No - copy requested  Would patient like information on creating a medical advance directive? Yes (MAU/Ambulatory/Procedural Areas - Information given) - No - Patient declined - - - -    Current Medications (verified) Outpatient Encounter Medications as of 11/29/2019  Medication Sig  . Aspirin-Acetaminophen-Caffeine (EXCEDRIN MIGRAINE PO) Take 1 tablet by mouth daily as needed (headache).  . donepezil (ARICEPT) 10 MG tablet Take 1 tablet (10 mg total) by mouth at bedtime.  .Marland Kitchenestradiol (ESTRACE) 1 MG tablet TAKE 1 TABLET EVERY DAY (Patient taking differently: Take 0.5 mg by mouth at bedtime. )  . levothyroxine (SYNTHROID) 88 MCG tablet Take 1 tablet (88 mcg total) by mouth at bedtime.  . Multiple Vitamin (MULTIVITAMIN WITH MINERALS) TABS tablet Take 1 tablet by mouth at bedtime.  .Marland KitchenOVER THE COUNTER MEDICATION Apply 1 application topically at bedtime as needed (foot burning/neuropathy). Over the counter cream for neuropathy  . Polyvinyl Alcohol-Povidone (REFRESH OP) Place 1 drop into both eyes daily as needed (dry  eyes/itching).   No facility-administered encounter medications on file as of 11/29/2019.    Allergies (verified) Codeine sulfate and Promethazine hcl   History: Past Medical History:  Diagnosis Date  . BRCA1 positive 03/21/2014  . 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 History of malignant neoplasm of breast Approx. 1993   Adenocarcinoma  . History of malignant neoplasm of ovary 1998 and 2004  . Hypothyroidism 05/11/2011  . 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 Peripheral neuropathy 09/06/2012  . Syncopal episodes 08/04/2018  . Vitamin D deficiency    Past Surgical History:  Procedure Laterality Date  . BIOPSY THYROID     for benign disease  . BREAST SURGERY    . CHOLECYSTECTOMY    . COLON SURGERY     colectomy for cancer  . colonscopy  2004   2/08  . left ankle fracture     status post surgery, March 2010  . MASTECTOMY  1992   left mastectomy  . PORT-A-CATH REMOVAL Right 04/15/2015   Procedure: REMOVAL PORT-A-CATH;  Surgeon: TJackolyn Confer MD;  [place] [facility]  Service: General;  Laterality: Right;  Right upper chest  . TOTAL ABDOMINAL HYSTERECTOMY W/ BILATERAL SALPINGOOPHORECTOMY  1998   TAH for ovarian cancer   Family History  Problem Relation Age of Onset  . Cancer Mother        ovarian; history of melanoma   . Aneurysm Father        abdominal aortic  .  Heart disease Father   . Diverticulitis Brother   . Breast cancer Maternal Aunt    Social History   Socioeconomic History  . Marital status: Divorced    Spouse name: Not on file  . Number of children: 2  . Years of education: 3  . Highest education level: Master's degree (e.g., MA, MS, MEng, MEd, MSW, MBA)  Occupational History  . Occupation: Retired  Tobacco Use  . Smoking status: Former Smoker    Quit date: 05/26/1991    Years since quitting: 28.5  . Smokeless tobacco: Never Used  Vaping Use  . Vaping Use: Never used  Substance and  Sexual Activity  . Alcohol use: Yes    Comment: social  . Drug use: No  . Sexual activity: Not on file  Other Topics Concern  . Not on file  Social History Narrative   March 1998- history of stage III ovarian cancer; status post suboptimal debulking; status post 6 cycles of Taxoil and carboplatin with complete remission documented by second look surgery September 1998.      Recurrent ovarian cancer, status post 6 cycles carbo and Taxol, April 1998 until July 2008      1992, left breast intraductal carcinoma (DCIS)   Status post left modified radial mastectomy and with silicon implant   13 of 13 lymph modes negative; positive for BCRA1 gene   Recurrent ovarian carinoma, February 2008   Admit Feb. 2009 partial small bowel obstruction treated medically      Right handed    Social Determinants of Health   Financial Resource Strain: Low Risk   . Difficulty of Paying Living Expenses: Not hard at all  Food Insecurity: No Food Insecurity  . Worried About Charity fundraiser in the Last Year: Never true  . Ran Out of Food in the Last Year: Never true  Transportation Needs: No Transportation Needs  . Lack of Transportation (Medical): No  . Lack of Transportation (Non-Medical): No  Physical Activity: Inactive  . Days of Exercise per Week: 0 days  . Minutes of Exercise per Session: 0 min  Stress: No Stress Concern Present  . Feeling of Stress : Not at all  Social Connections: Moderately Isolated  . Frequency of Communication with Friends and Family: More than three times a week  . Frequency of Social Gatherings with Friends and Family: Once a week  . Attends Religious Services: Never  . Active Member of Clubs or Organizations: No  . Attends Archivist Meetings: Never  . Marital Status: Living with partner    Tobacco Counseling Counseling given: Not Answered   Clinical Intake:  Pre-visit preparation completed: Yes  Pain : No/denies pain     Nutritional Status: BMI  of 19-24  Normal Nutritional Risks: None Diabetes: No  How often do you need to have someone help you when you read instructions, pamphlets, or other written materials from your doctor or pharmacy?: 1 - Never  Diabetic?No  Interpreter Needed?: No  Information entered by :: Caroleen Hamman LPN   Activities of Daily Living In your present state of health, do you have any difficulty performing the following activities: 11/29/2019  Hearing? N  Vision? N  Difficulty concentrating or making decisions? Y  Comment some memory loss  Walking or climbing stairs? N  Dressing or bathing? N  Doing errands, shopping? N  Preparing Food and eating ? N  Using the Toilet? N  In the past six months, have you accidently leaked urine? N  Do you have problems with loss of bowel control? N  Managing your Medications? N  Managing your Finances? N  Housekeeping or managing your Housekeeping? N  Some recent data might be hidden    Patient Care Team: Ronnald Nian, DO as PCP - General (Family Medicine) Cameron Sprang, MD as Consulting Physician (Neurology)  Indicate any recent Medical Services you may have received from other than [facility] providers in the past year (date may be approximate).     Assessment:   This is a routine wellness examination for [place].  Hearing/Vision screen  Hearing Screening   '125Hz'  '250Hz'  '500Hz'  '1000Hz'  '2000Hz'  '3000Hz'  '4000Hz'  '6000Hz'  '8000Hz'   Right ear:           Left ear:           Comments: No issues  Vision Screening Comments: No  issues- sees Dr. Rosalio Loud of last visit  Dietary issues and exercise activities discussed: Current Exercise Habits: The patient does not participate in regular exercise at present, Exercise limited by: None identified  Goals    . Patient Stated     Would like to start walking       Depression Screen PHQ 2/9 Scores 11/29/2019 09/22/2017 10/22/2015  PHQ - 2 Score 0 0 0    Fall Risk Fall Risk  11/29/2019 08/10/2019 10/24/2018 09/22/2017  12/14/2016  Falls in the past year? 0 0 0 No No  Comment - - - - Emmi Telephone Survey: data to providers prior to load  Number falls in past yr: 0 0 0 - -  Comment - - - - -  Injury with Fall? 0 0 0 - -  Comment - - - - -  Follow up Falls prevention discussed - - - -    Any stairs in or around the home?Yes If so, are there any without handrails? No  Home free of loose throw rugs in walkways, pet beds, electrical cords, etc? Yes  Adequate lighting in your home to reduce risk of falls? Yes   ASSISTIVE DEVICES UTILIZED TO PREVENT FALLS:  Life alert? No  Use of a cane, walker or w/c? No  Grab bars in the bathroom? No  Shower chair or bench in shower? No  Elevated toilet seat or a handicapped toilet? No   TIMED UP AND GO:  Was the test performed? Yes .  Length of time to ambulate 10 feet: 11 sec.   Gait slow and steady without use of assistive device  Cognitive Function:   Montreal Cognitive Assessment  11/01/2018  Attention: Read list of digits (0/2) 2  Attention: Read list of letters (0/1) 0  Attention: Serial 7 subtraction starting at 100 (0/3) 3  Language: Repeat phrase (0/2) 2  Language : Fluency (0/1) 0  Abstraction (0/2) 2  Delayed Recall (0/5) 0  Orientation (0/6) 3   6CIT Screen 11/29/2019  What Year? 0 points  What month? 3 points  What time? 3 points  Count back from 20 0 points  Months in reverse 2 points  Repeat phrase 6 points  Total Score 14    Immunizations Immunization History  Administered Date(s) Administered  . PFIZER SARS-COV-2 Vaccination 07/06/2019, 07/31/2019  . Pneumococcal Conjugate-13 09/22/2017  . Tdap 06/27/2012  . Zoster 03/28/2008    TDAP status: Up to date   Flu Vaccine status: Up to date   Pneumococcal vaccine status: Declined,  Education has been provided regarding the importance of this vaccine but patient still declined. Advised may receive this  vaccine at local pharmacy or Health Dept. Aware to provide a copy of the  vaccination record if obtained from local pharmacy or Health Dept. Verbalized acceptance and understanding. Pneumovax 23 due-discuss with pcp at next office visit  Covid-19 vaccine status: Completed vaccines  Qualifies for Shingles Vaccine? Yes   Zostavax completed Yes   Shingrix Completed?: No.    Education has been provided regarding the importance of this vaccine. Patient has been advised to call insurance company to determine out of pocket expense if they have not yet received this vaccine. Advised may also receive vaccine at local pharmacy or Health Dept. Verbalized acceptance and understanding.  Screening Tests Health Maintenance  Topic Date Due  . DEXA SCAN  Never done  . PNA vac Low Risk Adult (2 of 2 - PPSV23) 09/23/2018  . INFLUENZA VACCINE  12/24/2019  . MAMMOGRAM  03/19/2020  . TETANUS/TDAP  06/27/2022  . COVID-19 Vaccine  Completed    Health Maintenance  Health Maintenance Due  Topic Date Due  . DEXA SCAN  Never done  . PNA vac Low Risk Adult (2 of 2 - PPSV23) 09/23/2018    Colorectal cancer screening: No longer required.    Mammogram status: Completed 03/20/2019. Repeat every year   Bone Density status: Never; Patient would like to wait & have bone density done in October when she has her next mammogram.  Lung Cancer Screening: (Low Dose CT Chest recommended if Age 7-80 years, 30 pack-year currently smoking OR have quit w/in 15years.) does not qualify.     Additional Screening:  Hepatitis C Screening: does not qualify  Vision Screening: Recommended annual ophthalmology exams for early detection of glaucoma and other disorders of the eye. Is the patient up to date with their annual eye exam?  Yes  Who is the provider or what is the name of the office in which the patient attends annual eye exams? Dr. Prudencio Burly   Dental Screening: Recommended annual dental exams for proper oral hygiene  Community Resource Referral / Chronic Care Management: CRR required this  visit?  No   CCM required this visit?  No      Plan:     I have personally reviewed and noted the following in the patient's chart:   . Medical and social history . Use of alcohol, tobacco or illicit drugs  . Current medications and supplements . Functional ability and status . Nutritional status . Physical activity . Advanced directives . List of other physicians . Hospitalizations, surgeries, and ER visits in previous 12 months . Vitals . Screenings to include cognitive, depression, and falls . Referrals and appointments  In addition, I have reviewed and discussed with patient certain preventive protocols, quality metrics, and best practice recommendations. A written personalized care plan for preventive services as well as general preventive health recommendations were provided to patient.    Patient to access AVS via mychart.   Marta Antu, LPN   [id number]  Nurse Health Advisor  Nurse Notes: None

## 2019-11-29 ENCOUNTER — Ambulatory Visit (INDEPENDENT_AMBULATORY_CARE_PROVIDER_SITE_OTHER): Payer: Medicare HMO

## 2019-11-29 VITALS — BP 110/60 | HR 58 | Temp 98.2°F | Resp 16 | Ht 68.0 in | Wt 151.8 lb

## 2019-11-29 DIAGNOSIS — Z0000 Encounter for general adult medical examination without abnormal findings: Secondary | ICD-10-CM

## 2019-11-29 NOTE — Patient Instructions (Signed)
Ms. [name] , Thank you for taking time to come for your Medicare Wellness Visit. I appreciate your ongoing commitment to your health goals. Please review the following plan we discussed and let me know if I can assist you in the future.   Screening recommendations/referrals: Colonoscopy: Completed 03/08/2012- No longer indicated Mammogram: Completed 03/20/2019-Due 03/19/2020 Bone Density: Due-Schedule with mammogram in October 2021 Recommended yearly ophthalmology/optometry visit for glaucoma screening and checkup Recommended yearly dental visit for hygiene and checkup  Vaccinations: Influenza vaccine: Due 01/2020 Pneumococcal vaccine: Pneumovax 23 due-Discuss with PCP at next office visit Tdap vaccine: Up to date- Due 06/27/2022 Shingles vaccine: Discuss with pharmacy   Covid-19:Completed vaccines  Advanced directives: Discussed. Bring copy to be scanned into chart once completed.  Conditions/risks identified: See problem list  Next appointment: Follow up in one year for your annual wellness visit    Preventive Care 65 Years and Older, Female Preventive care refers to lifestyle choices and visits with your health care provider that can promote health and wellness. What does preventive care include?  A yearly physical exam. This is also called an annual well check.  Dental exams once or twice a year.  Routine eye exams. Ask your health care provider how often you should have your eyes checked.  Personal lifestyle choices, including:  Daily care of your teeth and gums.  Regular physical activity.  Eating a healthy diet.  Avoiding tobacco and drug use.  Limiting alcohol use.  Practicing safe sex.  Taking low-dose aspirin every day.  Taking vitamin and mineral supplements as recommended by your health care provider. What happens during an annual well check? The services and screenings done by your health care provider during your annual well check will depend on your  age, overall health, lifestyle risk factors, and family history of disease. Counseling  Your health care provider may ask you questions about your:  Alcohol use.  Tobacco use.  Drug use.  Emotional well-being.  Home and relationship well-being.  Sexual activity.  Eating habits.  History of falls.  Memory and ability to understand (cognition).  Work and work Statistician.  Reproductive health. Screening  You may have the following tests or measurements:  Height, weight, and BMI.  Blood pressure.  Lipid and cholesterol levels. These may be checked every 5 years, or more frequently if you are over 46 years old.  Skin check.  Lung cancer screening. You may have this screening every year starting at age 34 if you have a 30-pack-year history of smoking and currently smoke or have quit within the past 15 years.  Fecal occult blood test (FOBT) of the stool. You may have this test every year starting at age 14.  Flexible sigmoidoscopy or colonoscopy. You may have a sigmoidoscopy every 5 years or a colonoscopy every 10 years starting at age 60.  Hepatitis C blood test.  Hepatitis B blood test.  Sexually transmitted disease (STD) testing.  Diabetes screening. This is done by checking your blood sugar (glucose) after you have not eaten for a while (fasting). You may have this done every 1-3 years.  Bone density scan. This is done to screen for osteoporosis. You may have this done starting at age 2.  Mammogram. This may be done every 1-2 years. Talk to your health care provider about how often you should have regular mammograms. Talk with your health care provider about your test results, treatment options, and if necessary, the need for more tests. Vaccines  Your health care provider  may recommend certain vaccines, such as:  Influenza vaccine. This is recommended every year.  Tetanus, diphtheria, and acellular pertussis (Tdap, Td) vaccine. You may need a Td booster  every 10 years.  Zoster vaccine. You may need this after age 7.  Pneumococcal 13-valent conjugate (PCV13) vaccine. One dose is recommended after age 5.  Pneumococcal polysaccharide (PPSV23) vaccine. One dose is recommended after age 61. Talk to your health care provider about which screenings and vaccines you need and how often you need them. This information is not intended to replace advice given to you by your health care provider. Make sure you discuss any questions you have with your health care provider. Document Released: 06/07/2015 Document Revised: 01/29/2016 Document Reviewed: 03/12/2015 Elsevier Interactive Patient Education  2017 [employer] Prevention in the Home Falls can cause injuries. They can happen to people of all ages. There are many things you can do to make your home safe and to help prevent falls. What can I do on the outside of my home?  Regularly fix the edges of walkways and driveways and fix any cracks.  Remove anything that might make you trip as you walk through a door, such as a raised step or threshold.  Trim any bushes or trees on the path to your home.  Use bright outdoor lighting.  Clear any walking paths of anything that might make someone trip, such as rocks or tools.  Regularly check to see if handrails are loose or broken. Make sure that both sides of any steps have handrails.  Any raised decks and porches should have guardrails on the edges.  Have any leaves, snow, or ice cleared regularly.  Use sand or salt on walking paths during winter.  Clean up any spills in your garage right away. This includes oil or grease spills. What can I do in the bathroom?  Use night lights.  Install grab bars by the toilet and in the tub and shower. Do not use towel bars as grab bars.  Use non-skid mats or decals in the tub or shower.  If you need to sit down in the shower, use a plastic, non-slip stool.  Keep the floor dry. Clean up any  water that spills on the floor as soon as it happens.  Remove soap buildup in the tub or shower regularly.  Attach bath mats securely with double-sided non-slip rug tape.  Do not have throw rugs and other things on the floor that can make you trip. What can I do in the bedroom?  Use night lights.  Make sure that you have a light by your bed that is easy to reach.  Do not use any sheets or blankets that are too big for your bed. They should not hang down onto the floor.  Have a firm chair that has side arms. You can use this for support while you get dressed.  Do not have throw rugs and other things on the floor that can make you trip. What can I do in the kitchen?  Clean up any spills right away.  Avoid walking on wet floors.  Keep items that you use a lot in easy-to-reach places.  If you need to reach something above you, use a strong step stool that has a grab bar.  Keep electrical cords out of the way.  Do not use floor polish or wax that makes floors slippery. If you must use wax, use non-skid floor wax.  Do not have throw rugs  and other things on the floor that can make you trip. What can I do with my stairs?  Do not leave any items on the stairs.  Make sure that there are handrails on both sides of the stairs and use them. Fix handrails that are broken or loose. Make sure that handrails are as long as the stairways.  Check any carpeting to make sure that it is firmly attached to the stairs. Fix any carpet that is loose or worn.  Avoid having throw rugs at the top or bottom of the stairs. If you do have throw rugs, attach them to the floor with carpet tape.  Make sure that you have a light switch at the top of the stairs and the bottom of the stairs. If you do not have them, ask someone to add them for you. What else can I do to help prevent falls?  Wear shoes that:  Do not have high heels.  Have rubber bottoms.  Are comfortable and fit you well.  Are closed  at the toe. Do not wear sandals.  If you use a stepladder:  Make sure that it is fully opened. Do not climb a closed stepladder.  Make sure that both sides of the stepladder are locked into place.  Ask someone to hold it for you, if possible.  Clearly mark and make sure that you can see:  Any grab bars or handrails.  First and last steps.  Where the edge of each step is.  Use tools that help you move around (mobility aids) if they are needed. These include:  Canes.  Walkers.  Scooters.  Crutches.  Turn on the lights when you go into a dark area. Replace any light bulbs as soon as they burn out.  Set up your furniture so you have a clear path. Avoid moving your furniture around.  If any of your floors are uneven, fix them.  If there are any pets around you, be aware of where they are.  Review your medicines with your doctor. Some medicines can make you feel dizzy. This can increase your chance of falling. Ask your doctor what other things that you can do to help prevent falls. This information is not intended to replace advice given to you by your health care provider. Make sure you discuss any questions you have with your health care provider. Document Released: 03/07/2009 Document Revised: 10/17/2015 Document Reviewed: 06/15/2014 Elsevier Interactive Patient Education  2017 [employer].

## 2019-12-06 NOTE — Progress Notes (Signed)
I reviewed and agree with the documentation and plan as outlined below.   

## 2019-12-27 NOTE — Progress Notes (Signed)
[place]   [phone] Fax:([phone]) [phone]   Clinic Follow up Note   Patient Care Team: Ronnald Nian, DO as PCP - General (Family Medicine) Cameron Sprang, MD as Consulting Physician (Neurology) 12/28/2019  CHIEF COMPLAINT:  1. BRCA 1 + 2. Left breast cancer in 1992 3. 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 She was intolerant of tamoxifen. She has a subpectoral implant on right.   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 She had recurrence in March 2008, with surgery then by Dr Margot Chimes for 4 cm mass along colon and left pelvic node. (That pathology was ER PR negative.) Patient recalls that only symptom of recurrence was burning discomfort in LLQ. She had additional 6 cycles of taxol carboplatin from April - July 2008 and has had no known active disease since then. Per patient, ca 2729 marker was elevated with the recurrent ovarian cancer, with normal ca125; that information not available in this EMR.  CURRENT THERAPY: Surveillance  INTERVAL HISTORY: [name] returns for follow-up as scheduled.  She brought her husband because she is losing her short-term memory.  This is being worked up by PCP. She was last seen by Dr. Burr Medico on 12/29/2018.  Annual mammogram on 03/20/2019 was negative.  She has normal appetite and energy lately, denies unintentional weight loss.  Denies concerns in her breast or chest wall such as new lump/bump.  Denies new bone pain, abdominal pain or bloating.  Denies vaginal or other bleeding.  Denies  change in her bowel habits.  Denies recent fever, chills, cough, chest pain, dyspnea, leg edema.   MEDICAL HISTORY:  Past Medical History:  Diagnosis Date  . BRCA1 positive 03/21/2014  . 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 History of malignant neoplasm of breast Approx. 1993   Adenocarcinoma  . History of malignant neoplasm of ovary 1998 and 2004  . Hypothyroidism 05/11/2011  . 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 Peripheral neuropathy 09/06/2012  . Syncopal episodes 08/04/2018  . Vitamin D deficiency     SURGICAL HISTORY: Past Surgical History:  Procedure Laterality Date  . BIOPSY THYROID     for benign disease  . BREAST SURGERY    . CHOLECYSTECTOMY    . COLON SURGERY     colectomy for cancer  . colonscopy  2004   2/08  . left ankle fracture     status post surgery, March 2010  . MASTECTOMY  1992   left mastectomy  . PORT-A-CATH REMOVAL Right 04/15/2015   Procedure: REMOVAL PORT-A-CATH;  Surgeon: [name], MD;  Location: [facility];  Service: General;  Laterality: Right;  Right upper chest  . TOTAL ABDOMINAL HYSTERECTOMY W/ BILATERAL SALPINGOOPHORECTOMY  1998   TAH for ovarian cancer    I have reviewed the social history and family history with the patient and they are unchanged from previous note.  ALLERGIES:  is allergic to codeine sulfate and promethazine hcl.  MEDICATIONS:  Current Outpatient Medications  Medication Sig Dispense Refill  . Aspirin-Acetaminophen-Caffeine (EXCEDRIN MIGRAINE PO) Take 1 tablet  by mouth daily as needed (headache).    . donepezil (ARICEPT) 10 MG tablet Take 1 tablet (10 mg total) by mouth at bedtime. 90 tablet 3  . estradiol (ESTRACE) 1 MG tablet TAKE 1 TABLET EVERY DAY (Patient taking differently: Take 0.5 mg by mouth at bedtime. ) 90 tablet 3  . levothyroxine (SYNTHROID) 88 MCG tablet Take 1 tablet (88 mcg total) by mouth at bedtime. 90 tablet 3  . Multiple Vitamin  (MULTIVITAMIN WITH MINERALS) TABS tablet Take 1 tablet by mouth at bedtime.    Marland Kitchen OVER THE COUNTER MEDICATION Apply 1 application topically at bedtime as needed (foot burning/neuropathy). Over the counter cream for neuropathy    . Polyvinyl Alcohol-Povidone (REFRESH OP) Place 1 drop into both eyes daily as needed (dry eyes/itching).     No current facility-administered medications for this visit.    PHYSICAL EXAMINATION: ECOG PERFORMANCE STATUS: 0 - Asymptomatic  Vitals:   12/28/19 1248  BP: 139/60  Pulse: 68  Resp: 18  Temp: 97.8 F (36.6 C)  SpO2: 96%   Filed Weights   12/28/19 1248  Weight: 152 lb 3.2 oz (69 kg)    GENERAL:alert, no distress and comfortable SKIN: Few scattered maculopapular areas to right chest EYES:  sclera clear NECK: Without mass LYMPH:  no palpable cervical or supraclavicular lymphadenopathy  LUNGS: clear with normal breathing effort HEART: regular rate & rhythm, no lower extremity edema ABDOMEN:abdomen soft, non-tender and normal bowel sounds.  No distention or palpable masses Musculoskeletal: Full range of motion to upper extremities NEURO: alert & oriented x 3 with fluent speech, no focal motor/sensory deficits Breast: S/p left mastectomy and bilateral reconstruction.  No palpable mass, nodule or adenopathy bilaterally   LABORATORY DATA:  I have reviewed the data as listed CBC Latest Ref Rng & Units 12/28/2019 12/29/2018 08/04/2018  WBC 4.0 - 10.5 K/uL 5.7 6.8 4.9  Hemoglobin 12.0 - 15.0 g/dL 15.5(H) 16.1(H) 15.0  Hematocrit 36 - 46 % 46.5(H) 49.4(H) 44.4  Platelets 150 - 400 K/uL 211 196 287.0     CMP Latest Ref Rng & Units 12/28/2019 12/29/2018 08/04/2018  Glucose 70 - 99 mg/dL 93 79 77  BUN 8 - 23 mg/dL '11 18 17  ' Creatinine 0.44 - 1.00 mg/dL 0.93 0.97 0.93  Sodium 135 - 145 mmol/L 142 141 140  Potassium 3.5 - 5.1 mmol/L 3.5 3.6 3.7  Chloride 98 - 111 mmol/L 108 107 104  CO2 22 - 32 mmol/L '26 24 28  ' Calcium 8.9 - 10.3 mg/dL 9.9 9.4 9.5  Total  Protein 6.5 - 8.1 g/dL 7.2 7.2 6.8  Total Bilirubin 0.3 - 1.2 mg/dL 0.5 0.2(L) 0.4  Alkaline Phos 38 - 126 U/L 96 127(H) 95  AST 15 - 41 U/L 14(L) 13(L) 10  ALT 0 - 44 U/L '16 16 12      ' RADIOGRAPHIC STUDIES: I have personally reviewed the radiological images as listed and agreed with the findings in the report. No results found.   ASSESSMENT & PLAN: [name] is a 79 y.o. female with   1. History of Left Breast DCIS -S/pleft mastectomy and 13 node axillary dissection and bilateral reconstruction -On breast cancer surveillance with annual mammogram and possible addition of screening breast MRI 6 months apart -mammogram in 02/2019 was negative. Her last breast MRI was in 2017, I recommend to repeat in the spring, she declined for now -Continue surveillance  2. History of Ovarian Carcinoma, recurrent, NED now  -She initially diagnosed with stage IIIC  in 1998 and pelvic recurrence in 2008, s/p surgery and chemo  - Last surveillance PET scan in10/2013 showed no evidence of disease. -She has not had a recent pelvic exam, she switched to a new PCP, does not have OB anymore.  Her next PCP visit is in 6 months, I recommend to have a pelvic exam done then, patient agrees -Ca 125 is pending from today  3.BRCA 1+ -Pt is BRCA 1 mutation positive -Due to her advanced age, right prophylactic mastectomy was not recommended. -She is compliant with annual mammogram, last breast MRI done in 2017.  I recommend for her to have it done in 2022  4. 20+years on supplemental estrogen, estradiol -I again reviewed the risk for breast and GYN cancers on supplemental estrogen.  She is down to half tab nightly.  She continues because she does not tolerate hot flashes.   -I recommend to at least try taking half tab every other night if she is not willing to stop altogether, she agrees to try  5. Dementia  -She has developed mild memory issue, mostly with short-term memory. Currently on  donepezil, she is functioningwell at home but memory loss is progressing -Continue follow-up with PCP  6. 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  -Continue follow-up with Derm  Disposition: [name] is clinically doing well.  Breast exam is unremarkable.  Pelvic exam not done.  CBC shows mildly elevated H/H likely related to dehydration, CMP normal.  We will follow-up on the outstanding Ca 125 from today.  No clinical concern for breast or ovarian cancer recurrence.   She will continue surveillance, next mammogram due in 02/2020.  I recommend a screening breast MRI which we can do 6 months after mammogram, given her BRCA 1 mutation.  She declined for now.  I recommend to continue follow-up with PCP every 6 months who can perform pelvic exam.  Patient prefers to follow-up in our clinic, we will see her back for lab and follow-up in 1 year.   Orders Placed This Encounter  Procedures  . MM DIGITAL SCREENING W/ IMPLANTS UNI R    Standing Status:   Future    Standing Expiration Date:   12/27/2020    Order Specific Question:   Reason for Exam (SYMPTOM  OR DIAGNOSIS REQUIRED)    Answer:   History of left breast DCIS, BRCA1 positive    Order Specific Question:   Preferred imaging location?    Answer:   [facility]   All questions were answered. The patient knows to call the clinic with any problems, questions or concerns. No barriers to learning were detected.     Alla Feeling, NP 12/28/19

## 2019-12-28 ENCOUNTER — Inpatient Hospital Stay: Payer: Medicare HMO | Attending: Nurse Practitioner

## 2019-12-28 ENCOUNTER — Other Ambulatory Visit: Payer: Self-pay

## 2019-12-28 ENCOUNTER — Inpatient Hospital Stay (HOSPITAL_BASED_OUTPATIENT_CLINIC_OR_DEPARTMENT_OTHER): Payer: Medicare HMO | Admitting: Nurse Practitioner

## 2019-12-28 ENCOUNTER — Encounter: Payer: Self-pay | Admitting: Nurse Practitioner

## 2019-12-28 VITALS — BP 139/60 | HR 68 | Temp 97.8°F | Resp 18 | Ht 68.0 in | Wt 152.2 lb

## 2019-12-28 DIAGNOSIS — Z90722 Acquired absence of ovaries, bilateral: Secondary | ICD-10-CM | POA: Diagnosis not present

## 2019-12-28 DIAGNOSIS — Z8543 Personal history of malignant neoplasm of ovary: Secondary | ICD-10-CM | POA: Diagnosis not present

## 2019-12-28 DIAGNOSIS — Z9011 Acquired absence of right breast and nipple: Secondary | ICD-10-CM | POA: Insufficient documentation

## 2019-12-28 DIAGNOSIS — Z8041 Family history of malignant neoplasm of ovary: Secondary | ICD-10-CM | POA: Diagnosis not present

## 2019-12-28 DIAGNOSIS — Z79899 Other long term (current) drug therapy: Secondary | ICD-10-CM | POA: Diagnosis not present

## 2019-12-28 DIAGNOSIS — Z86000 Personal history of in-situ neoplasm of breast: Secondary | ICD-10-CM

## 2019-12-28 DIAGNOSIS — Z85828 Personal history of other malignant neoplasm of skin: Secondary | ICD-10-CM | POA: Insufficient documentation

## 2019-12-28 DIAGNOSIS — Z9079 Acquired absence of other genital organ(s): Secondary | ICD-10-CM | POA: Insufficient documentation

## 2019-12-28 DIAGNOSIS — Z1501 Genetic susceptibility to malignant neoplasm of breast: Secondary | ICD-10-CM | POA: Diagnosis not present

## 2019-12-28 DIAGNOSIS — Z90710 Acquired absence of both cervix and uterus: Secondary | ICD-10-CM | POA: Diagnosis not present

## 2019-12-28 DIAGNOSIS — F0390 Unspecified dementia without behavioral disturbance: Secondary | ICD-10-CM | POA: Insufficient documentation

## 2019-12-28 LAB — CBC WITH DIFFERENTIAL/PLATELET
Abs Immature Granulocytes: 0.02 10*3/uL (ref 0.00–0.07)
Basophils Absolute: 0.1 10*3/uL (ref 0.0–0.1)
Basophils Relative: 1 %
Eosinophils Absolute: 0.2 10*3/uL (ref 0.0–0.5)
Eosinophils Relative: 3 %
HCT: 46.5 % — ABNORMAL HIGH (ref 36.0–46.0)
Hemoglobin: 15.5 g/dL — ABNORMAL HIGH (ref 12.0–15.0)
Immature Granulocytes: 0 %
Lymphocytes Relative: 23 %
Lymphs Abs: 1.3 10*3/uL (ref 0.7–4.0)
MCH: 31 pg (ref 26.0–34.0)
MCHC: 33.3 g/dL (ref 30.0–36.0)
MCV: 93 fL (ref 80.0–100.0)
Monocytes Absolute: 0.5 10*3/uL (ref 0.1–1.0)
Monocytes Relative: 9 %
Neutro Abs: 3.6 10*3/uL (ref 1.7–7.7)
Neutrophils Relative %: 64 %
Platelets: 211 10*3/uL (ref 150–400)
RBC: 5 MIL/uL (ref 3.87–5.11)
RDW: 12 % (ref 11.5–15.5)
WBC: 5.7 10*3/uL (ref 4.0–10.5)
nRBC: 0 % (ref 0.0–0.2)

## 2019-12-28 LAB — COMPREHENSIVE METABOLIC PANEL
ALT: 16 U/L (ref 0–44)
AST: 14 U/L — ABNORMAL LOW (ref 15–41)
Albumin: 3.7 g/dL (ref 3.5–5.0)
Alkaline Phosphatase: 96 U/L (ref 38–126)
Anion gap: 8 (ref 5–15)
BUN: 11 mg/dL (ref 8–23)
CO2: 26 mmol/L (ref 22–32)
Calcium: 9.9 mg/dL (ref 8.9–10.3)
Chloride: 108 mmol/L (ref 98–111)
Creatinine, Ser: 0.93 mg/dL (ref 0.44–1.00)
GFR calc Af Amer: >60 mL/min (ref >60)
GFR calc non Af Amer: 58 mL/min — ABNORMAL LOW (ref >60)
Glucose, Bld: 93 mg/dL (ref 70–99)
Potassium: 3.5 mmol/L (ref 3.5–5.1)
Sodium: 142 mmol/L (ref 135–145)
Total Bilirubin: 0.5 mg/dL (ref 0.3–1.2)
Total Protein: 7.2 g/dL (ref 6.5–8.1)

## 2019-12-29 ENCOUNTER — Telehealth: Payer: Self-pay | Admitting: Nurse Practitioner

## 2019-12-29 LAB — CA 125: Cancer Antigen (CA) 125: 9.9 U/mL (ref 0.0–38.1)

## 2019-12-29 NOTE — Telephone Encounter (Signed)
Scheduled per 8/5 los. Mailing pt appt calendar.

## 2020-01-03 ENCOUNTER — Other Ambulatory Visit: Payer: Self-pay | Admitting: Nurse Practitioner

## 2020-01-03 DIAGNOSIS — Z1231 Encounter for screening mammogram for malignant neoplasm of breast: Secondary | ICD-10-CM

## 2020-02-13 ENCOUNTER — Other Ambulatory Visit: Payer: Self-pay

## 2020-02-13 MED ORDER — LEVOTHYROXINE SODIUM 88 MCG PO TABS: 88.0000 ug | ORAL_TABLET | Freq: Every day | ORAL | 3 refills | Status: DC

## 2020-03-06 ENCOUNTER — Ambulatory Visit: Payer: Medicare HMO | Admitting: Neurology

## 2020-03-06 ENCOUNTER — Encounter: Payer: Self-pay | Admitting: Neurology

## 2020-03-06 ENCOUNTER — Other Ambulatory Visit: Payer: Self-pay

## 2020-03-06 VITALS — BP 135/79 | HR 62 | Ht 68.0 in | Wt 147.0 lb

## 2020-03-06 DIAGNOSIS — G3184 Mild cognitive impairment, so stated: Secondary | ICD-10-CM

## 2020-03-06 MED ORDER — DONEPEZIL HCL 10 MG PO TABS: 10.0000 mg | ORAL_TABLET | Freq: Every day | ORAL | 3 refills | Status: DC

## 2020-03-06 NOTE — Progress Notes (Signed)
NEUROLOGY FOLLOW UP OFFICE NOTE  [name] [id number] 11-27-1940  HISTORY OF PRESENT ILLNESS: I had the pleasure of seeing [name] in follow-up in the neurology clinic on 03/06/2020.  The patient was last seen 7 months ago for memory loss. She is again accompanied by her significant other [name] who helps supplement the history today.  Records and images were personally reviewed where available.  She underwent Neuropsychological testing in June 2021 with a diagnosis of Mild Neurocognitive Disorder, amnestic subtype. She had primary deficits with retrieval/consolidation aspects of both verbal and visual memory. Etiology unclear, amnestic presentation increases risk for Alzheimer's disease. She is on Donepezil 80m daily without side effects. She feels her memory is the same. FPilar Platefeels it is noticeable a little worse, she cannot remember things but would recall when prodded. For instance she could not recall the movie they watched 2 nights ago, then after he described it she remembered. She drives minimally without issues. She denies missing medications or bill payments. She has not left the stove on. She denies any headaches, dizziness, focal numbness/tingling/weakness, no falls. She feels a little more depressed, she has lost interest in reading. No increased irritability, hallucinations, or paranoia. She did not sleep well last night.    History on Initial Assessment 10/24/2018: This is a 79year old right-handed woman with a history of hypothyroidism, ovarian cancer, breast cancer, presenting for evaluation memory loss. Her significant other FPilar Plateis present during the e-visit to provide additional information. She had 2 syncopal episodes in one day last March 2020, and on follow-up with her PCP, memory issues were brought up. She has been taking Donepezil 145msince 2018 previously prescribed by Dr. KwBurnice LoganShe initially did not remember the syncopal episode in March, but on  further questioning, she recalls she was at the check out aisle and did not feel good at that time and woke up on the ground, no tongue bite or incontinence. She apparently passed out twice in the store. She states that she was pretty sure it occurred because she was starving. Bloodwork, EKG, and head CT were unremarkable, no further similar spells since then. She feels her memory is "not good," but she states she is doing fine living alone. FrPilar Plateives a mile away and sees her everyday. He has known her for over 30 years and started notice short-term memory issues worse in the past year, for instance she did not remember the movie they watched last night. He states she has not gotten lost driving, and she has managed to stay on top of her bills and medications. She denies any difficulties as well. She denies leaving the stove on or faucet running. There is no family history of dementia, she denies any history of significant head injuries, she rarely drinks alcohol.  She denies any headaches, dizziness, vision changes, focal numbness/tingling/weakness, neck/back pain, bowel/bladder dysfunction, anosmia, or tremors. Sleep has always been a problem. No wandering behavior, paranoia, or hallucinations. FrPilar Plateenies any staring/unresponsive episodes. She denies any olfactory/gustatory hallucinations, rising epigastric sensation, myoclonic jerks. [place].   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  PAST MEDICAL HISTORY: Past Medical History:  Diagnosis Date  . BRCA1 positive 03/21/2014  . 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 History of malignant neoplasm of breast Approx.  1993   Adenocarcinoma  . History of malignant neoplasm of ovary 1998 and 2004  . Hypothyroidism 05/11/2011  . Mild neurocognitive  disorder, amnestic type 10/20/2019  . Peripheral neuropathy 09/06/2012  . Syncopal episodes 08/04/2018  . Vitamin D deficiency     MEDICATIONS: Current Outpatient Medications on File Prior to Visit  Medication Sig Dispense Refill  . Aspirin-Acetaminophen-Caffeine (EXCEDRIN MIGRAINE PO) Take 1 tablet by mouth daily as needed (headache).    . donepezil (ARICEPT) 10 MG tablet Take 1 tablet (10 mg total) by mouth at bedtime. 90 tablet 3  . estradiol (ESTRACE) 1 MG tablet TAKE 1 TABLET EVERY DAY (Patient taking differently: Take 0.5 mg by mouth at bedtime. ) 90 tablet 3  . levothyroxine (SYNTHROID) 88 MCG tablet Take 1 tablet (88 mcg total) by mouth at bedtime. 90 tablet 3  . Multiple Vitamin (MULTIVITAMIN WITH MINERALS) TABS tablet Take 1 tablet by mouth at bedtime.    Marland Kitchen OVER THE COUNTER MEDICATION Apply 1 application topically at bedtime as needed (foot burning/neuropathy). Over the counter cream for neuropathy    . Polyvinyl Alcohol-Povidone (REFRESH OP) Place 1 drop into both eyes daily as needed (dry eyes/itching).     No current facility-administered medications on file prior to visit.    ALLERGIES: Allergies  Allergen Reactions  . Codeine Sulfate Nausea And Vomiting    Projectile vomiting  . Promethazine Hcl Other (See Comments)    Unknown reaction    FAMILY HISTORY: Family History  Problem Relation Age of Onset  . Cancer Mother        ovarian; history of melanoma   . Aneurysm Father        abdominal aortic  . Heart disease Father   . Diverticulitis Brother   . Breast cancer Maternal Aunt     SOCIAL HISTORY: Social History   Socioeconomic History  . Marital status: Divorced    Spouse name: Not on file  . Number of children: 2  . Years of education: 61  . Highest education level: Master's degree (e.g., MA, MS, MEng, MEd, MSW, MBA)  Occupational History  . Occupation: Retired  Tobacco Use  . Smoking status: Former Smoker    Quit date: 05/26/1991    Years since  quitting: 28.8  . Smokeless tobacco: Never Used  Vaping Use  . Vaping Use: Never used  Substance and Sexual Activity  . Alcohol use: Not Currently    Comment: social  . Drug use: No  . Sexual activity: Not on file  Other Topics Concern  . Not on file  Social History Narrative   March 1998- history of stage III ovarian cancer; status post suboptimal debulking; status post 6 cycles of Taxoil and carboplatin with complete remission documented by second look surgery September 1998.      Recurrent ovarian cancer, status post 6 cycles carbo and Taxol, April 1998 until July 2008      1992, left breast intraductal carcinoma (DCIS)   Status post left modified radial mastectomy and with silicon implant   13 of 13 lymph modes negative; positive for BCRA1 gene   Recurrent ovarian carinoma, February 2008   Admit Feb. 2009 partial small bowel obstruction treated medically      Right handed    Social Determinants of Health   Financial Resource Strain: Low Risk   . Difficulty of Paying Living Expenses: Not hard at all  Food Insecurity: No Food Insecurity  . Worried About Charity fundraiser in the Last Year:  Never true  . Ran Out of Food in the Last Year: Never true  Transportation Needs: No Transportation Needs  . Lack of Transportation (Medical): No  . Lack of Transportation (Non-Medical): No  Physical Activity: Inactive  . Days of Exercise per Week: 0 days  . Minutes of Exercise per Session: 0 min  Stress: No Stress Concern Present  . Feeling of Stress : Not at all  Social Connections: Moderately Isolated  . Frequency of Communication with Friends and Family: More than three times a week  . Frequency of Social Gatherings with Friends and Family: Once a week  . Attends Religious Services: Never  . Active Member of Clubs or Organizations: No  . Attends Archivist Meetings: Never  . Marital Status: Living with partner  Intimate Partner Violence: Not At Risk  . Fear of Current  or Ex-Partner: No  . Emotionally Abused: No  . Physically Abused: No  . Sexually Abused: No     PHYSICAL EXAM: Vitals:   03/06/20 1423  BP: 135/79  Pulse: 62  SpO2: 95%   General: No acute distress Head:  Normocephalic/atraumatic Skin/Extremities: No rash, no edema Neurological Exam: alert and awake. No aphasia or dysarthria. Fund of knowledge is appropriate.  Recent and remote memory are intact.  Attention and concentration are normal.   Cranial nerves: Pupils equal, round. Extraocular movements intact with no nystagmus. Visual fields full.  No facial asymmetry.  Motor: Bulk and tone normal, muscle strength 5/5 throughout with no pronator drift. Sensation intact to cold, vibration. Reflexes asymmetric, brisk +2 on right UE and LE, +1 on left. Negative Hoffman sign. Finger to nose testing intact.  Gait narrow-based and steady, no ataxia.  Romberg negative.   IMPRESSION: This is an 79 yo RH woman with a history of hypothyroidism, breast cancer, ovarian cancer, with Mild Cognitive Impairment. 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 Continue Donepezil 68m daily. Repeat Neuropsych testing will be ordered for June 2022 to assess trajectory. We discussed how mood can affect cognition, continue to monitor, SSRI was discussed, she would like to think about and will call our office if she would like to proceed. We discussed the importance of control of vascular risk factors, physical exercise, and brain stimulation exercises for brain health. Continue to monitor driving. Follow-up after repeat Neuropsych testing, they know to call for any changes.   Thank you for allowing me to participate in her care.  Please do not hesitate to call for any questions or concerns.   KEllouise Newer M.D.   CC: Dr. CBryan Lemma

## 2020-03-06 NOTE — Patient Instructions (Signed)
1. Continue Donepezil (Aricept) 10mg  daily  2. Call our office if you would like to start a medication to help with mood  3. Schedule repeat Neurocognitive testing for June 2022  4. Follow-up after testing, call for any changes   FALL PRECAUTIONS: Be cautious when walking. Scan the area for obstacles that may increase the risk of trips and falls. When getting up in the mornings, sit up at the edge of the bed for a few minutes before getting out of bed. Consider elevating the bed at the head end to avoid drop of blood pressure when getting up. Walk always in a well-lit room (use night lights in the walls). Avoid area rugs or power cords from appliances in the middle of the walkways. Use a walker or a cane if necessary and consider physical therapy for balance exercise. Get your eyesight checked regularly.  FINANCIAL OVERSIGHT: Supervision, especially oversight when making financial decisions or transactions is also recommended as difficulties arise.  HOME SAFETY: Consider the safety of the kitchen when operating appliances like stoves, microwave oven, and blender. Consider having supervision and share cooking responsibilities until no longer able to participate in those. Accidents with firearms and other hazards in the house should be identified and addressed as well.  DRIVING: Regarding driving, in patients with progressive memory problems, driving will be impaired. We advise to have someone else do the driving if trouble finding directions or if minor accidents are reported. Independent driving assessment is available to determine safety of driving.  ABILITY TO BE LEFT ALONE: If patient is unable to contact 911 operator, consider using LifeLine, or when the need is there, arrange for someone to stay with patients. Smoking is a fire hazard, consider supervision or cessation. Risk of wandering should be assessed by caregiver and if detected at any point, supervision and safe proof recommendations  should be instituted.  MEDICATION SUPERVISION: Inability to self-administer medication needs to be constantly addressed. Implement a mechanism to ensure safe administration of the medications.  RECOMMENDATIONS FOR ALL PATIENTS WITH MEMORY PROBLEMS: 1. Continue to exercise (Recommend 30 minutes of walking everyday, or 3 hours every week) 2. Increase social interactions - continue going to [place] and enjoy social gatherings with friends and family 3. Eat healthy, avoid fried foods and eat more fruits and vegetables 4. Maintain adequate blood pressure, blood sugar, and blood cholesterol level. Reducing the risk of stroke and cardiovascular disease also helps promoting better memory. 5. Avoid stressful situations. Live a simple life and avoid aggravations. Organize your time and prepare for the next day in anticipation. 6. Sleep well, avoid any interruptions of sleep and avoid any distractions in the bedroom that may interfere with adequate sleep quality 7. Avoid sugar, avoid sweets as there is a strong link between excessive sugar intake, diabetes, and cognitive impairment We discussed the Mediterranean diet, which has been shown to help patients reduce the risk of progressive memory disorders and reduces cardiovascular risk. This includes eating fish, eat fruits and green leafy vegetables, nuts like almonds and hazelnuts, walnuts, and also use olive oil. Avoid fast foods and fried foods as much as possible. Avoid sweets and sugar as sugar use has been linked to worsening of memory function.  There is always a concern of gradual progression of memory problems. If this is the case, then we may need to adjust level of care according to patient needs. Support, both to the patient and caregiver, should then be put into place.

## 2020-03-21 ENCOUNTER — Other Ambulatory Visit: Payer: Self-pay

## 2020-03-21 ENCOUNTER — Ambulatory Visit
Admission: RE | Admit: 2020-03-21 | Discharge: 2020-03-21 | Disposition: A | Discharge status: Home / Self Care | Payer: Medicare HMO | Source: Ambulatory Visit | Attending: Nurse Practitioner | Admitting: Nurse Practitioner

## 2020-03-21 DIAGNOSIS — Z1231 Encounter for screening mammogram for malignant neoplasm of breast: Secondary | ICD-10-CM

## 2020-03-25 DIAGNOSIS — L821 Other seborrheic keratosis: Secondary | ICD-10-CM | POA: Diagnosis not present

## 2020-03-25 DIAGNOSIS — L814 Other melanin hyperpigmentation: Secondary | ICD-10-CM | POA: Diagnosis not present

## 2020-03-25 DIAGNOSIS — Z85828 Personal history of other malignant neoplasm of skin: Secondary | ICD-10-CM | POA: Diagnosis not present

## 2020-03-25 DIAGNOSIS — C44622 Squamous cell carcinoma of skin of right upper limb, including shoulder: Secondary | ICD-10-CM | POA: Diagnosis not present

## 2020-08-21 DIAGNOSIS — Z961 Presence of intraocular lens: Secondary | ICD-10-CM | POA: Diagnosis not present

## 2020-08-21 DIAGNOSIS — H5212 Myopia, left eye: Secondary | ICD-10-CM | POA: Diagnosis not present

## 2020-08-21 DIAGNOSIS — H524 Presbyopia: Secondary | ICD-10-CM | POA: Diagnosis not present

## 2020-09-23 DIAGNOSIS — Z85828 Personal history of other malignant neoplasm of skin: Secondary | ICD-10-CM | POA: Diagnosis not present

## 2020-09-23 DIAGNOSIS — D1801 Hemangioma of skin and subcutaneous tissue: Secondary | ICD-10-CM | POA: Diagnosis not present

## 2020-09-23 DIAGNOSIS — L821 Other seborrheic keratosis: Secondary | ICD-10-CM | POA: Diagnosis not present

## 2020-09-23 DIAGNOSIS — L814 Other melanin hyperpigmentation: Secondary | ICD-10-CM | POA: Diagnosis not present

## 2020-09-23 DIAGNOSIS — L570 Actinic keratosis: Secondary | ICD-10-CM | POA: Diagnosis not present

## 2020-10-25 ENCOUNTER — Ambulatory Visit (INDEPENDENT_AMBULATORY_CARE_PROVIDER_SITE_OTHER): Payer: Medicare HMO | Admitting: Psychology

## 2020-10-25 DIAGNOSIS — G3184 Mild cognitive impairment, so stated: Secondary | ICD-10-CM

## 2020-10-25 NOTE — Progress Notes (Signed)
   NEUROPSYCHOLOGICAL EVALUATION . [place] Department of Neurology     Ms. Kowalski called the office, ultimately leaving a voicemail cancelling her appointment this afternoon stating that it was not necessary. She was later reached and confirmed her desire to cancel. As such, she will not be seen for a repeat evaluation at the present time.

## 2020-10-31 ENCOUNTER — Encounter: Payer: Medicare HMO | Admitting: Psychology

## 2020-11-01 ENCOUNTER — Telehealth: Payer: Self-pay | Admitting: Neurology

## 2020-11-01 NOTE — Telephone Encounter (Signed)
[name] called and left a VM and states that he is concerned about patient and what is going with the patient. Please call

## 2020-11-04 NOTE — Telephone Encounter (Signed)
Spoke with [name] [name] significant other he stated that [name] has had a big change in her memory she had to change her appointment with Dr Delice Lesch and Dr Melvyn Novas due to being Sick her appointment with Dr Melvyn Novas is in Sept and with Dr Delice Lesch is now in Jan. They are asking for sooner appointment for both, I told them we can place her on a waiting list for a sooner appointment for Dr Melvyn Novas and Dr Delice Lesch, or I can see if she may be able to see our [place] for a sooner appointment. If she is a [name] that [name] an see they are ok with seeing [name] now and keeping Jessica Jan appointment to follow up with Dr Delice Lesch. [name] was advised that if she can see [name] then Jessica front staff will call him to set up an appointment.

## 2020-11-04 NOTE — Telephone Encounter (Signed)
Patient can be scheduled with [name] this week for earlier appt, keep Jan with me, and Neuropsych with Dr. Melvyn Novas. Thanks

## 2020-11-06 ENCOUNTER — Other Ambulatory Visit: Payer: Self-pay

## 2020-11-06 ENCOUNTER — Encounter: Payer: Self-pay | Admitting: Physician Assistant

## 2020-11-06 ENCOUNTER — Ambulatory Visit (INDEPENDENT_AMBULATORY_CARE_PROVIDER_SITE_OTHER): Payer: Medicare HMO | Admitting: Physician Assistant

## 2020-11-06 VITALS — BP 110/67 | HR 67 | Ht 68.0 in | Wt 144.2 lb

## 2020-11-06 DIAGNOSIS — G3184 Mild cognitive impairment, so stated: Secondary | ICD-10-CM | POA: Diagnosis not present

## 2020-11-06 MED ORDER — DONEPEZIL HCL 10 MG PO TABS: 10.0000 mg | ORAL_TABLET | Freq: Every day | ORAL | 3 refills | Status: DC

## 2020-11-06 NOTE — Patient Instructions (Addendum)
It was a pleasure to see you today at our office.   Recommendations:  Keep the appointment with [name] in January Keep the appointment in 9/7 with Dr. Melvyn Novas as scheduled Keep taking Donepezil 10 mg daily as before    RECOMMENDATIONS FOR ALL PATIENTS WITH MEMORY PROBLEMS: 1. Continue to exercise (Recommend 30 minutes of walking everyday, or 3 hours every week) 2. Increase social interactions - continue going to [place] and enjoy social gatherings with friends and family 3. Eat healthy, avoid fried foods and eat more fruits and vegetables 4. Maintain adequate blood pressure, blood sugar, and blood cholesterol level. Reducing the risk of stroke and cardiovascular disease also helps promoting better memory. 5. Avoid stressful situations. Live a simple life and avoid aggravations. Organize your time and prepare for the next day in anticipation. 6. Sleep well, avoid any interruptions of sleep and avoid any distractions in the bedroom that may interfere with adequate sleep quality 7. Avoid sugar, avoid sweets as there is a strong link between excessive sugar intake, diabetes, and cognitive impairment We discussed the Mediterranean diet, which has been shown to help patients reduce the risk of progressive memory disorders and reduces cardiovascular risk. This includes eating fish, eat fruits and green leafy vegetables, nuts like almonds and hazelnuts, walnuts, and also use olive oil. Avoid fast foods and fried foods as much as possible. Avoid sweets and sugar as sugar use has been linked to worsening of memory function.  There is always a concern of gradual progression of memory problems. If this is the case, then we may need to adjust level of care according to patient needs. Support, both to the patient and caregiver, should then be put into place.      You have been referred for a neuropsychological evaluation (i.e., evaluation of memory and thinking abilities). Please bring someone with you to  this appointment if possible, as it is helpful for the doctor to hear from both you and another adult who knows you well. Please bring eyeglasses and hearing aids if you wear them.    The evaluation will take approximately 3 hours and has two parts:   The first part is a clinical interview with the neuropsychologist (Dr. Melvyn Novas or Dr. Nicole Kindred). During the interview, the neuropsychologist will speak with you and the individual you brought to the appointment.    The second part of the evaluation is testing with the doctor's technician Hinton Dyer or Maudie Mercury). During the testing, the technician will ask you to remember different types of material, solve problems, and answer some questionnaires. Your family member will not be present for this portion of the evaluation.   Please note: We must reserve several hours of the neuropsychologist's time and the psychometrician's time for your evaluation appointment. As such, there is a No-Show fee of $100. If you are unable to attend any of your appointments, please contact our office as soon as possible to reschedule.    FALL PRECAUTIONS: Be cautious when walking. Scan the area for obstacles that may increase the risk of trips and falls. When getting up in the mornings, sit up at the edge of the bed for a few minutes before getting out of bed. Consider elevating the bed at the head end to avoid drop of blood pressure when getting up. Walk always in a well-lit room (use night lights in the walls). Avoid area rugs or power cords from appliances in the middle of the walkways. Use a walker or a cane if necessary  and consider physical therapy for balance exercise. Get your eyesight checked regularly.  FINANCIAL OVERSIGHT: Supervision, especially oversight when making financial decisions or transactions is also recommended.  HOME SAFETY: Consider the safety of the kitchen when operating appliances like stoves, microwave oven, and blender. Consider having supervision and share  cooking responsibilities until no longer able to participate in those. Accidents with firearms and other hazards in the house should be identified and addressed as well.   ABILITY TO BE LEFT ALONE: If patient is unable to contact 911 operator, consider using LifeLine, or when the need is there, arrange for someone to stay with patients. Smoking is a fire hazard, consider supervision or cessation. Risk of wandering should be assessed by caregiver and if detected at any point, supervision and safe proof recommendations should be instituted.  MEDICATION SUPERVISION: Inability to self-administer medication needs to be constantly addressed. Implement a mechanism to ensure safe administration of the medications.   DRIVING: Regarding driving, in patients with progressive memory problems, driving will be impaired. We advise to have someone else do the driving if trouble finding directions or if minor accidents are reported. Independent driving assessment is available to determine safety of driving.   If you are interested in the driving assessment, you can contact the following:  [employer] in [place]  [place] [phone] or [phone]    [place] refers to food and lifestyle choices that are based on the traditions of countries located on the [employer]. This way of eating has been shown to help prevent certain conditions and improve outcomes for people who have chronic diseases, like kidney disease and heart disease. What are tips for following this plan? Lifestyle  Cook and eat meals together with your family, when possible. Drink enough fluid to keep your urine clear or pale yellow. Be physically active every day. This includes: Aerobic exercise like running or swimming. Leisure activities like gardening, walking, or  housework. Get 7-8 hours of sleep each night. If recommended by your health care provider, drink red wine in moderation. This means 1 glass a day for nonpregnant women and 2 glasses a day for men. A glass of wine equals 5 oz (150 mL). Reading food labels  Check the serving size of packaged foods. For foods such as rice and pasta, the serving size refers to the amount of cooked product, not dry. Check the total fat in packaged foods. Avoid foods that have saturated fat or trans fats. Check the ingredients list for added sugars, such as corn syrup. Shopping  At the grocery store, buy most of your food from the areas near the walls of the store. This includes: Fresh fruits and vegetables (produce). Grains, beans, nuts, and seeds. Some of these may be available in unpackaged forms or large amounts (in bulk). Fresh seafood. Poultry and eggs. Low-fat dairy products. Buy whole ingredients instead of prepackaged foods. Buy fresh fruits and vegetables in-season from local farmers markets. Buy frozen fruits and vegetables in resealable bags. If you do not have access to quality fresh seafood, buy precooked frozen shrimp or canned fish, such as tuna, salmon, or sardines. Buy small amounts of raw or cooked vegetables, salads, or olives from the deli or salad bar at your store. Stock your pantry so you always have certain foods on hand, such as olive oil, canned tuna, canned tomatoes, rice, pasta, and beans. Cooking  Cook foods with  extra-virgin olive oil instead of using butter or other vegetable oils. Have meat as a side dish, and have vegetables or grains as your main dish. This means having meat in small portions or adding small amounts of meat to foods like pasta or stew. Use beans or vegetables instead of meat in common dishes like chili or lasagna. Experiment with different cooking methods. Try roasting or broiling vegetables instead of steaming or sauteing them. Add frozen vegetables to soups,  stews, pasta, or rice. Add nuts or seeds for added healthy fat at each meal. You can add these to yogurt, salads, or vegetable dishes. Marinate fish or vegetables using olive oil, lemon juice, garlic, and fresh herbs. Meal planning  Plan to eat 1 vegetarian meal one day each week. Try to work up to 2 vegetarian meals, if possible. Eat seafood 2 or more times a week. Have healthy snacks readily available, such as: Vegetable sticks with hummus. Greek yogurt. Fruit and nut trail mix. Eat balanced meals throughout the week. This includes: Fruit: 2-3 servings a day Vegetables: 4-5 servings a day Low-fat dairy: 2 servings a day Fish, poultry, or lean meat: 1 serving a day Beans and legumes: 2 or more servings a week Nuts and seeds: 1-2 servings a day Whole grains: 6-8 servings a day Extra-virgin olive oil: 3-4 servings a day Limit red meat and sweets to only a few servings a month What are my food choices? Mediterranean diet Recommended Grains: Whole-grain pasta. Brown rice. Bulgar wheat. Polenta. Couscous. Whole-wheat bread. [place]. Vegetables: Artichokes. Beets. Broccoli. Cabbage. Carrots. Eggplant. Green beans. Chard. Kale. Spinach. Onions. Leeks. Peas. Squash. Tomatoes. Peppers. Radishes. Fruits: Apples. Apricots. Avocado. Berries. Bananas. Cherries. Dates. Figs. Grapes. Lemons. Melon. Oranges. Peaches. Plums. Pomegranate. Meats and other protein foods: Beans. Almonds. Sunflower seeds. Pine nuts. Peanuts. [place]. Salmon. Scallops. Shrimp. [place]. Tilapia. Clams. Oysters. Eggs. Dairy: Low-fat milk. Cheese. Greek yogurt. Beverages: Water. Red wine. Herbal tea. Fats and oils: Extra virgin olive oil. Avocado oil. Grape seed oil. Sweets and desserts: [place] yogurt with honey. Baked apples. Poached pears. Trail mix. Seasoning and other foods: Basil. Cilantro. Coriander. Cumin. Mint. Parsley. Sage. Rosemary. Tarragon. Garlic. Oregano. Thyme. Pepper. Balsalmic vinegar. Tahini. Hummus. Tomato  sauce. Olives. Mushrooms. Limit these Grains: Prepackaged pasta or rice dishes. Prepackaged cereal with added sugar. Vegetables: Deep fried potatoes (french fries). Fruits: Fruit canned in syrup. Meats and other protein foods: Beef. Pork. Lamb. Poultry with skin. Hot dogs. [place] Salines. Dairy: Ice cream. Sour cream. Whole milk. Beverages: Juice. Sugar-sweetened soft drinks. Beer. Liquor and spirits. Fats and oils: Butter. Canola oil. Vegetable oil. Beef fat (tallow). Lard. Sweets and desserts: Cookies. Cakes. Pies. Candy. Seasoning and other foods: Mayonnaise. Premade sauces and marinades. The items listed may not be a complete list. Talk with your dietitian about what dietary choices are right for you. Summary The Mediterranean diet includes both food and lifestyle choices. Eat a variety of fresh fruits and vegetables, beans, nuts, seeds, and whole grains. Limit the amount of red meat and sweets that you eat. Talk with your health care provider about whether it is safe for you to drink red wine in moderation. This means 1 glass a day for nonpregnant women and 2 glasses a day for men. A glass of wine equals 5 oz (150 mL). This information is not intended to replace advice given to you by your health care provider. Make sure you discuss any questions you have with your health care provider. Document Released: 01/02/2016 Document Revised: 02/04/2016 Document  Reviewed: 01/02/2016 Elsevier Interactive Patient Education  2017 [employer].

## 2020-11-06 NOTE — Progress Notes (Signed)
Assessment/Plan:    Mild Neurocognitive disorder, amnestic subtype, etiology unclear  Is a very pleasant 80 year old right-handed woman with risk factors including age, hypothyroidism, breast cancer, ovarian cancer, seen today in a short notice for evaluation of possible cognitive decline.  However, her MoCA today is 19/30, with most obvious deficiency on memory, and delayed recall.  Her physical exam is essentially unremarkable.  No signs of infection are noted.  She does however have not been active socially and physically due to the Covid pandemic which may have affected her emotionally.  She has a neurocognitive evaluation in September of this year, which will be revealing other causes of worsening of her status.  She is on donepezil 10 mg daily.  Recommendations:   Discussed safety both in and out of the home.  Discussed the importance of regular daily schedule with inclusion of crossword puzzles to maintain brain function.  Continue to monitor mood by PCP Stay active at least 30 minutes at least 3 times a week.  Naps should be scheduled and should be no longer than 60 minutes and should not occur after 2 PM.   Mediterranean diet is recommended  Continue donepezil 10 mg daily Side effects  were discussed, which include nausea, vomiting, diarrhea, vivid dreams, and muscle cramps.  Patient instructed to cal the clinic any of these symptoms are present.  Make sure that the patient is adherent to the medication. Refills sent to the Pharmacy  Keep the appointment with neuropsychology on 01/29/2021, Dr. Melvyn Novas Keep the appointment with [name] on 06/09/2021   Case discussed with [name] who agrees with the plan     Subjective:   [name] is a 80 y.o. female  seen today in follow up for memory loss prior to her scheduled appointment in January 2023. She was last seen on 03/06/20 at our office.  This patient is accompanied in the office by her friend [name] who supplements the  history.  Previous records as well as any outside records available were reviewed prior to todays visit.  She has a diagnosis of Mild Neurocognitive disorder, amnestic subtype, etiology unclear per Neurocognitive testing. Patient is currently on Donepezil 10 mg daily.  The patient reports that her short-term memory may be worse, especially in trying to remember new names of people, or telephone numbers.  [name] reports that "when she gets to the grocery store she forgets what she came for, or that she is unable to find the groceries while at the store".  This has been worse over the last 3 or 4 months.  He also adds that she is asking the same questions more frequently or telling the same story.  She also "loses stuff in plain sight, as for example losing her Social Security letter, or jewelry, which was in front of her ". She feels that her mood is good, but unable to do the things she likes to do because of the pandemic, has affected her lately.  She does not like to participate in activities because has to to wake up earlier than 11 AM.  She does not participate on physical exercise, or brain stimulation exercises. She has lost interest in reading. Denies hallucinations or paranoia. Denies vivid dreams or sleepwalking. Patient dresses up and bathes without help. Denies missing medications however, [name] is suspicious that she may not be adherent to donepezil.  She continues to do her finances without missing any bill payments.  She used to cook more often, but now she  barely boils eggs. She denies leaving the stove on. Appetite is good, denies trouble swallowing.  Ambulates without difficulty without walker or cane. Patient continues to drive and denies becoming lost.  Denies headaches, trauma, or injuries to the head, double vision,  dizziness, focal numbness or tingling, unilateral weakness or tremors. Denies urine incontinence or retention. Denies constipation or diarrhea.  Denies any recent  hospitalizations.    History on Initial Assessment 10/24/2018: This is a 80 year old right-handed woman with a history of hypothyroidism, ovarian cancer, breast cancer, presenting for evaluation memory loss. Her significant other [name] is present during the e-visit to provide additional information. She had 2 syncopal episodes in one day last March 2020, and on follow-up with her PCP, memory issues were brought up. She has been taking Donepezil 10mg  since 2018 previously prescribed by Dr. Burnice Logan. She initially did not remember the syncopal episode in March, but on further questioning, she recalls she was at the check out aisle and did not feel good at that time and woke up on the ground, no tongue bite or incontinence. She apparently passed out twice in the store. She states that she was pretty sure it occurred because she was starving. Bloodwork, EKG, and head CT were unremarkable, no further similar spells since then. She feels her memory is "not good," but she states she is doing fine living alone. [name] lives a mile away and sees her everyday. He has known her for over 30 years and started notice short-term memory issues worse in the past year, for instance she did not remember the movie they watched last night. He states she has not gotten lost driving, and she has managed to stay on top of her bills and medications. She denies any difficulties as well. She denies leaving the stove on or faucet running. There is no family history of dementia, she denies any history of significant head injuries, she rarely drinks alcohol.   She denies any headaches, dizziness, vision changes, focal numbness/tingling/weakness, neck/back pain, bowel/bladder dysfunction, anosmia, or tremors. Sleep has always been a problem. No wandering behavior, paranoia, or hallucinations. [name] denies any staring/unresponsive episodes. She denies any olfactory/gustatory hallucinations, rising epigastric sensation, myoclonic jerks. She  has a daughter living in [place] who she sees once a week.   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  PREVIOUS MEDICATIONS: none  CURRENT MEDICATIONS:  Outpatient Encounter Medications as of 11/06/2020  Medication Sig   Aspirin-Acetaminophen-Caffeine (EXCEDRIN MIGRAINE PO) Take 1 tablet by mouth daily as needed (headache).   levothyroxine (SYNTHROID) 88 MCG tablet Take 1 tablet (88 mcg total) by mouth at bedtime.   Multiple Vitamin (MULTIVITAMIN WITH MINERALS) TABS tablet Take 1 tablet by mouth at bedtime.   [DISCONTINUED] donepezil (ARICEPT) 10 MG tablet Take 1 tablet (10 mg total) by mouth at bedtime.   donepezil (ARICEPT) 10 MG tablet Take 1 tablet (10 mg total) by mouth at bedtime.   estradiol (ESTRACE) 1 MG tablet TAKE 1 TABLET EVERY DAY (Patient not taking: Reported on 11/06/2020)   OVER THE COUNTER MEDICATION Apply 1 application topically at bedtime as needed (foot burning/neuropathy). Over the counter cream for neuropathy (Patient not taking: Reported on 11/06/2020)   Polyvinyl Alcohol-Povidone (REFRESH OP) Place 1 drop into both eyes daily as needed (dry eyes/itching). (Patient not taking: No sig reported)   No facility-administered encounter medications on file as of 11/06/2020.  Objective:     PHYSICAL EXAMINATION:    VITALS:   Vitals:   11/06/20 1309  BP: 110/67  Pulse: 67  SpO2: 95%  Weight: 144 lb 3.2 oz (65.4 kg)  Height: 5\' 8"  (1.727 m)    GEN:  The patient appears stated age and is in NAD. Looks younger than stated age 5:  Normocephalic, atraumatic.   Neurological examination:  General: NAD, well-groomed, appears stated age. Orientation: The patient is alert. Oriented to person, place and year is 2020 and is Monday.  Cranial nerves: There is good facial symmetry.The speech is fluent and clear. No aphasia or dysarthria. Fund of  knowledge is appropriate. Recent and remote memory are impaired. Attention and concentration are reduced.  Able to name objects and repeat phrases.  Hearing is intact to conversational tone.    Sensation: Sensation is intact to light touch throughout Motor: Strength is at least antigravity x4.  Montreal Cognitive Assessment  11/06/2020 11/01/2018  Visuospatial/ Executive (0/5) 4 -  Naming (0/3) 3 -  Attention: Read list of digits (0/2) 2 2  Attention: Read list of letters (0/1) 1 0  Attention: Serial 7 subtraction starting at 100 (0/3) 2 3  Language: Repeat phrase (0/2) 2 2  Language : Fluency (0/1) 1 0  Abstraction (0/2) 2 2  Delayed Recall (0/5) 0 0  Orientation (0/6) 2 3  Total 19 -  Adjusted Score (based on education) 19 -     No flowsheet data found.    Movement examination: Tone: There is normal tone in the UE/LE Abnormal movements:  no tremor.  No myoclonus.  No asterixis.   Coordination:  There is no decremation with RAM's. Normal finger to nose  Gait and Station: The patient has no difficulty arising out of a deep-seated chair without the use of the hands. The patient's stride length is good.  Gait is cautious and narrow.    CBC CBC Latest Ref Rng & Units 12/28/2019 12/29/2018 08/04/2018  WBC 4.0 - 10.5 K/uL 5.7 6.8 4.9  Hemoglobin 12.0 - 15.0 g/dL 15.5(H) 16.1(H) 15.0  Hematocrit 36.0 - 46.0 % 46.5(H) 49.4(H) 44.4  Platelets 150 - 400 K/uL 211 196 287.0     CMP Latest Ref Rng & Units 12/28/2019 12/29/2018 08/04/2018  Glucose 70 - 99 mg/dL 93 79 77  BUN 8 - 23 mg/dL 11 18 17   Creatinine 0.44 - 1.00 mg/dL 0.93 0.97 0.93  Sodium 135 - 145 mmol/L 142 141 140  Potassium 3.5 - 5.1 mmol/L 3.5 3.6 3.7  Chloride 98 - 111 mmol/L 108 107 104  CO2 22 - 32 mmol/L 26 24 28   Calcium 8.9 - 10.3 mg/dL 9.9 9.4 9.5  Total Protein 6.5 - 8.1 g/dL 7.2 7.2 6.8  Total Bilirubin 0.3 - 1.2 mg/dL 0.5 0.2(L) 0.4  Alkaline Phos 38 - 126 U/L 96 127(H) 95  AST 15 - 41 U/L 14(L) 13(L) 10  ALT 0 - 44  U/L 16 16 12        Total time spent on today's visit was 30 minutes, including both face-to-face time and nonface-to-face time. Time included that spent on review of records (prior notes available to me/labs/imaging if pertinent), discussing treatment and goals, answering patient's questions and coordinating care.  Cc:  Ronnald Nian, DO Sharene Butters, PA-C

## 2020-11-07 ENCOUNTER — Encounter: Payer: Medicare HMO | Admitting: Psychology

## 2020-11-08 ENCOUNTER — Ambulatory Visit: Payer: Medicare HMO | Admitting: Neurology

## 2020-12-27 ENCOUNTER — Other Ambulatory Visit: Payer: Self-pay

## 2020-12-27 ENCOUNTER — Ambulatory Visit: Payer: Medicare HMO

## 2020-12-27 ENCOUNTER — Inpatient Hospital Stay: Payer: Medicare HMO

## 2020-12-27 ENCOUNTER — Inpatient Hospital Stay: Payer: Medicare HMO | Attending: Hematology | Admitting: Hematology

## 2020-12-27 VITALS — BP 132/68 | HR 67 | Temp 98.0°F | Resp 18 | Ht 68.0 in | Wt 141.7 lb

## 2020-12-27 DIAGNOSIS — N951 Menopausal and female climacteric states: Secondary | ICD-10-CM | POA: Insufficient documentation

## 2020-12-27 DIAGNOSIS — Z9079 Acquired absence of other genital organ(s): Secondary | ICD-10-CM | POA: Diagnosis not present

## 2020-12-27 DIAGNOSIS — Z85828 Personal history of other malignant neoplasm of skin: Secondary | ICD-10-CM | POA: Diagnosis not present

## 2020-12-27 DIAGNOSIS — Z1509 Genetic susceptibility to other malignant neoplasm: Secondary | ICD-10-CM

## 2020-12-27 DIAGNOSIS — Z79899 Other long term (current) drug therapy: Secondary | ICD-10-CM | POA: Insufficient documentation

## 2020-12-27 DIAGNOSIS — Z90710 Acquired absence of both cervix and uterus: Secondary | ICD-10-CM | POA: Insufficient documentation

## 2020-12-27 DIAGNOSIS — Z90722 Acquired absence of ovaries, bilateral: Secondary | ICD-10-CM | POA: Insufficient documentation

## 2020-12-27 DIAGNOSIS — Z9221 Personal history of antineoplastic chemotherapy: Secondary | ICD-10-CM | POA: Insufficient documentation

## 2020-12-27 DIAGNOSIS — Z8543 Personal history of malignant neoplasm of ovary: Secondary | ICD-10-CM | POA: Insufficient documentation

## 2020-12-27 DIAGNOSIS — Z9012 Acquired absence of left breast and nipple: Secondary | ICD-10-CM | POA: Insufficient documentation

## 2020-12-27 DIAGNOSIS — Z1501 Genetic susceptibility to malignant neoplasm of breast: Secondary | ICD-10-CM | POA: Insufficient documentation

## 2020-12-27 DIAGNOSIS — Z86000 Personal history of in-situ neoplasm of breast: Secondary | ICD-10-CM | POA: Diagnosis not present

## 2020-12-27 DIAGNOSIS — F0390 Unspecified dementia without behavioral disturbance: Secondary | ICD-10-CM | POA: Insufficient documentation

## 2020-12-27 LAB — CBC WITH DIFFERENTIAL/PLATELET
Basophils Absolute: 0.1 10*3/uL (ref 0.0–0.1)
Basophils Relative: 1 %
Eosinophils Absolute: 0.2 10*3/uL (ref 0.0–0.5)
Eosinophils Relative: 3 %
HCT: 47.1 % — ABNORMAL HIGH (ref 36.0–46.0)
Hemoglobin: 15.8 g/dL — ABNORMAL HIGH (ref 12.0–15.0)
Lymphocytes Relative: 24 %
Lymphs Abs: 1.4 10*3/uL (ref 0.7–4.0)
MCH: 31.4 pg (ref 26.0–34.0)
MCHC: 33.5 g/dL (ref 30.0–36.0)
MCV: 93.6 fL (ref 80.0–100.0)
Monocytes Absolute: 0.4 10*3/uL (ref 0.1–1.0)
Monocytes Relative: 8 %
Neutro Abs: 3.7 10*3/uL (ref 1.7–7.7)
Neutrophils Relative %: 64 %
Platelets: ADEQUATE 10*3/uL (ref 150–400)
RBC: 5.03 MIL/uL (ref 3.87–5.11)
RDW: 12.2 % (ref 11.5–15.5)
WBC: 5.8 10*3/uL (ref 4.0–10.5)
nRBC: 0 % (ref 0.0–0.2)

## 2020-12-27 LAB — COMPREHENSIVE METABOLIC PANEL
ALT: 11 U/L (ref 0–44)
AST: 11 U/L — ABNORMAL LOW (ref 15–41)
Albumin: 3.7 g/dL (ref 3.5–5.0)
Alkaline Phosphatase: 81 U/L (ref 38–126)
Anion gap: 10 (ref 5–15)
BUN: 12 mg/dL (ref 8–23)
CO2: 24 mmol/L (ref 22–32)
Calcium: 9.7 mg/dL (ref 8.9–10.3)
Chloride: 108 mmol/L (ref 98–111)
Creatinine, Ser: 1.08 mg/dL — ABNORMAL HIGH (ref 0.44–1.00)
GFR, Estimated: 52 mL/min — ABNORMAL LOW (ref >60)
Glucose, Bld: 132 mg/dL — ABNORMAL HIGH (ref 70–99)
Potassium: 3.2 mmol/L — ABNORMAL LOW (ref 3.5–5.1)
Sodium: 142 mmol/L (ref 135–145)
Total Bilirubin: 0.8 mg/dL (ref 0.3–1.2)
Total Protein: 6.8 g/dL (ref 6.5–8.1)

## 2020-12-27 NOTE — Progress Notes (Signed)
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 She was intolerant of tamoxifen. She has a subpectoral implant on right.     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 She had recurrence in March 2008, with surgery then by Dr Margot Chimes for 4 cm mass along colon and left pelvic node. (That pathology was ER PR negative.) Patient recalls that only symptom of recurrence was burning discomfort in LLQ. She had additional 6 cycles of taxol carboplatin from April - July 2008 and has had no known active disease since then. Per patient, ca 2729 marker was elevated with the recurrent ovarian cancer, with normal ca125; that information not available in this EMR.  CURRENT THERAPY:  Surveillance  INTERVAL HISTORY:  [name] is here for a follow up of BRCA1+ and h/o cancer. She was last seen by NP Lacie on 12/28/19. She presents to the clinic accompanied by a friend. She reports she is doing well overall, with no new changes or concerns. She continues to have hot flashes, for which she takes estradiol. She has taken this for many years. Her friend notes cognitive decline. She follows neurology for this.   All other systems were reviewed with the  patient and are negative.  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   Peripheral neuropathy 09/06/2012   Syncopal episodes 08/04/2018   Vitamin D deficiency     SURGICAL HISTORY: Past Surgical History:  Procedure Laterality Date   BIOPSY THYROID     for benign disease   BREAST SURGERY     CHOLECYSTECTOMY     COLON SURGERY     colectomy for cancer   colonscopy  2004   2/08   left ankle fracture     status post surgery, March 2010   MASTECTOMY  1992   left mastectomy   PORT-A-CATH REMOVAL Right 04/15/2015   Procedure: REMOVAL PORT-A-CATH;  Surgeon: [name], MD;  Location: [facility];  Service: General;  Laterality: Right;  Right upper chest   TOTAL ABDOMINAL HYSTERECTOMY W/ BILATERAL SALPINGOOPHORECTOMY  1998   TAH for ovarian cancer    I have reviewed the social history and family history with the patient and they are unchanged from previous note.  ALLERGIES:  is allergic to codeine sulfate and promethazine hcl.  MEDICATIONS:  Current Outpatient Medications  Medication Sig Dispense Refill   Aspirin-Acetaminophen-Caffeine (EXCEDRIN MIGRAINE PO) Take 1 tablet by mouth daily as needed (headache).     donepezil (  ARICEPT) 10 MG tablet Take 1 tablet (10 mg total) by mouth at bedtime. 90 tablet 3   estradiol (ESTRACE) 1 MG tablet TAKE 1 TABLET EVERY DAY (Patient not taking: Reported on 11/06/2020) 90 tablet 3   levothyroxine (SYNTHROID) 88 MCG tablet Take 1 tablet (88 mcg total) by mouth at bedtime. 90 tablet 3   Multiple Vitamin (MULTIVITAMIN WITH MINERALS) TABS tablet Take 1 tablet by mouth at bedtime.     OVER THE COUNTER MEDICATION Apply 1  application topically at bedtime as needed (foot burning/neuropathy). Over the counter cream for neuropathy (Patient not taking: Reported on 11/06/2020)     Polyvinyl Alcohol-Povidone (REFRESH OP) Place 1 drop into both eyes daily as needed (dry eyes/itching). (Patient not taking: No sig reported)     No current facility-administered medications for this visit.    PHYSICAL EXAMINATION: ECOG PERFORMANCE STATUS: 0 - Asymptomatic  Vitals:   12/27/20 1338  BP: 132/68  Pulse: 67  Resp: 18  Temp: 98 F (36.7 C)  SpO2: 98%   Wt Readings from Last 3 Encounters:  12/27/20 141 lb 11.2 oz (64.3 kg)  11/06/20 144 lb 3.2 oz (65.4 kg)  03/06/20 147 lb (66.7 kg)     GENERAL:alert, no distress and comfortable SKIN: skin color, texture, turgor are normal, no significant lesions; (+) rash to chest EYES: normal, Conjunctiva are pink and non-injected, sclera clear  NECK: supple, thyroid normal size, non-tender, without nodularity LYMPH:  no palpable lymphadenopathy in the cervical, axillary  NEURO: alert & oriented x 3 with fluent speech, no focal motor/sensory deficits BREAST: No palpable mass, nodules or adenopathy bilaterally. Breast exam benign.   LABORATORY DATA:  I have reviewed the data as listed CBC Latest Ref Rng & Units 12/28/2019 12/29/2018 08/04/2018  WBC 4.0 - 10.5 K/uL 5.7 6.8 4.9  Hemoglobin 12.0 - 15.0 g/dL 15.5(H) 16.1(H) 15.0  Hematocrit 36.0 - 46.0 % 46.5(H) 49.4(H) 44.4  Platelets 150 - 400 K/uL 211 196 287.0     CMP Latest Ref Rng & Units 12/27/2020 12/28/2019 12/29/2018  Glucose 70 - 99 mg/dL 132(H) 93 79  BUN 8 - 23 mg/dL '12 11 18  ' Creatinine 0.44 - 1.00 mg/dL 1.08(H) 0.93 0.97  Sodium 135 - 145 mmol/L 142 142 141  Potassium 3.5 - 5.1 mmol/L 3.2(L) 3.5 3.6  Chloride 98 - 111 mmol/L 108 108 107  CO2 22 - 32 mmol/L '24 26 24  ' Calcium 8.9 - 10.3 mg/dL 9.7 9.9 9.4  Total Protein 6.5 - 8.1 g/dL 6.8 7.2 7.2  Total Bilirubin 0.3 - 1.2 mg/dL 0.8 0.5 0.2(L)  Alkaline Phos 38 - 126  U/L 81 96 127(H)  AST 15 - 41 U/L 11(L) 14(L) 13(L)  ALT 0 - 44 U/L '11 16 16      ' RADIOGRAPHIC STUDIES: I have personally reviewed the radiological images as listed and agreed with the findings in the report. No results found.   ASSESSMENT & PLAN:  [name] is a 80 y.o. female with   1. History of Left Breast DCIS -S/p left mastectomy and 13 node axillary dissection and bilateral reconstruction -On breast cancer surveillance with annual mammogram. She declines annual MRI. -mammogram in 02/2020 was negative.  -I feel comfortable releasing her to f/u with her PCP. She will discuss this with them.   2. History of Ovarian Carcinoma, recurrent, NED now -She initially diagnosed with stage IIIC in 1998 and pelvic recurrence in 2008, s/p surgery and chemo - Last surveillance PET scan in 02/2012  showed no evidence of disease. -She has not had a recent pelvic exam, she is switching to a new PCP and does not have OB anymore.  -Ca 125 is pending from today. I will contact her if it is abnormal.   3. BRCA 1+ -Pt is BRCA 1 mutation positive -Due to her advanced age, right prophylactic mastectomy was not recommended. -She is compliant with annual mammogram, last breast MRI done in 2017. She declines repeat MRI.   4. 20+ years on supplemental estrogen, estradiol -I again reviewed the risk for breast and GYN cancers on supplemental estrogen.  She is down to half tab nightly.  She continues because she does not tolerate hot flashes.  She is not willing to stop.   5. Dementia -She has developed mild memory issue, mostly with short-term memory. Currently on donepezil, she is functioning well at home but memory loss is progressing -Continue follow-up with neurology   6. 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  -she has a rash  on her chest that she reports comes and goes. I recommended she try hydrocortisone cream. -Continue follow-up with Derm   PLAN: -I ordered mammogram to be done in 03/2021 -I will release her to her PCP. F/u PRN   No problem-specific Assessment & Plan notes found for this encounter.   No orders of the defined types were placed in this encounter.  All questions were answered. The patient knows to call the clinic with any problems, questions or concerns. No barriers to learning was detected. The total time spent in the appointment was 20 minutes.     Truitt Merle, MD 12/27/2020   I, Wilburn Mylar, am acting as scribe for Truitt Merle, MD.   I have reviewed the above documentation for accuracy and completeness, and I agree with the above.

## 2020-12-28 LAB — CA 125: Cancer Antigen (CA) 125: 9.6 U/mL (ref 0.0–38.1)

## 2020-12-29 ENCOUNTER — Encounter: Payer: Self-pay | Admitting: Hematology

## 2021-01-17 ENCOUNTER — Encounter: Payer: Self-pay | Admitting: Family Medicine

## 2021-01-17 ENCOUNTER — Ambulatory Visit (INDEPENDENT_AMBULATORY_CARE_PROVIDER_SITE_OTHER): Payer: Medicare HMO | Admitting: Family Medicine

## 2021-01-17 ENCOUNTER — Other Ambulatory Visit: Payer: Self-pay

## 2021-01-17 VITALS — BP 102/62 | HR 67 | Temp 97.6°F | Ht 68.0 in | Wt 140.8 lb

## 2021-01-17 DIAGNOSIS — R7309 Other abnormal glucose: Secondary | ICD-10-CM

## 2021-01-17 DIAGNOSIS — E876 Hypokalemia: Secondary | ICD-10-CM | POA: Diagnosis not present

## 2021-01-17 DIAGNOSIS — S0181XA Laceration without foreign body of other part of head, initial encounter: Secondary | ICD-10-CM

## 2021-01-17 DIAGNOSIS — Z86000 Personal history of in-situ neoplasm of breast: Secondary | ICD-10-CM | POA: Diagnosis not present

## 2021-01-17 DIAGNOSIS — Z0000 Encounter for general adult medical examination without abnormal findings: Secondary | ICD-10-CM

## 2021-01-17 DIAGNOSIS — E785 Hyperlipidemia, unspecified: Secondary | ICD-10-CM | POA: Diagnosis not present

## 2021-01-17 DIAGNOSIS — E2839 Other primary ovarian failure: Secondary | ICD-10-CM | POA: Diagnosis not present

## 2021-01-17 DIAGNOSIS — R413 Other amnesia: Secondary | ICD-10-CM

## 2021-01-17 DIAGNOSIS — E039 Hypothyroidism, unspecified: Secondary | ICD-10-CM

## 2021-01-17 HISTORY — DX: Other abnormal glucose: R73.09

## 2021-01-17 HISTORY — DX: Laceration without foreign body of other part of head, initial encounter: S01.81XA

## 2021-01-17 HISTORY — DX: Hyperlipidemia, unspecified: E78.5

## 2021-01-17 HISTORY — DX: Hypokalemia: E87.6

## 2021-01-17 HISTORY — DX: Other primary ovarian failure: E28.39

## 2021-01-17 MED ORDER — ESTRADIOL 1 MG PO TABS: 1.0000 mg | ORAL_TABLET | Freq: Every day | ORAL | 1 refills | Status: DC

## 2021-01-17 NOTE — Progress Notes (Addendum)
Established Patient Office Visit  Subjective:  Patient ID: [name], female    DOB: 16-Sep-1940  Age: 80 y.o. MRN: [id number]  CC:  Chief Complaint  Patient presents with   Transitions Of Care    TOC from Dr. Loletha Grayer to Dr. Ethelene Hal. Concerns with cognitive issues patient very forgetful.     HPI [name] presents for establishment of care by way of transfer.  History of mild neurocognitive disorder, hypothyroidism, dyslipidemia, elevated glucose, estrogen deficiency with a history of DCIS and ovarian cancer.  She is accompanied by her significant other.  CMP drawn earlier this month showed an elevated glucose.  She has no history of diabetes.  Potassium was a little low.  Last lipid panel showed an LDL of 162.  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  Seeing neurology for cognitive decline.  Neurocognitive evaluation is planned for September.  She is taking oral estrogen for menopausal symptoms with past medical history of DCIS and ovarian cancer.  She burned herself with a curling iron yesterday.  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   Peripheral neuropathy 09/06/2012   Syncopal episodes 08/04/2018   Vitamin D deficiency     Past Surgical History:  Procedure Laterality Date   BIOPSY THYROID     for benign disease   BREAST SURGERY     CHOLECYSTECTOMY     COLON SURGERY     colectomy for cancer   colonscopy  2004   2/08   left ankle fracture     status post surgery, March 2010   MASTECTOMY  1992   left mastectomy   PORT-A-CATH REMOVAL Right 04/15/2015   Procedure: REMOVAL PORT-A-CATH;  Surgeon: [name], MD;  Location: [facility];  Service: General;  Laterality: Right;  Right upper chest   TOTAL ABDOMINAL HYSTERECTOMY W/ BILATERAL SALPINGOOPHORECTOMY  1998   TAH for ovarian cancer    Family History  Problem Relation Age of Onset   Cancer Mother        ovarian; history of melanoma    Aneurysm Father        abdominal aortic   Heart disease Father    Diverticulitis Brother    Breast cancer Maternal Aunt     Social History   Socioeconomic History   Marital status: Single    Spouse name: Not on file   Number of children: 2   Years of education: 47   Highest education level: Master's degree (e.g., MA, MS, MEng, MEd, MSW, MBA)  Occupational History   Occupation: Retired  Tobacco Use   Smoking status: Former    Types: Cigarettes    Quit date: 05/26/1991    Years since quitting: 29.6   Smokeless tobacco: Never  Vaping Use   Vaping Use: Never used  Substance and Sexual Activity   Alcohol use: Yes    Comment: social   Drug use: No   Sexual activity: Not on file  Other Topics Concern   Not on file  Social History Narrative   March 1998- history of stage III ovarian cancer; status post suboptimal debulking; status post 6 cycles of Taxoil and carboplatin with complete remission  documented by second look surgery September 1998.      Recurrent ovarian cancer, status post 6 cycles carbo and Taxol, April 1998 until July 2008      1992, left breast intraductal carcinoma (DCIS)   Status post left modified radial mastectomy and with silicon implant   13 of 13 lymph modes negative; positive for BCRA1 gene   Recurrent ovarian carinoma, February 2008   Admit Feb. 2009 partial small bowel obstruction treated medically      Right handed    Social Determinants of Health   Financial Resource Strain: Not on file  Food Insecurity: Not on file  Transportation Needs: Not on file  Physical Activity: Not on file  Stress: Not on file  Social Connections: Not on file  Intimate Partner Violence: Not on file     Outpatient Medications Prior to Visit  Medication Sig Dispense Refill   Aspirin-Acetaminophen-Caffeine (EXCEDRIN MIGRAINE PO) Take 1 tablet by mouth daily as needed (headache).     donepezil (ARICEPT) 10 MG tablet Take 1 tablet (10 mg total) by mouth at bedtime. 90 tablet 3   Multiple Vitamin (MULTIVITAMIN WITH MINERALS) TABS tablet Take 1 tablet by mouth at bedtime.     estradiol (ESTRACE) 1 MG tablet TAKE 1 TABLET EVERY DAY 90 tablet 3   levothyroxine (SYNTHROID) 88 MCG tablet Take 1 tablet (88 mcg total) by mouth at bedtime. 90 tablet 3   OVER THE COUNTER MEDICATION Apply 1 application topically at bedtime as needed (foot burning/neuropathy). Over the counter cream for neuropathy (Patient not taking: Reported on 11/06/2020)     Polyvinyl Alcohol-Povidone (REFRESH OP) Place 1 drop into both eyes daily as needed (dry eyes/itching). (Patient not taking: No sig reported)     No facility-administered medications prior to visit.    Allergies  Allergen Reactions   Codeine Sulfate Nausea And Vomiting    Projectile vomiting   Promethazine Hcl Other (See Comments)    Unknown reaction    ROS Review of Systems  Constitutional: Negative.   HENT: Negative.    Eyes:  Negative for photophobia and visual disturbance.  Respiratory: Negative.    Cardiovascular: Negative.   Gastrointestinal: Negative.   Genitourinary: Negative.   Skin:  Positive for wound.  Neurological:  Negative for speech difficulty and weakness.  Psychiatric/Behavioral:  Positive for decreased concentration. Negative for behavioral problems and confusion.      Objective:    Physical Exam Vitals and nursing note reviewed.  Constitutional:      General: She is not in acute distress.    Appearance: Normal appearance. She is normal weight. She is not ill-appearing, toxic-appearing or diaphoretic.  HENT:     Head: Normocephalic and atraumatic.     Right Ear: Tympanic membrane, ear canal and external ear normal.      Left Ear: Tympanic membrane, ear canal and external ear normal.     Mouth/Throat:     Mouth: Mucous membranes are moist.     Pharynx: Oropharynx is clear. No oropharyngeal exudate or posterior oropharyngeal erythema.  Eyes:     General: No scleral icterus.       Right eye: No discharge.        Left eye: No discharge.     Conjunctiva/sclera: Conjunctivae normal.     Pupils: Pupils are equal, round, and reactive to light.  Neck:     Vascular: No carotid bruit.  Cardiovascular:     Rate and Rhythm: Normal rate and regular rhythm.  Pulmonary:  Effort: Pulmonary effort is normal.     Breath sounds: Normal breath sounds.  Abdominal:     General: Bowel sounds are normal.  Musculoskeletal:     Cervical back: No rigidity or tenderness.  Lymphadenopathy:     Cervical: No cervical adenopathy.  Skin:    General: Skin is warm and dry.       Neurological:     Mental Status: She is alert and oriented to person, place, and time.  Psychiatric:        Mood and Affect: Mood normal.        Behavior: Behavior normal.    BP 102/62 (BP Location: Right Arm, Patient Position: Sitting, Cuff Size: Normal)   Pulse 67   Temp 97.6 F (36.4 C) (Temporal)   Ht '5\' 8"'  (1.727 m)   Wt 140 lb 12.8 oz (63.9 kg)   SpO2 97%   BMI 21.41 kg/m  Wt Readings from Last 3 Encounters:  01/17/21 140 lb 12.8 oz (63.9 kg)  12/27/20 141 lb 11.2 oz (64.3 kg)  11/06/20 144 lb 3.2 oz (65.4 kg)     Health Maintenance Due  Topic Date Due   DEXA SCAN  Never done   PNA vac Low Risk Adult (2 of 2 - PPSV23) 09/23/2018   Zoster Vaccines- Shingrix (2 of 2) 12/12/2020   INFLUENZA VACCINE  12/23/2020    There are no preventive care reminders to display for this patient.  Lab Results  Component Value Date   TSH 1.18 01/17/2021   Lab Results  Component Value Date   WBC 5.8 12/27/2020   HGB 15.8 (H) 12/27/2020   HCT 47.1 (H) 12/27/2020   MCV 93.6 12/27/2020   PLT  12/27/2020    PLATELET CLUMPS NOTED ON SMEAR,  COUNT APPEARS ADEQUATE   Lab Results  Component Value Date   NA 140 01/17/2021   K 3.8 01/17/2021   CHLORIDE 104 12/17/2016   CO2 25 01/17/2021   GLUCOSE 94 01/17/2021   BUN 16 01/17/2021   CREATININE 1.01 (H) 01/17/2021   BILITOT 0.8 12/27/2020   ALKPHOS 81 12/27/2020   AST 11 (L) 12/27/2020   ALT 11 12/27/2020   PROT 6.8 12/27/2020   ALBUMIN 3.7 12/27/2020   CALCIUM 9.6 01/17/2021   ANIONGAP 10 12/27/2020   EGFR 50 (L) 12/17/2016   GFR 58.16 (L) 08/04/2018   Lab Results  Component Value Date   CHOL 219 (H) 01/17/2021   Lab Results  Component Value Date   HDL 52 01/17/2021   Lab Results  Component Value Date   LDLCALC 140 (H) 01/17/2021   Lab Results  Component Value Date   TRIG 138 01/17/2021   Lab Results  Component Value Date   CHOLHDL 4.2 01/17/2021   Lab Results  Component Value Date   HGBA1C 5.0 01/17/2021      Assessment & Plan:   Problem List Items Addressed This Visit       Endocrine   Hypothyroidism   Relevant Medications   levothyroxine (SYNTHROID) 88 MCG tablet   Other Relevant Orders   TSH (Completed)     Other   Healthcare maintenance   Relevant Orders   Urinalysis, Routine w reflex microscopic (Completed)   History of ductal carcinoma in situ (DCIS) of breast   Memory impairment - Primary   Facial laceration   Elevated glucose   Relevant Orders   Hemoglobin A1c (Completed)   Estrogen deficiency   Relevant Medications   estradiol (ESTRACE) 1 MG tablet  Other Relevant Orders   Ambulatory referral to Gynecology   Dyslipidemia   Relevant Orders   Lipid panel (Completed)   Hypokalemia   Relevant Orders   Basic metabolic panel (Completed)    Meds ordered this encounter  Medications   estradiol (ESTRACE) 1 MG tablet    Sig: Take 1 tablet (1 mg total) by mouth daily.    Dispense:  30 tablet    Refill:  1   levothyroxine (SYNTHROID) 88 MCG tablet    Sig: Take 1 tablet (88 mcg total) by mouth at bedtime.    Dispense:   90 tablet    Refill:  3    Follow-up: Return in about 6 months (around 07/20/2021).   Continue follow-up with neurology.  May consider statin therapy with history of cognitive decline and microvascular disease.  Checking hemoglobin A1c.  Encouraged her to use a pillbox and stressed the importance of taking levothyroxine on an empty stomach.  Encouraged exercise.  Expressed my reluctance to continue oral estrogen at this point in her life.  Also with her health history.  Refer to GYN for follow-up of estrogen deficiency.  Not sure that vaginal estrogen is a good choice for her either. [name], MD

## 2021-01-18 LAB — URINALYSIS, ROUTINE W REFLEX MICROSCOPIC
Bilirubin Urine: NEGATIVE
Glucose, UA: NEGATIVE
Hgb urine dipstick: NEGATIVE
Ketones, ur: NEGATIVE
Leukocytes,Ua: NEGATIVE
Nitrite: NEGATIVE
Protein, ur: NEGATIVE
Specific Gravity, Urine: 1.016 (ref 1.001–1.035)
pH: 5.5 (ref 5.0–8.0)

## 2021-01-18 LAB — BASIC METABOLIC PANEL
BUN/Creatinine Ratio: 16 (calc) (ref 6–22)
BUN: 16 mg/dL (ref 7–25)
CO2: 25 mmol/L (ref 20–32)
Calcium: 9.6 mg/dL (ref 8.6–10.4)
Chloride: 105 mmol/L (ref 98–110)
Creat: 1.01 mg/dL — ABNORMAL HIGH (ref 0.60–0.95)
Glucose, Bld: 94 mg/dL (ref 65–99)
Potassium: 3.8 mmol/L (ref 3.5–5.3)
Sodium: 140 mmol/L (ref 135–146)

## 2021-01-18 LAB — LIPID PANEL
Cholesterol: 219 mg/dL — ABNORMAL HIGH (ref <200)
HDL: 52 mg/dL (ref >=50)
LDL Cholesterol (Calc): 140 mg/dL (calc) — ABNORMAL HIGH
Non-HDL Cholesterol (Calc): 167 mg/dL (calc) — ABNORMAL HIGH (ref <130)
Total CHOL/HDL Ratio: 4.2 (calc) (ref <5.0)
Triglycerides: 138 mg/dL (ref <150)

## 2021-01-18 LAB — HEMOGLOBIN A1C
Hgb A1c MFr Bld: 5 % of total Hgb (ref <5.7)
Mean Plasma Glucose: 97 mg/dL
eAG (mmol/L): 5.4 mmol/L

## 2021-01-18 LAB — TSH: TSH: 1.18 mIU/L (ref 0.40–4.50)

## 2021-01-20 MED ORDER — LEVOTHYROXINE SODIUM 88 MCG PO TABS: 88.0000 ug | ORAL_TABLET | Freq: Every day | ORAL | 3 refills | Status: DC

## 2021-01-20 NOTE — Addendum Note (Signed)
Addended by: Jon Billings on: 01/20/2021 11:16 AM   Modules accepted: Orders

## 2021-01-29 ENCOUNTER — Encounter: Payer: Medicare HMO | Admitting: Psychology

## 2021-02-05 ENCOUNTER — Encounter: Payer: Medicare HMO | Admitting: Psychology

## 2021-03-06 ENCOUNTER — Telehealth: Payer: Self-pay | Admitting: *Deleted

## 2021-03-06 NOTE — Chronic Care Management (AMB) (Signed)
  Chronic Care Management   Note  03/06/2021 Name: JANNINE ABREU MRN: [id number] DOB: 01/20/1941  AMAHIA MADONIA is a 80 y.o. year old female who is a primary care patient of Gena Fray, Lillette Boxer, MD. I reached out to [name] by phone today in response to a referral sent by Ms. Vickii Chafe Baham's PCP.  Ms. Eber was given information about Chronic Care Management services today including:  CCM service includes personalized support from designated clinical staff supervised by her physician, including individualized plan of care and coordination with other care providers 24/7 contact phone numbers for assistance for urgent and routine care needs. Service will only be billed when office clinical staff spend 20 minutes or more in a month to coordinate care. Only one practitioner may furnish and bill the service in a calendar month. The patient may stop CCM services at any time (effective at the end of the month) by phone call to the office staff. The patient is responsible for co-pay (up to 20% after annual deductible is met) if co-pay is required by the individual health plan.   Patient agreed to services and verbal consent obtained.   Follow up plan: Telephone appointment with care management team member scheduled for: 03/12/2021  Julian Hy, [place], [facility]  Direct Dial: [phone]

## 2021-03-12 ENCOUNTER — Ambulatory Visit: Payer: Medicare HMO

## 2021-03-12 DIAGNOSIS — R413 Other amnesia: Secondary | ICD-10-CM

## 2021-03-12 DIAGNOSIS — E039 Hypothyroidism, unspecified: Secondary | ICD-10-CM

## 2021-03-13 NOTE — Chronic Care Management (AMB) (Signed)
Chronic Care Management   CCM RN Visit Note  03/13/2021 Name: [name] MRN: [id number] DOB: [birth year]  Subjective: [name] is a 80 y.o. year old female who is a primary care patient of Gena Fray, Lillette Boxer, MD. The care management team was consulted for assistance with disease management and care coordination needs.    Engaged with patient by telephone for initial visit in response to provider referral for case management and/or care coordination services.   Consent to Services:  The patient was given the following information about Chronic Care Management services today, agreed to services, and gave verbal consent: 1. CCM service includes personalized support from designated clinical staff supervised by the primary care provider, including individualized plan of care and coordination with other care providers 2. 24/7 contact phone numbers for assistance for urgent and routine care needs. 3. Service will only be billed when office clinical staff spend 20 minutes or more in a month to coordinate care. 4. Only one practitioner may furnish and bill the service in a calendar month. 5.The patient may stop CCM services at any time (effective at the end of the month) by phone call to the office staff. 6. The patient will be responsible for cost sharing (co-pay) of up to 20% of the service fee (after annual deductible is met). Patient agreed to services and consent obtained.  Patient agreed to services and verbal consent obtained.   Assessment: Review of patient past medical history, allergies, medications, health status, including review of consultants reports, laboratory and other test data, was performed as part of comprehensive evaluation and provision of chronic care management services.   SDOH (Social Determinants of Health) assessments and interventions performed:    CCM Care Plan  Allergies  Allergen Reactions   Codeine Sulfate Nausea And Vomiting    Projectile vomiting    Promethazine Hcl Other (See Comments)    Unknown reaction    Outpatient Encounter Medications as of 03/12/2021  Medication Sig   Aspirin-Acetaminophen-Caffeine (EXCEDRIN MIGRAINE PO) Take 1 tablet by mouth daily as needed (headache).   donepezil (ARICEPT) 10 MG tablet Take 1 tablet (10 mg total) by mouth at bedtime.   levothyroxine (SYNTHROID) 88 MCG tablet Take 1 tablet (88 mcg total) by mouth at bedtime.   Multiple Vitamin (MULTIVITAMIN WITH MINERALS) TABS tablet Take 1 tablet by mouth at bedtime.   estradiol (ESTRACE) 1 MG tablet Take 1 tablet (1 mg total) by mouth daily. (Patient not taking: Reported on 03/12/2021)   No facility-administered encounter medications on file as of 03/12/2021.    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   Vitamin D deficiency    Peripheral neuropathy 09/06/2012   Hypothyroidism 05/11/2011   History of malignant neoplasm of breast 02/29/2008   History of malignant neoplasm of ovary 02/29/2008   Abdominal pain, generalized 06/23/2007    Conditions to be addressed/monitored: Memory Impairment and Hypothyroidism  Care Plan : RN - Plan of Care  Updates made by Dannielle Karvonen, RN since 03/13/2021 12:00 AM     Problem: Chronic disease management, education and care coordination needs ( Memory impairment, hypothyroidism)   Priority:  High     Long-Range Goal: Development of  plan of care to address chronic disease management and care coordination needs ( Memory Impairment, hypothyroidism)   Start Date: 03/12/2021  Expected End Date: 07/22/2021  Priority: High  Note:    Current Barriers:  Knowledge Deficits related to plan of care for management of Memory Impairment, hypothyroidism Chronic Disease Management support and education needs related to Memory Impairment, hypothyrodism Cognitive Deficits Telephone call to patient to discuss CCM embedded program. HIPAA verified. Patient verbally agreed to follow up for CCM program.  Patient also gave verbal authorization to speak with her significant other, Maryanna Shape regarding her personal health information. She states he helps her with keeping up with her appointments and attends her provider visits.   Patient states she started noticing memory issues in 2020.  She states she feels it has gotten worse. She reports seeing Dr. Ethelene Hal accompanied by her Mr. Doren Custard on 01/17/2021 regarding her memory concerns.  Telephone call to Maryanna Shape,  HIPAA verified by Mr. Hardin Negus for patient. Explained to Mr. Hardin Negus that patient will be followed by the [facility] for the CCM program.  Mr. Hardin Negus states patient is scheduled for a cognitive impairment test in February 2023.  RNCM Clinical Goal(s):  Patient will verbalize understanding of plan for management of Memory Impairment, hypothyroidism verbalize basic understanding of  Memory Impairment, hypothyroidism disease process and self health management plan    through collaboration with RN Care manager, provider, and care team.   Interventions: 1:1 collaboration with primary care provider regarding development and update of comprehensive plan of care as evidenced by provider attestation and co-signature Inter-disciplinary care team collaboration (see longitudinal plan of care) Evaluation of current treatment plan related to  self management and patient's adherence to plan as established by provider  Memory Impairment. New goal. Evaluation of current treatment plan related to  Cognitive decline , self-management and patient's adherence to plan as established by  provider. Discussed plans with patient/ caregiver for ongoing care management follow up and provided patient with direct contact information for care management team Reviewed medications with patient and discussed  ; Reviewed scheduled/upcoming provider appointments including; Discussed plans with patient for ongoing care management follow up and provided patient with direct contact information for care management team; Provided education to patient and caregiver regarding strategies to manage (crossword puzzels, active social life, managing health conditions, healthy diet, quality sleep) Sent education article to patient on Mild cognitive impairment  Patient Goals/Self-Care Activities: Patient will self administer medications as prescribed Patient will attend all scheduled provider appointments Patient will call provider office for new concerns or questions Patient will incorporate strategies to manage memory loss (crossword puzzels, active social life, managing health conditions, healthy diet, good quality sleep) Review education article sent to you in MyChart on mild cognitive impairment  HypothyroidismNew goal. Evaluation of current treatment plan related to  Hypothyroidism , self-management and patient's adherence to plan as established by provider. Discussed plans with patient for ongoing care management follow up and provided patient with direct contact information for care management team Reviewed medications with patient and discussed  ; Reviewed scheduled/upcoming provider appointments including;  Discussed hypothyroidism signs/ symptoms and when to call doctor  Patient Goals/ Self-Care Activities: Take your medication as prescribed Follow up with your provider as recommended Notify your doctor for symptoms:    restlessness, rapid weight loss, and sweating Low temperature, low blood sugar, slow breathing, not responsive Feeling very tired Puffy hands, face, or feet Irregular  heart beat Confusion You are not feeling better in 2 to 3 days or you are feeling worse       Plan:The patient has been provided with contact information for the care management team and has been advised to call with any health related questions or concerns.  The care management team will reach out to the patient again over the next 2-3 months. Quinn Plowman RN,BSN,CCM RN Case Manager [place] [phone]

## 2021-03-13 NOTE — Patient Instructions (Addendum)
Visit Information:  Thank you for taking the time to speak with me today.    Consent to CCM Services: Ms. Greenhouse was given information about Chronic Care Management services including:  CCM service includes personalized support from designated clinical staff supervised by her physician, including individualized plan of care and coordination with other care providers 24/7 contact phone numbers for assistance for urgent and routine care needs. Service will only be billed when office clinical staff spend 20 minutes or more in a month to coordinate care. Only one practitioner may furnish and bill the service in a calendar month. The patient may stop CCM services at any time (effective at the end of the month) by phone call to the office staff. The patient will be responsible for cost sharing (co-pay) of up to 20% of the service fee (after annual deductible is met).  Patient agreed to services and verbal consent obtained.   Patient verbalizes understanding of instructions provided today and agrees to view in [place].   The patient has been provided with contact information for the care management team and has been advised to call with any health related questions or concerns.  The care management team will reach out to the patient again over the next 2-3 months.   Quinn Plowman RN,BSN,CCM RN Case Manager [place] [facility]  [phone]   CLINICAL CARE PLAN: Patient Care Plan: RN - Plan of Care     Problem Identified: Chronic disease management, education and care coordination needs ( Memory impairment, hypothyroidism)   Priority: High     Long-Range Goal: Development of  plan of care to address chronic disease management and care coordination needs ( Memory Impairment, hypothyroidism)   Start Date: 03/12/2021  Expected End Date: 07/22/2021  Priority: High  Note:   Current Barriers:  Knowledge Deficits related to plan of care for management of Memory Impairment,  hypothyroidism Chronic Disease Management support and education needs related to Memory Impairment, hypothyrodism Cognitive Deficits Telephone call to patient to discuss CCM embedded program. HIPAA verified. Patient verbally agreed to follow up for CCM program.  Patient also gave verbal authorization to speak with her significant other, Maryanna Shape regarding her personal health information. She states he helps her with keeping up with her appointments and attends her provider visits.   Patient states she started noticing memory issues in 2020.  She states she feels it has gotten worse. She reports seeing Dr. Ethelene Hal accompanied by her Mr. Doren Custard on 01/17/2021 regarding her memory concerns.  Telephone call to Maryanna Shape,  HIPAA verified by Mr. Hardin Negus for patient. Explained to Mr. Hardin Negus that patient will be followed by the [facility] for the CCM program.  Mr. Hardin Negus states patient is scheduled for a cognitive impairment test in February 2023.  RNCM Clinical Goal(s):  Patient will verbalize understanding of plan for management of Memory Impairment, hypothyroidism verbalize basic understanding of  Memory Impairment, hypothyroidism disease process and self health management plan    through collaboration with RN Care manager, provider, and care team.   Interventions: 1:1 collaboration with primary care provider regarding development and update of comprehensive plan of care as evidenced by provider attestation and co-signature Inter-disciplinary care team collaboration (see longitudinal plan of care) Evaluation of current treatment plan related to  self management and patient's adherence to plan as established by provider  Memory Impairment. New goal. Evaluation of current treatment plan related to  Cognitive decline , self-management and patient's adherence to plan as established by provider. Discussed plans with  patient/ caregiver for ongoing care management follow up and provided patient with  direct contact information for care management team Reviewed medications with patient and discussed  ; Reviewed scheduled/upcoming provider appointments including; Discussed plans with patient for ongoing care management follow up and provided patient with direct contact information for care management team; Provided education to patient and caregiver regarding strategies to manage (crossword puzzels, active social life, managing health conditions, healthy diet, quality sleep) Sent education article to patient on Mild cognitive impairment  Patient Goals/Self-Care Activities: Patient will self administer medications as prescribed Patient will attend all scheduled provider appointments Patient will call provider office for new concerns or questions Patient will incorporate strategies to manage memory loss (crossword puzzels, active social life, managing health conditions, healthy diet, good quality sleep) Review education article sent to you in MyChart on mild cognitive impairment  HypothyroidismNew goal. Evaluation of current treatment plan related to  Hypothyroidism , self-management and patient's adherence to plan as established by provider. Discussed plans with patient for ongoing care management follow up and provided patient with direct contact information for care management team Reviewed medications with patient and discussed  ; Reviewed scheduled/upcoming provider appointments including;  Discussed hypothyroidism signs/ symptoms and when to call doctor  Patient Goals/ Self-Care Activities: Take your medication as prescribed Follow up with your provider as recommended Notify your doctor for symptoms:    restlessness, rapid weight loss, and sweating Low temperature, low blood sugar, slow breathing, not responsive Feeling very tired Puffy hands, face, or feet Irregular heart beat Confusion You are not feeling better in 2 to 3 days or you are feeling worse

## 2021-03-24 DIAGNOSIS — R413 Other amnesia: Secondary | ICD-10-CM | POA: Diagnosis not present

## 2021-03-24 DIAGNOSIS — E039 Hypothyroidism, unspecified: Secondary | ICD-10-CM

## 2021-03-26 ENCOUNTER — Ambulatory Visit
Admission: RE | Admit: 2021-03-26 | Discharge: 2021-03-26 | Disposition: A | Discharge status: Home / Self Care | Payer: Medicare HMO | Source: Ambulatory Visit | Attending: Hematology | Admitting: Hematology

## 2021-03-26 DIAGNOSIS — Z1501 Genetic susceptibility to malignant neoplasm of breast: Secondary | ICD-10-CM

## 2021-03-26 DIAGNOSIS — Z1231 Encounter for screening mammogram for malignant neoplasm of breast: Secondary | ICD-10-CM | POA: Diagnosis not present

## 2021-03-26 DIAGNOSIS — Z1509 Genetic susceptibility to other malignant neoplasm: Secondary | ICD-10-CM

## 2021-03-27 DIAGNOSIS — N951 Menopausal and female climacteric states: Secondary | ICD-10-CM | POA: Diagnosis not present

## 2021-03-27 DIAGNOSIS — E2839 Other primary ovarian failure: Secondary | ICD-10-CM | POA: Diagnosis not present

## 2021-03-27 DIAGNOSIS — Z8543 Personal history of malignant neoplasm of ovary: Secondary | ICD-10-CM | POA: Diagnosis not present

## 2021-03-27 DIAGNOSIS — Z853 Personal history of malignant neoplasm of breast: Secondary | ICD-10-CM | POA: Diagnosis not present

## 2021-03-27 DIAGNOSIS — F0390 Unspecified dementia without behavioral disturbance: Secondary | ICD-10-CM | POA: Diagnosis not present

## 2021-04-02 ENCOUNTER — Ambulatory Visit: Payer: Medicare HMO

## 2021-04-08 ENCOUNTER — Telehealth: Payer: Self-pay | Admitting: *Deleted

## 2021-04-08 NOTE — Chronic Care Management (AMB) (Signed)
  Care Management   Note  04/08/2021 Name: [name] MRN: [id number] DOB: Dec 22, 1940  [name] is a 80 y.o. year old female who is a primary care patient of [name], MD and is actively engaged with the care management team. I reached out to [name] by phone today to assist with re-scheduling a follow up visit with the RN Case Manager  Follow up plan: Unsuccessful telephone outreach attempt made. A HIPAA compliant phone message was left for the patient providing contact information and requesting a return call.   Julian Hy, [place] Management  Direct Dial: [phone]

## 2021-04-09 ENCOUNTER — Ambulatory Visit: Payer: Medicare HMO

## 2021-04-16 ENCOUNTER — Ambulatory Visit: Payer: Medicare HMO

## 2021-04-23 NOTE — Chronic Care Management (AMB) (Signed)
  Care Management   Note  04/23/2021 Name: [name] MRN: [id number] DOB: 02-Nov-1940  [name] is a 79 y.o. year old female who is a primary care patient of [name], MD and is actively engaged with the care management team. I reached out to [name] by phone today to assist with re-scheduling a follow up visit with the RN Case Manager  Follow up plan: Telephone appointment with care management team member scheduled for: 07/02/2021  Julian Hy, [place], [place] Management  Direct Dial: [phone]

## 2021-04-29 ENCOUNTER — Ambulatory Visit: Payer: Medicare HMO

## 2021-05-02 ENCOUNTER — Encounter: Payer: Self-pay | Admitting: Psychology

## 2021-05-02 ENCOUNTER — Ambulatory Visit: Payer: Medicare HMO | Admitting: Psychology

## 2021-05-02 ENCOUNTER — Ambulatory Visit (INDEPENDENT_AMBULATORY_CARE_PROVIDER_SITE_OTHER): Payer: Medicare HMO | Admitting: Psychology

## 2021-05-02 ENCOUNTER — Other Ambulatory Visit: Payer: Self-pay

## 2021-05-02 DIAGNOSIS — R4189 Other symptoms and signs involving cognitive functions and awareness: Secondary | ICD-10-CM

## 2021-05-02 DIAGNOSIS — G3184 Mild cognitive impairment, so stated: Secondary | ICD-10-CM

## 2021-05-02 NOTE — Progress Notes (Signed)
   Psychometrician Note   Cognitive testing was administered to [name] by [name], B.S. (psychometrist) under the supervision of [name], Ph.D., licensed psychologist on 05/02/21. [name] did not appear overtly distressed by the testing session per behavioral observation or responses across self-report questionnaires. Rest breaks were offered.    The battery of tests administered was selected by [name], Ph.D. with consideration to [name]'s current level of functioning, the nature of her symptoms, emotional and behavioral responses during interview, level of literacy, observed level of motivation/effort, and the nature of the referral question. This battery was communicated to the psychometrist. Communication between [name], Ph.D. and the psychometrist was ongoing throughout the evaluation and [name], Ph.D. was immediately accessible at all times. [name], Ph.D. provided supervision to the psychometrist on the date of this service to the extent necessary to assure the quality of all services provided.    [name] will return within approximately 1-2 weeks for an interactive feedback session with [name] at which time her test performances, clinical impressions, and treatment recommendations will be reviewed in detail. [name] understands she can contact our office should she require our assistance before this time.  A total of 130 minutes of billable time were spent face-to-face with [name] by the psychometrist. This includes both test administration and scoring time. Billing for these services is reflected in the clinical report generated by [name], Ph.D.  This note reflects time spent with the psychometrician and does not include test scores or any clinical interpretations made by [name]. The full report will follow in a separate note.

## 2021-05-02 NOTE — Progress Notes (Signed)
NEUROPSYCHOLOGICAL EVALUATION [place]. [place] Department of Neurology  Date of Evaluation: May 02, 2021  Reason for Referral:   [name] is a 80 y.o. right-handed Caucasian female referred by [name], M.D., to characterize her current cognitive functioning and assist with diagnostic clarity and treatment planning in the context of prior diagnosis of an amnestic mild cognitive impairment and concern surrounding continued cognitive decline.   Assessment and Plan:   Clinical Impression(s): [name] pattern of performance is suggestive of profound impairment surrounding delayed retrieval and recognition/consolidation aspects of memory. Performance variability was exhibited across processing speed and executive functioning. Regarding the latter, impairments were seen across cognitive flexibility and response inhibition. A further weakness was noted encoding (i.e., learning) newly presented information. Relative to age-matched peers, performances were appropriate across attention/concentration, safety/judgment, receptive and expressive language, and visuospatial abilities. [name] has continued to live alone and denied difficulties completing instrumental activities of daily living (ADLs) independently. While her significant other [name] had previously expressed concerns surrounding medication management, he generally did not contradict her statement. 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  Relative to her previous evaluation, cognitive decline was consistently observed across encoding aspects of memory, cognitive flexibility, and semantic fluency. Consistent decline was also observed across attention/concentration; however, these scores remained appropriate normatively. Variable decline was noted across  processing speed and response inhibition. Variable decline was also noted across visuospatial abilities; however, some of this could be due to ongoing visual acuity concerns. Performances were generally stable across safety/judgment, phonemic fluency, receptive language, and confrontation naming.   Regarding etiology, notable concerns remain surrounding underlying Alzheimer's disease. Across memory testing, [name] was amnestic across all memory tasks and performed poorly across yes/no recognition trials. This suggests not only the presence of rapid forgetting, but also severe memory storage dysfunction, both of which are hallmark characteristics of this disease process. Decline in semantic fluency and certain aspects of executive functioning are further consistent with this presentation. At present, this disease appears to be progressing slowly, likely aided by longstanding medication intervention. Despite [name]'s claims, this degree of impairment is certainly not caused by extended isolation stemming from the COVID-19 pandemic. As further progression of cognitive deficits are expected as time goes on, continued medical monitoring will be important moving forward.   While I only have head CTs from 2014 and 2020 to review, neither revealed significant vascular contributions, making a vascular etiology unlikely. She does not display common behavioral or testing features of Lewy body dementia, frontotemporal dementia, or other more rare parkinsonian conditions at the present time.  Recommendations: A repeat neuropsychological evaluation in 18-24 months (or sooner if functional decline is noted) is recommended to assess the trajectory of future cognitive decline should it occur. This will also aid in future efforts towards improved diagnostic clarity.  [name] has been taking medication given to individuals with memory concerns and possible Alzheimer's disease (i.e., donepezil/Aricept)  since 2018. She is encouraged to continue taking this medication as prescribed. It is important to highlight that no current treatment is able to stop or reverse cognitive decline caused by a neurodegenerative illness.   During testing, she reported ongoing visual acuity concerns, stating that "one eye is going up while the other is looking down." This is despite denying any vision changes during interview and disagreeing with [name] when he expressed concern surrounding visual  acuity. I agree with [name] in that it would be prudent for [name] to be referred for an updated eye exam.   She could also discuss the pros and cons of updated neuroimaging (namely a brain MRI) with [name] to better understand possible anatomical correlates to ongoing dysfunction.   Should there be further progression of current deficits over time, she is unlikely to regain any independent living skills lost. Therefore, it is recommended that she remain as involved as possible in all aspects of household chores, finances, and medication management, with supervision to ensure adequate performance. She will likely benefit from the establishment and maintenance of a routine in order to maximize her functional abilities over time.  It will be important for [name] to have another person with her when in situations where she may need to process information, weigh the pros and cons of different options, and make decisions, in order to ensure that she fully understands and recalls all information to be considered.  If not already done, [name] and those close to her may want to discuss her wishes regarding durable power of attorney and medical decision making, so that she can have input into these choices. Additionally, they may wish to discuss future plans for caretaking and seek out community options for in home/residential care should they become necessary.  Performance across neurocognitive testing is not a  strong predictor of an individual's safety operating a motor vehicle. Should her or those close to her wish to pursue a formalized driving evaluation, they could reach out to the following agencies: [employer] in [place]: [phone] Driver Rehabilitative Services: [facility]: [place]: [phone] or [phone]  Ms. Fulop is encouraged to attend to lifestyle factors for brain health (e.g., regular physical exercise, good nutrition habits, regular participation in cognitively-stimulating activities, and general stress management techniques), which are likely to have benefits for both emotional adjustment and cognition. Optimal control of vascular risk factors (including safe cardiovascular exercise and adherence to dietary recommendations) is encouraged. Continued participation in activities which provide mental stimulation and social interaction is also recommended.   Important information should be provided to Ms. Dobos in written format in all instances. This information should be placed in a highly frequented and easily visible location within her home to promote recall. External strategies such as written notes in a consistently used memory journal, visual and nonverbal auditory cues such as a calendar on the refrigerator or appointments with alarm, such as on a cell phone, can also help maximize recall.    To address problems with processing speed, she may wish to consider:   -Ensuring that she is alerted when essential material or instructions are being presented   -Adjusting the speed at which new information is presented   -Allowing for more time in comprehending, processing, and responding in conversation  To address problems with executive dysfunction, she may wish to consider:   -Avoiding external distractions when needing to concentrate   -Limiting exposure to fast paced environments with multiple sensory  demands   -Writing down complicated information and using checklists   -Attempting and completing one task at a time (i.e., no multi-tasking)   -Verbalizing aloud each step of a task to maintain focus   -Reducing the amount of information considered at one time  Review of Records:   Ms. Nilan was seen by [facility] [place] KitchenEllouise Newer, M.D.) on 08/11/2019 for follow-up of memory loss. Briefly, Ms. Yohn had been taking Donepezil  64m since 2018 previously prescribed by Dr. KBurnice Logan She had a syncopal episode in March. While she did not initially remember the event, she eventually recalled being in the check out aisle, did not feel good at the time, and woke up on the ground. Tongue bite or incontinence was denied. She attributed this to her not eating breakfast prior to going to the store. Bloodwork, EKG, and head CT were unremarkable and no additional spells were reported. Regarding memory, she described these abilities as "not good." Despite this, she reported doing fine living alone. Her significant other FPilar Platelives a mile away and sees her everyday. He has known her for over 30 years and started noticing short-term memory issues worsening over the past year. He reported that she has not gotten lost while driving and has managed to stay on top of her bills and medications. She denied headaches, dizziness, vision changes, focal numbness/tingling/weakness, neck/back pain, bowel/bladder dysfunction, anosmia, tremors, wandering behavior, paranoia, or hallucinations. FPilar Platedenied any staring/unresponsive episodes. She further denied any olfactory/gustatory hallucinations, rising epigastric sensation, or myoclonic jerks. Performance on a cognitive screening instrument (MOCA Blind) in August 2020 was 12/22.  She completed a comprehensive neuropsychological evaluation with myself on 10/19/2019. Results suggested significant impairment surrounding retrieval/consolidation aspects of both verbal and  visual memory. Additional variability was exhibited across processing speed, while all other cognitive domains were generally appropriate. Ultimately, she was diagnosed with an amnestic mild neurocognitive disorder. Concerns were expressed for underlying Alzheimer's disease and repeat testing in 12-18 months was recommended.   [facility]2020 for follow-up. At that time, Ms. Yearby acknowledged that her short-term memory may be worse, especially when trying to remember names of people or telephone numbers. FPilar Platereported that when she gets to the grocery store, she forgets what she came for or is unable to find needed groceries. He also noted that she has been asking the same questions or repeating the same stories much more frequently. She noted that the ongoing COVID-19 pandemic has negatively influenced her mood. ADLs were generally described as intact. However, FPilar Plateexpressed suspicion that she is having difficulty with medication management. Performance on a brief cognitive screening instrument (MOCA) was 19/30. Ultimately, Ms. Strick was referred for a repeat neuropsychological evaluation to characterize her cognitive abilities and to assist with diagnostic clarity and treatment planning.    Head CT on 06/27/2012 was negative. Head CT on 07/25/2018 in the context of a fall revealed a large right parieto-occipital scalp hematoma without underlying skull fracture and chronic microvascular ischemic disease. No acute abnormalities were reported. No other neuroimaging was available for review.   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 1993; Adenocarcinoma   History of malignant neoplasm of ovary    1998 and 2004   Hypokalemia 01/17/2021   Hypothyroidism 05/11/2011   Peripheral neuropathy 09/06/2012   Syncopal episodes 08/04/2018   Vasomotor instability 09/16/2014   Vitamin D deficiency     Past Surgical History:  Procedure Laterality Date   BIOPSY THYROID     for benign disease   BREAST SURGERY  CHOLECYSTECTOMY     COLON SURGERY     colectomy for cancer   colonscopy  2004   2/08   left ankle fracture     status post surgery, March 2010   MASTECTOMY  1992   left mastectomy   PORT-A-CATH REMOVAL Right 04/15/2015   Procedure: REMOVAL PORT-A-CATH;  Surgeon: [name], MD;  Location: [place];  Service: General;  Laterality: Right;  Right upper chest   TOTAL ABDOMINAL HYSTERECTOMY W/ BILATERAL SALPINGOOPHORECTOMY  1998   TAH for ovarian cancer    Current Outpatient Medications:    Aspirin-Acetaminophen-Caffeine (EXCEDRIN MIGRAINE PO), Take 1 tablet by mouth daily as needed (headache)., Disp: , Rfl:    donepezil (ARICEPT) 10 MG tablet, Take 1 tablet (10 mg total) by mouth at bedtime., Disp: 90 tablet, Rfl: 3   estradiol (ESTRACE) 1 MG tablet, Take 1 tablet (1 mg total) by mouth daily. (Patient not taking: Reported on 03/12/2021), Disp: 30 tablet, Rfl: 1   levothyroxine (SYNTHROID) 88 MCG tablet, Take 1 tablet (88 mcg total) by mouth at bedtime., Disp: 90 tablet, Rfl: 3   Multiple Vitamin (MULTIVITAMIN WITH MINERALS) TABS tablet, Take 1 tablet by mouth at bedtime., Disp: , Rfl:   Clinical Interview:   The following information was obtained during a clinical interview with Ms. Kassel and her significant other [name] prior to her first evaluation in May 2021. Sections have been updated where appropriate to reflect current functioning.  Cognitive Symptoms: Decreased short-term memory: Endorsed. She previously  described increased forgetfulness and some trouble remembering the details of previous conversations. She also acknowledged some trouble misplacing objects around her home. She stated that she could not remember how long memory difficulties have been present. Currently, she acknowledged similar memory concerns and acknowledged that these had likely worsened since previous testing. Her significant other [name] noted that deficits have been present for several years but have seemed to worsen over the past 1-2 years. Decreased long-term memory: Denied. Decreased attention/concentration: Endorsed. She noted that attention and concentration abilities had also seemingly diminished since her previous evaluation. Her primary example included her attempting to read but finding that her mind would unintentionally wander, making this act very difficult to perform.  Reduced processing speed: Endorsed. Difficulties with executive functions: Endorsed. Previously, difficulties surrounding organization were said to have emerged during the past 2-3 years. [name] also alluded to Ms. Germain having a tendency to hang onto and collect things, which worsen organizational efforts. Trouble with impulsivity or decision making was denied. Overt personality changes were also denied. This was said to have remained unchanged.  Difficulties with emotion regulation: Denied. Difficulties with receptive language: Denied. Difficulties with word finding: Denied. Decreased visuoperceptual ability: Denied.   Difficulties completing ADLs: Largely denied. She continued to report independence with medication and financial management. [name] did not contradict her report. However, there is mention in prior records of him expressing concern surrounding adequate medication management. She reported that she continues to drive to local, familiar locations without noted difficulty.   Additional Medical History: History of traumatic brain  injury/concussion: Unclear. As described above, she experienced a syncopal episode in March 2021 while at the grocery store, causing her to fall and hit her head. Head CT was negative for any intracranial abnormalities at that time. She attributed her syncopal episode to her not eating breakfast prior to going to the store and being very hungry. Persisting difficulties were denied. No other potential concussive events were reported.  History of stroke: Denied. History  of seizure activity: Denied. History of known exposure to toxins: Denied. Symptoms of chronic pain: Denied. Experience of frequent headaches/migraines: Denied. Frequent instances of dizziness/vertigo: Denied. Occasional symptoms were said to be present when standing up quickly. However, these were described as fleeting and not overly concerning. This was said to be unchanged.    Sensory changes: Denied. [name] did allude to potential visual acuity concerns, stating that Ms. Reale will occasionally make comments suggesting double vision or trouble seeing things clearly.  Balance/coordination difficulties: Denied. Other motor difficulties: Denied.   Other medical conditions: She was diagnosed with breast cancer around 1993 and was treated with chemotherapy. She has also been diagnosed with ovarian cancer, both around 26 and 2004. Persisting cognitive deficits stemming from cancer treatments were denied; however, cognitive issues were acknowledged during chemotherapy treatment.   Sleep History: Estimated hours obtained each night: 7-8 hours.  Difficulties falling asleep: Endorsed. These difficulties were said to have persisted throughout her entire life. She denied current medication treatments surrounding improving her ability to fall asleep quickly. This was said to be unchanged.  Difficulties staying asleep: Denied. Feels rested and refreshed upon awakening: Endorsed.   History of snoring: Denied. History of waking up gasping  for air: Denied. Witnessed breath cessation while asleep: Denied.   History of vivid dreaming: Denied. Excessive movement while asleep: Denied. Instances of acting out her dreams: Denied.  Psychiatric/Behavioral Health History: Depression: Denied. She described her current mood as "crabby," attributing this to continued isolation due to the ongoing COVID-19 pandemic. Outside of this, she denied prior mental health concerns or diagnoses to her knowledge. Current or remote suicidal ideation, intent, or plan was also denied.  Anxiety: Denied. Mania: Denied. Trauma History: Denied. Visual/auditory hallucinations: Denied. Delusional thoughts: Denied.   Tobacco: Denied. She reportedly quit in the distant past.  Alcohol: She reported very seldom alcohol consumption and denied a history of problematic alcohol abuse or dependence.  Recreational drugs: Denied.  Family History: Problem Relation Age of Onset   Cancer Mother        ovarian; history of melanoma    Aneurysm Father        abdominal aortic   Heart disease Father    Diverticulitis Brother    Breast cancer Maternal Aunt    This information was confirmed by Ms. Fakhouri.  Academic/Vocational History: Highest level of educational attainment: 18 years. She earned a Dietitian in art and a Master's degree in counseling. She described herself as a strong (A/B, mostly A) student in academic settings. No relative weaknesses were identified.  History of developmental delay: Denied. History of grade repetition: Denied. Enrollment in special education courses: Denied. History of LD/ADHD: Denied.   Employment: Retired. 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   Evaluation Results:   Behavioral Observations: Ms. Getty was accompanied by her significant other [name], arrived to her appointment on time, and was appropriately dressed and groomed. She appeared alert and  oriented. Observed gait and station were within normal limits. Gross motor functioning appeared intact upon informal observation and no abnormal movements (e.g., tremors) were noted. Her affect was generally relaxed and positive. Spontaneous speech was fluent and word finding difficulties were not observed during the clinical interview. Thought processes were coherent, organized, and normal in content. Insight into her cognitive difficulties appeared adequate. However, during interview she was noted to repeatedly attribute memory loss and cognitive changes solely due to the ongoing pandemic.  During testing, she reported ongoing visual acuity concerns, stating that "one eye is going up while the other is looking down." This is despite denying any vision changes during interview and disagreeing with [name] when he expressed concern surrounding visual acuity. She stated that these difficulties affected performance across several tasks (TMT, Coding, Line Orientation, Dot Counting). Sustained attention was appropriate. Task engagement was variable. There were instances where she refused to complete memory recognition trials due to not remembering previously learned information. She also became frustrated during the latter portion of the D-KEFS Color Word task and self-discontinued. Overall, Ms. Narducci was cooperative with the clinical interview and subsequent testing procedures.   Adequacy of Effort: The validity of neuropsychological testing is limited by the extent to which the individual being tested may be assumed to have exerted adequate effort during testing. Ms. State expressed her intention to perform to the best of her abilities and exhibited adequate task engagement and persistence. Scores across stand-alone and embedded performance validity measures were variable. However, poor performance across Dot Counting may have been due to ongoing visual acuity concerns, while her other below expectation  performance is likely due to true, severe memory impairment. As such, the results of the current evaluation are believed to be a valid representation of Ms. Mumford's current cognitive functioning.  Test Results: Ms. Zakarian was poorly oriented at the time of the current evaluation. She stated the current month and year as "November 2021." She was also unable to state the current date, day of the week, time, or current location.   Intellectual abilities based upon educational and vocational attainment were estimated to be in the average to above average range. Premorbid abilities were estimated to be within the above average range based upon a single-word reading test.   Processing speed was variable, ranging from the exceptionally low to average normative ranges. Basic attention was well above average to exceptionally high. More complex attention (e.g., working memory) was average. Executive functioning was generally appropriate with most scores ranging from the average to exceptionally high normative ranges. She performed in the well below average range on a visuomotor sequencing task; however, visual acuity concerns may have impacted performance. She scored in the well above average range on a task assessing safety and judgment.   Assessed receptive language abilities were above average. Likewise, Ms. Clinger did not exhibit any difficulties comprehending task instructions and answered all questions asked of her appropriately. Assessed expressive language (e.g., verbal fluency and confrontation naming) was variable but largely appropriate with scores ranging from the below average to well above average normative ranges. Semantic fluency was consistently the weakest of these performances.    Assessed visuospatial/visuoconstructional abilities were below average to above average. Points were lost on her drawing of a clock due to the clock numbers being placed outside of the outer circle. Points  were lost on her copy of a complex figure due to mild visual distortions and one internal aspect being omitted entirely.    Learning (i.e., encoding) of novel verbal information was below average. Spontaneous delayed recall (i.e., retrieval) of previously learned information was exceptionally low. Retention rates were 0% across a story learning task, 0% across a list learning task, and 0% across a figure drawing task. Performance across recognition tasks was exceptionally low, suggesting very minimal evidence for information consolidation.   Results of emotional screening instruments suggested that recent symptoms of generalized anxiety were in the minimal range, while symptoms of depression were within normal limits. A screening instrument  assessing recent sleep quality suggested the presence of minimal sleep dysfunction.  Tables of Scores:   Note: This summary of test scores accompanies the interpretive report and should not be considered in isolation without reference to the appropriate sections in the text. Descriptors are based on appropriate normative data and may be adjusted based on clinical judgment. Terms such as "Within Normal Limits" and "Outside Normal Limits" are used when a more specific description of the test score cannot be determined. Descriptors refer to the current evaluation only.         Percentile - Normative Descriptor > 98 - Exceptionally High 91-97 - Well Above Average 75-90 - Above Average 25-74 - Average 9-24 - Below Average 2-8 - Well Below Average < 2 - Exceptionally Low        Validity:    DESCRIPTOR   May 2021 Current    Dot Counting Test: --- --- --- Outside Normal Limits  RBANS Effort Index: --- --- --- Outside Normal Limits  WAIS-IV Reliable Digit Span: --- --- --- Within Normal Limits        Orientation:       Raw Score Raw Score Percentile   NAB Orientation, Form 1 25/29 19/29 --- ---        Cognitive Screening:       Raw Score Raw Score Percentile    SLUMS: --- 18/30 --- ---        RBANS, Form A: Standard Score/ Scaled Score Standard Score/ Scaled Score Percentile   Total Score 85 77 6 Well Below Average  Immediate Memory 103 81 10 Below Average    List Learning '10 7 16 ' Below Average    Story Memory '11 6 9 ' Below Average  Visuospatial/Constructional 112 96 39 Average    Figure Copy '11 6 9 ' Below Average    Line Orientation 18/20 18/20 >75 Above Average  Language 97 92 30 Average    Picture Naming 9/10 9/10 26-50 Average    Semantic Fluency '8 6 9 ' Below Average  Attention 97 97 42 Average    Digit Span 15 17 99 Exceptionally High    Coding 4 2 <1 Exceptionally Low  Delayed Memory 40 40 <1 Exceptionally Low    List Recall 0/10 0/10 <2 Exceptionally Low    List Recognition 11/20 10/20 <2 Exceptionally Low    Story Recall 1 1 <1 Exceptionally Low    Story Recognition 4/12 Pt refused  (no recollection) --- Impaired    Figure Recall 1 1 <1 Exceptionally Low    Figure Recognition 0/8 Pt refused  (no recollection) --- Impaired        Intellectual Functioning:       Standard Score Standard Score Percentile   Test of Premorbid Functioning: 113 113 81 Above Average        Attention/Executive Function:      Trail Making Test (TMT): Raw Score (T Score) Raw Score (T Score) Percentile     Part A 86 secs.,  0 errors (23) 78 secs.,  1 error (27) 1 Exceptionally Low    Part B 122 secs.,  0 errors (44) 183 secs.,  1 error (35) 7 Well Below Average         Scaled Score Scaled Score Percentile   WAIS-IV Digit Span: 15 11 63 Average    Forward 17 14 91 Well Above Average    Backward 15 10 50 Average    Sequencing 11 10 50 Average  D-KEFS Color-Word Interference Test: Raw Score (Scaled Score) Raw Score (Scaled Score) Percentile     Color Naming 34 secs. (11) 36 secs. (10) 50 Average    Word Reading 28 secs. (9) 29 secs. (9) 37 Average    Inhibition 59 secs. (14) 61 secs. (14) 57 Well Above Average      Total Errors 4 errors  (9) 4 errors (9) 37 Average    Inhibition/Switching 121 secs. (7) Discontinued --- Impaired      Total Errors 10 errors (4) --- --- ---        [place] Card Sorting Test: Raw Score Raw Score Percentile     Categories (trials) 1 (64) 4 (64) >16 Within Normal Limits    Total Errors 28 14 >99 Exceptionally High    Perseverative Errors 14 7 >99 Exceptionally High    Non-Perseverative Errors 14 7 88 Above Average    Failure to Maintain Set 1 0 --- ---        NAB Executive Functions Module, Form 1: T Score T Score Percentile     Judgment 50 67 96 Well Above Average        Language:      Verbal Fluency Test: Raw Score (Scaled Score) Raw Score (Scaled Score) Percentile     Phonemic Fluency (CFL) 18 (6) 32 (10) 50 Average    Category Fluency 36 (10) 29 (7) 16 Below Average  *Based on [employer] Older Normative Studies (MOANS)            NAB Language Module, Form 1: T Score T Score Percentile     Auditory Comprehension 57 57 75 Above Average    Naming 31/31 (64) 31/31 (64) 92 Well Above Average        Visuospatial/Visuoconstruction:       Raw Score Raw Score Percentile   Clock Drawing: 9/10 8/10 --- Within Normal Limits         Scaled Score Scaled Score Percentile   WAIS-IV Block Design: --- 11 63 Average        Mood and Personality:       Raw Score Raw Score Percentile   Geriatric Depression Scale: 6 7 --- Within Normal Limits  Geriatric Anxiety Scale: 8 4 --- Minimal    Somatic 2 2 --- Minimal    Cognitive 4 1 --- Minimal    Affective 2 1 --- Minimal        Additional Questionnaires:       Raw Score Raw Score Percentile   PROMIS Sleep Disturbance Questionnaire: 17 13 --- None to Slight   Informed Consent and Coding/Compliance:   The current evaluation represents a clinical evaluation for the purposes previously outlined by the referral source and is in no way reflective of a forensic evaluation.   Ms. Govea was provided with a verbal description of the nature and purpose of  the present neuropsychological evaluation. Also reviewed were the foreseeable risks and/or discomforts and benefits of the procedure, limits of confidentiality, and mandatory reporting requirements of this provider. The patient was given the opportunity to ask questions and receive answers about the evaluation. Oral consent to participate was provided by the patient.   This evaluation was conducted by Christia Reading, Ph.D., ABPP-CN, board certified clinical neuropsychologist. Ms. Orr completed a clinical interview with Dr. Melvyn Novas, billed as one unit [phone], and 130 minutes of cognitive testing and scoring, billed as one unit [phone] and three additional units [id number]. Psychometrist Milana Kidney, B.S., assisted Dr. Melvyn Novas with  test administration and scoring procedures. As a separate and discrete service, Dr. Melvyn Novas spent a total of 160 minutes in interpretation and report writing billed as one unit [phone] and two units 96133.

## 2021-05-13 ENCOUNTER — Other Ambulatory Visit: Payer: Self-pay

## 2021-05-13 ENCOUNTER — Ambulatory Visit (INDEPENDENT_AMBULATORY_CARE_PROVIDER_SITE_OTHER): Payer: Medicare HMO | Admitting: Psychology

## 2021-05-13 DIAGNOSIS — H538 Other visual disturbances: Secondary | ICD-10-CM | POA: Diagnosis not present

## 2021-05-13 DIAGNOSIS — G3184 Mild cognitive impairment, so stated: Secondary | ICD-10-CM | POA: Diagnosis not present

## 2021-05-13 DIAGNOSIS — G309 Alzheimer's disease, unspecified: Secondary | ICD-10-CM | POA: Diagnosis not present

## 2021-05-13 DIAGNOSIS — R4189 Other symptoms and signs involving cognitive functions and awareness: Secondary | ICD-10-CM

## 2021-05-13 NOTE — Progress Notes (Signed)
° °  Neuropsychology Feedback Session [name]. [place] [facility] of Neurology  Reason for Referral:   [name] is a 80 y.o. right-handed Caucasian female referred by [name], M.D., to characterize her current cognitive functioning and assist with diagnostic clarity and treatment planning in the context of prior diagnosis of an amnestic mild cognitive impairment and concern surrounding continued cognitive decline.   Feedback:   [name] completed a comprehensive neuropsychological evaluation on 05/02/2021. Please refer to that encounter for the full report and recommendations. Briefly, results suggested profound impairment surrounding delayed retrieval and recognition/consolidation aspects of memory. Performance variability was exhibited across processing speed and executive functioning. Regarding the latter, impairments were seen across cognitive flexibility and response inhibition. A further weakness was noted encoding (i.e., learning) newly presented information. Relative to her previous evaluation, cognitive decline was consistently observed across encoding aspects of memory, cognitive flexibility, and semantic fluency. Consistent decline was also observed across attention/concentration; however, these scores remained appropriate normatively. Variable decline was noted across processing speed and response inhibition. Variable decline was also noted across visuospatial abilities; however, some of this could be due to ongoing visual acuity concerns. Regarding etiology, notable concerns remain surrounding underlying Alzheimer's disease.  [name] was accompanied by her significant other [name] during the current telephone call. They were within her residence while I was within my office. I discussed the limitations of evaluation and management by telemedicine and the availability of in person appointments. [name] expressed her understanding and agreed  to proceed. Content of the current session focused on the results of her neuropsychological evaluation. [name] and [name] were given the opportunity to ask questions and their questions were answered. They were encouraged to reach out should additional questions arise. A copy of her report was mailed at the conclusion of the visit.      20 minutes were spent conducting the current feedback session with [name], billed as one unit [phone].

## 2021-06-04 ENCOUNTER — Telehealth: Payer: Medicare HMO

## 2021-06-09 ENCOUNTER — Ambulatory Visit: Payer: Medicare HMO | Admitting: Neurology

## 2021-07-02 ENCOUNTER — Telehealth: Payer: Medicare HMO

## 2021-07-04 ENCOUNTER — Encounter: Payer: Medicare HMO | Admitting: Psychology

## 2021-07-10 ENCOUNTER — Encounter: Payer: Medicare HMO | Admitting: Psychology

## 2021-07-21 ENCOUNTER — Ambulatory Visit: Payer: Medicare HMO | Admitting: Family Medicine

## 2021-07-21 ENCOUNTER — Telehealth: Payer: Self-pay | Admitting: Family Medicine

## 2021-07-21 NOTE — Telephone Encounter (Signed)
Pt called and wants to reschedule appt from 07/23/21 with [facility] to 07/28/21 on the same day she has an appt with Dr Ethelene Hal, she wants to do both the same day. Call back is [phone] for pts husband. I wasn't sure who to send this to so I sent to person who scheduled it

## 2021-07-22 ENCOUNTER — Other Ambulatory Visit: Payer: Self-pay

## 2021-07-22 ENCOUNTER — Encounter: Payer: Self-pay | Admitting: Neurology

## 2021-07-22 ENCOUNTER — Ambulatory Visit: Payer: Medicare HMO | Admitting: Neurology

## 2021-07-22 VITALS — BP 107/63 | HR 87 | Ht 68.0 in | Wt 144.8 lb

## 2021-07-22 DIAGNOSIS — G3184 Mild cognitive impairment, so stated: Secondary | ICD-10-CM

## 2021-07-22 MED ORDER — MEMANTINE HCL 10 MG PO TABS: ORAL_TABLET | ORAL | 11 refills | Status: DC

## 2021-07-22 MED ORDER — DONEPEZIL HCL 10 MG PO TABS: 10.0000 mg | ORAL_TABLET | Freq: Every day | ORAL | 3 refills | Status: DC

## 2021-07-22 NOTE — Patient Instructions (Signed)
Good to see you.  Start Memantine 10mg : take 1 tablet every night for 2 weeks, then increase to 1 tablet twice a day  2. Continue Donepezil 10mg  daily  3. Start using your pillbox regularly  4. Schedule MRI brain without contrast  5. Follow-up in 6 months, call for any changes   FALL PRECAUTIONS: Be cautious when walking. Scan the area for obstacles that may increase the risk of trips and falls. When getting up in the mornings, sit up at the edge of the bed for a few minutes before getting out of bed. Consider elevating the bed at the head end to avoid drop of blood pressure when getting up. Walk always in a well-lit room (use night lights in the walls). Avoid area rugs or power cords from appliances in the middle of the walkways. Use a walker or a cane if necessary and consider physical therapy for balance exercise. Get your eyesight checked regularly.  FINANCIAL OVERSIGHT: Supervision, especially oversight when making financial decisions or transactions is also recommended as difficulties arise.   HOME SAFETY: Consider the safety of the kitchen when operating appliances like stoves, microwave oven, and blender. Consider having supervision and share cooking responsibilities until no longer able to participate in those. Accidents with firearms and other hazards in the house should be identified and addressed as well.  DRIVING: Regarding driving, in patients with progressive memory problems, driving will be impaired. We advise to have someone else do the driving if trouble finding directions or if minor accidents are reported. Independent driving assessment is available to determine safety of driving.  ABILITY TO BE LEFT ALONE: If patient is unable to contact 911 operator, consider using LifeLine, or when the need is there, arrange for someone to stay with patients. Smoking is a fire hazard, consider supervision or cessation. Risk of wandering should be assessed by caregiver and if detected at  any point, supervision and safe proof recommendations should be instituted.  MEDICATION SUPERVISION: Inability to self-administer medication needs to be constantly addressed. Implement a mechanism to ensure safe administration of the medications.  RECOMMENDATIONS FOR ALL PATIENTS WITH MEMORY PROBLEMS: 1. Continue to exercise (Recommend 30 minutes of walking everyday, or 3 hours every week) 2. Increase social interactions - continue going to [place] and enjoy social gatherings with friends and family 3. Eat healthy, avoid fried foods and eat more fruits and vegetables 4. Maintain adequate blood pressure, blood sugar, and blood cholesterol level. Reducing the risk of stroke and cardiovascular disease also helps promoting better memory. 5. Avoid stressful situations. Live a simple life and avoid aggravations. Organize your time and prepare for the next day in anticipation. 6. Sleep well, avoid any interruptions of sleep and avoid any distractions in the bedroom that may interfere with adequate sleep quality 7. Avoid sugar, avoid sweets as there is a strong link between excessive sugar intake, diabetes, and cognitive impairment We discussed the Mediterranean diet, which has been shown to help patients reduce the risk of progressive memory disorders and reduces cardiovascular risk. This includes eating fish, eat fruits and green leafy vegetables, nuts like almonds and hazelnuts, walnuts, and also use olive oil. Avoid fast foods and fried foods as much as possible. Avoid sweets and sugar as sugar use has been linked to worsening of memory function.  There is always a concern of gradual progression of memory problems. If this is the case, then we may need to adjust level of care according to patient needs. Support, both to the patient  and caregiver, should then be put into place.      Mediterranean Diet  Why follow it? Research shows Those who follow the Mediterranean diet have a reduced risk of heart  disease  The diet is associated with a reduced incidence of Parkinson's and Alzheimer's diseases People following the diet may have longer life expectancies and lower rates of chronic diseases  The Dietary Guidelines for Americans recommends the Mediterranean diet as an eating plan to promote health and prevent disease  What Is the Mediterranean Diet?  Healthy eating plan based on typical foods and recipes of Mediterranean-style cooking The diet is primarily a plant based diet; these foods should make up a majority of meals   Starches - Plant based foods should make up a majority of meals - They are an important sources of vitamins, minerals, energy, antioxidants, and fiber - Choose whole grains, foods high in fiber and minimally processed items  - Typical grain sources include wheat, oats, barley, corn, brown rice, bulgar, farro, millet, polenta, couscous  - Various types of beans include chickpeas, lentils, fava beans, black beans, white beans   Fruits  Veggies - Large quantities of antioxidant rich fruits & veggies; 6 or more servings  - Vegetables can be eaten raw or lightly drizzled with oil and cooked  - Vegetables common to the traditional Mediterranean Diet include: artichokes, arugula, beets, broccoli, brussel sprouts, cabbage, carrots, celery, collard greens, cucumbers, eggplant, kale, leeks, lemons, lettuce, mushrooms, okra, onions, peas, peppers, potatoes, pumpkin, radishes, rutabaga, shallots, spinach, sweet potatoes, turnips, zucchini - Fruits common to the Mediterranean Diet include: apples, apricots, avocados, cherries, clementines, dates, figs, grapefruits, grapes, melons, nectarines, oranges, peaches, pears, pomegranates, strawberries, tangerines  Fats - Replace butter and margarine with healthy oils, such as olive oil, canola oil, and tahini  - Limit nuts to no more than a handful a day  - Nuts include walnuts, almonds, pecans, pistachios, pine nuts  - Limit or avoid candied,  honey roasted or heavily salted nuts - Olives are central to the [employer] - can be eaten whole or used in a variety of dishes   Meats Protein - Limiting red meat: no more than a few times a month - When eating red meat: choose lean cuts and keep the portion to the size of deck of cards - Eggs: approx. 0 to 4 times a week  - Fish and lean poultry: at least 2 a week  - Healthy protein sources include, chicken, [place], lean beef, lamb - Increase intake of seafood such as tuna, salmon, trout, mackerel, shrimp, scallops - Avoid or limit high fat processed meats such as sausage and bacon  Dairy - Include moderate amounts of low fat dairy products  - Focus on healthy dairy such as fat free yogurt, skim milk, low or reduced fat cheese - Limit dairy products higher in fat such as whole or 2% milk, cheese, ice cream  Alcohol - Moderate amounts of red wine is ok  - No more than 5 oz daily for women (all ages) and men older than age 22  - No more than 10 oz of wine daily for men younger than 106  Other - Limit sweets and other desserts  - Use herbs and spices instead of salt to flavor foods  - Herbs and spices common to the traditional Mediterranean Diet include: basil, bay leaves, chives, cloves, cumin, fennel, garlic, lavender, marjoram, mint, oregano, parsley, pepper, rosemary, sage, savory, sumac, tarragon, thyme   Its not just  a diet, its a lifestyle:  The Mediterranean diet includes lifestyle factors typical of those in the region  Foods, drinks and meals are best eaten with others and savored Daily physical activity is important for overall good health This could be strenuous exercise like running and aerobics This could also be more leisurely activities such as walking, housework, yard-work, or taking the stairs Moderation is the key; a balanced and healthy diet accommodates most foods and drinks Consider portion sizes and frequency of consumption of certain foods   Meal Ideas &  Options:  Breakfast:  Whole wheat toast or whole wheat English muffins with peanut butter & hard boiled egg Steel cut oats topped with apples & cinnamon and skim milk  Fresh fruit: banana, strawberries, melon, berries, peaches  Smoothies: strawberries, bananas, greek yogurt, peanut butter Low fat greek yogurt with blueberries and granola  Egg white omelet with spinach and mushrooms Breakfast couscous: whole wheat couscous, apricots, skim milk, cranberries  Sandwiches:  Hummus and grilled vegetables (peppers, zucchini, squash) on whole wheat bread   Grilled chicken on whole wheat pita with lettuce, tomatoes, cucumbers or tzatziki  [place] salad on whole wheat bread: tuna salad made with greek yogurt, olives, red peppers, capers, green onions Garlic rosemary lamb pita: lamb sauted with garlic, rosemary, salt & pepper; add lettuce, cucumber, greek yogurt to pita - flavor with lemon juice and black pepper  Seafood:  Mediterranean grilled salmon, seasoned with garlic, basil, parsley, lemon juice and black pepper Shrimp, lemon, and spinach whole-grain pasta salad made with low fat greek yogurt  Seared scallops with lemon orzo  Seared tuna steaks seasoned salt, pepper, coriander topped with tomato mixture of olives, tomatoes, olive oil, minced garlic, parsley, green onions and cappers  Meats:  Herbed greek chicken salad with kalamata olives, cucumber, feta  Red bell peppers stuffed with spinach, bulgur, lean ground beef (or lentils) & topped with feta   Kebabs: skewers of chicken, tomatoes, onions, zucchini, squash  [place] burgers: made with red onions, mint, dill, lemon juice, feta cheese topped with roasted red peppers Vegetarian Cucumber salad: cucumbers, artichoke hearts, celery, red onion, feta cheese, tossed in olive oil & lemon juice  Hummus and whole grain pita points with a greek salad (lettuce, tomato, feta, olives, cucumbers, red onion) Lentil soup with celery, carrots made with  vegetable broth, garlic, salt and pepper  Tabouli salad: parsley, bulgur, mint, scallions, cucumbers, tomato, radishes, lemon juice, olive oil, salt and pepper.
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
## 2021-07-23 ENCOUNTER — Telehealth: Payer: Medicare HMO

## 2021-07-23 NOTE — Chronic Care Management (AMB) (Signed)
?  Care Management  ? ?Note ? ?07/23/2021 ?Name: [name] MRN: [id number] DOB: [birth year] ? ?[name] is a 81 y.o. year old female who is a primary care patient of [name], MD and is actively engaged with the care management team. I reached out to [name] by phone today to assist with re-scheduling a follow up visit with the RN Case Manager ? ?Follow up plan: ?Telephone appointment with care management team member scheduled for: 07/30/2021 ? ?Kamali Sakata, CCMA ?Care Guide, Embedded Care Coordination ?[facility]  Care Management  ?Direct Dial: [phone] ? ? ?

## 2021-07-25 ENCOUNTER — Telehealth (HOSPITAL_BASED_OUTPATIENT_CLINIC_OR_DEPARTMENT_OTHER): Payer: Self-pay

## 2021-07-26 ENCOUNTER — Ambulatory Visit (HOSPITAL_BASED_OUTPATIENT_CLINIC_OR_DEPARTMENT_OTHER): Payer: Medicare HMO

## 2021-07-28 ENCOUNTER — Encounter: Payer: Self-pay | Admitting: Family Medicine

## 2021-07-28 ENCOUNTER — Telehealth: Payer: Self-pay

## 2021-07-28 ENCOUNTER — Ambulatory Visit (INDEPENDENT_AMBULATORY_CARE_PROVIDER_SITE_OTHER): Payer: Medicare HMO | Admitting: Family Medicine

## 2021-07-28 ENCOUNTER — Other Ambulatory Visit: Payer: Self-pay

## 2021-07-28 VITALS — BP 104/80 | HR 90 | Temp 97.7°F | Ht 68.0 in | Wt 144.4 lb

## 2021-07-28 DIAGNOSIS — R7309 Other abnormal glucose: Secondary | ICD-10-CM | POA: Diagnosis not present

## 2021-07-28 DIAGNOSIS — R413 Other amnesia: Secondary | ICD-10-CM | POA: Diagnosis not present

## 2021-07-28 DIAGNOSIS — E039 Hypothyroidism, unspecified: Secondary | ICD-10-CM

## 2021-07-28 NOTE — Progress Notes (Signed)
Established Patient Office Visit  Subjective:  Patient ID: [name], female    DOB: 07/12/1940  Age: 81 y.o. MRN: [id number]  CC:  Chief Complaint  Patient presents with   Follow-up    6 month follow up , no concerns    HPI [name] presents for follow-up of hypothyroidism, elevated glucose and cognitive decline.  Recently started on Namenda by neurology.  She is accompanied by her significant other who confirms her forgetfulness.  Continues with levothyroxine at bedtime 4 hours after her evening meal.  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 1993; Adenocarcinoma   History of malignant neoplasm of ovary    1998 and 2004   Hypokalemia 01/17/2021   Hypothyroidism 05/11/2011   Peripheral neuropathy 09/06/2012   Syncopal episodes 08/04/2018   Vasomotor instability 09/16/2014   Vitamin D deficiency     Past Surgical History:  Procedure Laterality Date   BIOPSY THYROID     for benign disease   BREAST SURGERY     CHOLECYSTECTOMY     COLON SURGERY     colectomy for cancer   colonscopy  2004   2/08   left ankle fracture     status post surgery, March 2010   MASTECTOMY  1992   left mastectomy   PORT-A-CATH REMOVAL Right 04/15/2015   Procedure: REMOVAL PORT-A-CATH;  Surgeon: [name], MD;  Location: [facility];  Service: General;  Laterality: Right;  Right upper chest   TOTAL ABDOMINAL HYSTERECTOMY W/ BILATERAL SALPINGOOPHORECTOMY  1998   TAH for ovarian cancer    Family History  Problem Relation Age of Onset   Cancer Mother         ovarian; history of melanoma    Aneurysm Father        abdominal aortic   Heart disease Father    Diverticulitis Brother    Breast cancer Maternal Aunt     Social History   Socioeconomic History   Marital status: Single    Spouse name: Not on file   Number of children: 2   Years of education: 18   Highest education level: Master's degree (e.g., MA, MS, MEng, MEd, MSW, MBA)  Occupational History   Occupation: Retired  Tobacco Use   Smoking status: Former    Types: Cigarettes    Quit date: 05/26/1991    Years since quitting: 30.1   Smokeless tobacco: Never  Vaping Use   Vaping Use: Never used  Substance and Sexual Activity   Alcohol use: Yes    Comment: social   Drug use: No   Sexual activity: Not on file  Other Topics Concern   Not on file  Social History Narrative   March 1998- history of stage III ovarian cancer; status post suboptimal debulking; status post 6 cycles of Taxoil and carboplatin with complete remission documented by second look surgery September 1998.      Recurrent ovarian cancer, status post 6 cycles carbo and Taxol, April 1998 until July 2008      1992, left breast intraductal carcinoma (DCIS)   Status post  left modified radial mastectomy and with silicon implant   13 of 13 lymph modes negative; positive for BCRA1 gene   Recurrent ovarian carinoma, February 2008   Admit Feb. 2009 partial small bowel obstruction treated medically      Right handed    Social Determinants of Health   Financial Resource Strain: Not on file  Food Insecurity: Not on file  Transportation Needs: Not on file  Physical Activity: Not on file  Stress: Not on file  Social Connections: Not on file  Intimate Partner Violence: Not on file    Outpatient Medications Prior to Visit  Medication Sig Dispense Refill   Aspirin-Acetaminophen-Caffeine (EXCEDRIN MIGRAINE PO) Take 1 tablet by mouth daily as needed (headache).     donepezil (ARICEPT) 10 MG tablet Take 1 tablet (10  mg total) by mouth at bedtime. 90 tablet 3   estradiol (ESTRACE) 1 MG tablet Take 1 tablet (1 mg total) by mouth daily. 30 tablet 1   levothyroxine (SYNTHROID) 88 MCG tablet Take 1 tablet (88 mcg total) by mouth at bedtime. 90 tablet 3   Multiple Vitamin (MULTIVITAMIN WITH MINERALS) TABS tablet Take 1 tablet by mouth at bedtime.     memantine (NAMENDA) 10 MG tablet Take 1 tablet every night for 2 weeks, then increase to 1 tablet twice a day 60 tablet 11   No facility-administered medications prior to visit.    Allergies  Allergen Reactions   Codeine Sulfate Nausea And Vomiting    Projectile vomiting   Promethazine Hcl Other (See Comments)    Unknown reaction    ROS Review of Systems  Constitutional:  Negative for diaphoresis, fatigue, fever and unexpected weight change.  HENT: Negative.    Eyes:  Negative for photophobia and visual disturbance.  Respiratory: Negative.    Cardiovascular: Negative.   Gastrointestinal: Negative.   Endocrine: Negative for cold intolerance, heat intolerance, polyphagia and polyuria.  Genitourinary: Negative.   Neurological:  Negative for speech difficulty and weakness.  Psychiatric/Behavioral:  Positive for decreased concentration.      Objective:    Physical Exam Vitals and nursing note reviewed.  Constitutional:      General: She is not in acute distress.    Appearance: Normal appearance. She is normal weight. She is not ill-appearing, toxic-appearing or diaphoretic.  HENT:     Head: Normocephalic and atraumatic.     Right Ear: External ear normal.     Left Ear: External ear normal.     Mouth/Throat:     Mouth: Mucous membranes are moist.     Pharynx: Oropharynx is clear. No oropharyngeal exudate or posterior oropharyngeal erythema.  Eyes:     General: No scleral icterus.       Right eye: No discharge.        Left eye: No discharge.     Extraocular Movements: Extraocular movements intact.     Conjunctiva/sclera: Conjunctivae normal.      Pupils: Pupils are equal, round, and reactive to light.  Neck:     Vascular: No carotid bruit.  Cardiovascular:     Rate and Rhythm: Normal rate and regular rhythm.  Pulmonary:     Effort: Pulmonary effort is normal.     Breath sounds: Normal breath sounds.  Abdominal:     General: Bowel sounds are normal.  Musculoskeletal:     Cervical back: No rigidity or tenderness.  Lymphadenopathy:     Cervical: No cervical adenopathy.  Skin:    General: Skin is warm and dry.  Neurological:     Mental Status: She is alert and oriented to person, place, and time.  Psychiatric:        Mood and Affect: Mood normal.        Behavior: Behavior normal.    BP 104/80 (BP Location: Right Arm, Patient Position: Sitting, Cuff Size: Normal)    Pulse 90    Temp 97.7 F (36.5 C) (Temporal)    Ht _0  (1.727 m)    Wt 144 lb 6.4 oz (65.5 kg)    SpO2 97%    BMI 21.96 kg/m  Wt Readings from Last 3 Encounters:  07/28/21 144 lb 6.4 oz (65.5 kg)  07/22/21 144 lb 12.8 oz (65.7 kg)  01/17/21 140 lb 12.8 oz (63.9 kg)     Health Maintenance Due  Topic Date Due   DEXA SCAN  Never done    There are no preventive care reminders to display for this patient.  Lab Results  Component Value Date   TSH 1.18 01/17/2021   Lab Results  Component Value Date   WBC 5.8 12/27/2020   HGB 15.8 (H) 12/27/2020   HCT 47.1 (H) 12/27/2020   MCV 93.6 12/27/2020   PLT  12/27/2020    PLATELET CLUMPS NOTED ON SMEAR, COUNT APPEARS ADEQUATE   Lab Results  Component Value Date   NA 140 01/17/2021   K 3.8 01/17/2021   CHLORIDE 104 12/17/2016   CO2 25 01/17/2021   GLUCOSE 94 01/17/2021   BUN 16 01/17/2021   CREATININE 1.01 (H) 01/17/2021   BILITOT 0.8 12/27/2020   ALKPHOS 81 12/27/2020   AST 11 (L) 12/27/2020   ALT 11 12/27/2020   PROT 6.8 12/27/2020   ALBUMIN 3.7 12/27/2020   CALCIUM 9.6 01/17/2021   ANIONGAP 10 12/27/2020   EGFR 50 (L) 12/17/2016   GFR 58.16 (L) 08/04/2018   Lab Results  Component Value Date    CHOL 219 (H) 01/17/2021   Lab Results  Component Value Date   HDL 52 01/17/2021   Lab Results  Component Value Date   LDLCALC 140 (H) 01/17/2021   Lab Results  Component Value Date   TRIG 138 01/17/2021   Lab Results  Component Value Date   CHOLHDL 4.2 01/17/2021   Lab Results  Component Value Date   HGBA1C 5.0 01/17/2021      Assessment & Plan:   Problem List Items Addressed This Visit       Endocrine   Hypothyroidism - Primary   Relevant Orders   TSH     Other   Elevated glucose   Relevant Orders   Basic metabolic panel   Memory impairment   Relevant Orders   CBC    No orders of the defined types were placed in this encounter.   Follow-up: Return in about 6 months (around 01/28/2022).  Will adjust levothyroxine as needed. Rechecking glucose. Please start namenda.   [name], MD

## 2021-07-28 NOTE — Telephone Encounter (Signed)
I confirmed with [name], he did cancel due to high copayment, may reconsider after next visit with you.FYI  ?

## 2021-07-28 NOTE — Telephone Encounter (Signed)
Can you pls confirm with her significant other [name], and document reason for decline? Thanks! ?

## 2021-07-28 NOTE — Telephone Encounter (Signed)
Mri brain wo contrast was scheduled at [facility] authorization was obtained by [employer], Authroization [id number], cpt code [phone]. Patient husband did not want to reschedule per [facility].  ?

## 2021-07-29 LAB — CBC
HCT: 47.9 % — ABNORMAL HIGH (ref 36.0–46.0)
Hemoglobin: 15.9 g/dL — ABNORMAL HIGH (ref 12.0–15.0)
MCHC: 33.1 g/dL (ref 30.0–36.0)
MCV: 93.9 fl (ref 78.0–100.0)
Platelets: 233 10*3/uL (ref 150.0–400.0)
RBC: 5.11 Mil/uL (ref 3.87–5.11)
RDW: 12.6 % (ref 11.5–15.5)
WBC: 5.6 10*3/uL (ref 4.0–10.5)

## 2021-07-29 LAB — BASIC METABOLIC PANEL
BUN: 15 mg/dL (ref 6–23)
CO2: 25 mEq/L (ref 19–32)
Calcium: 9.6 mg/dL (ref 8.4–10.5)
Chloride: 103 mEq/L (ref 96–112)
Creatinine, Ser: 0.98 mg/dL (ref 0.40–1.20)
GFR: 53.96 mL/min — ABNORMAL LOW (ref >60.00)
Glucose, Bld: 122 mg/dL — ABNORMAL HIGH (ref 70–99)
Potassium: 4 mEq/L (ref 3.5–5.1)
Sodium: 138 mEq/L (ref 135–145)

## 2021-07-29 LAB — TSH: TSH: 1.32 u[IU]/mL (ref 0.35–5.50)

## 2021-07-30 ENCOUNTER — Telehealth: Payer: Medicare HMO

## 2021-07-30 ENCOUNTER — Telehealth: Payer: Self-pay

## 2021-07-30 NOTE — Telephone Encounter (Signed)
?  Care Management  ? ?Follow Up Note ? ? ?07/30/2021 ?Name: [name] MRN: [id number] DOB: 11-06-1940 ? ? ?Referred by: [name], MD ?Reason for referral : Chronic Care Management ? ? ?Successful outreach was made with patient. HIPAA verified. Patient states she is unable to keep telephone appointment  ? ?Follow Up Plan: The care management team will reach out to the patient again over the next 2 weeks.  ? ?Quinn Plowman RN,BSN,CCM ?RN Case Manager ?[place] ?[phone] ? ?

## 2021-08-06 ENCOUNTER — Ambulatory Visit (INDEPENDENT_AMBULATORY_CARE_PROVIDER_SITE_OTHER): Payer: Medicare HMO

## 2021-08-06 DIAGNOSIS — E785 Hyperlipidemia, unspecified: Secondary | ICD-10-CM

## 2021-08-06 DIAGNOSIS — E039 Hypothyroidism, unspecified: Secondary | ICD-10-CM

## 2021-08-06 DIAGNOSIS — R413 Other amnesia: Secondary | ICD-10-CM

## 2021-08-06 NOTE — Patient Instructions (Signed)
Visit Information ? ?Thank you for taking time to visit with me today. Please don't hesitate to contact me if I can be of assistance to you before our next scheduled telephone appointment. ? ?Following are the goals we discussed today:  ?Continue to take your medication as prescribed and refill timely ?Follow up with your provider as recommended ?Notify your doctor for symptoms:   ? restlessness, rapid weight loss, and sweating ?Low temperature, low blood sugar, slow breathing, not responsive ?Feeling very tired ?Puffy hands, face, or feet ?Irregular heart beat ?Confusion ?You are not feeling better in 2 to 3 days or you are feeling worse ?Call provider office for new concerns or questions ?Continue to incorporate strategies to manage memory loss (crossword puzzels, active social life, managing health conditions, healthy diet, good quality sleep, write on pill bottle reason for taking, keep written medication and provider appointment list in sight such as on your refrigerator) ? ?Our next appointment is by telephone on 10/01/21 at 1:00 pm ? ?Please call the care guide team at [phone] if you need to cancel or reschedule your appointment.  ? ?If you are experiencing a Mental Health or [place] or need someone to talk to, please call the Suicide and Crisis Lifeline: 988 ?call 1-800-273-TALK (toll free, 24 hour hotline)  ? ?Patient verbalizes understanding of instructions and care plan provided today and agrees to view in [facility]. Active MyChart status confirmed with patient.   ? ?Quinn Plowman RN,BSN,CCM ?RN Case Manager ?[place]  ?[phone] ? ?

## 2021-08-06 NOTE — Chronic Care Management (AMB) (Signed)
?Chronic Care Management  ? ?CCM RN Visit Note ? ?08/06/2021 ?Name: [name] MRN: [id number] DOB: 1941/04/02 ? ?Subjective: ?[name] is a 81 y.o. year old female who is a primary care patient of [name], MD. The care management team was consulted for assistance with disease management and care coordination needs.   ? ?Engaged with patient by telephone for follow up visit in response to provider referral for case management and/or care coordination services.  ? ?Consent to Services:  ?The patient was given information about Chronic Care Management services, agreed to services, and gave verbal consent prior to initiation of services.  Please see initial visit note for detailed documentation.  ? ?Patient agreed to services and verbal consent obtained.  ? ?Assessment: Review of patient past medical history, allergies, medications, health status, including review of consultants reports, laboratory and other test data, was performed as part of comprehensive evaluation and provision of chronic care management services.  ? ?SDOH (Social Determinants of Health) assessments and interventions performed:   ? ?CCM Care Plan ? ?Allergies  ?Allergen Reactions  ? Codeine Sulfate Nausea And Vomiting  ?  Projectile vomiting  ? Promethazine Hcl Other (See Comments)  ?  Unknown reaction  ? ? ?Outpatient Encounter Medications as of 08/06/2021  ?Medication Sig  ? Aspirin-Acetaminophen-Caffeine (EXCEDRIN MIGRAINE PO) Take 1 tablet by mouth daily as needed (headache).  ? donepezil (ARICEPT) 10 MG tablet Take 1 tablet (10 mg total) by mouth at bedtime.  ? estradiol (ESTRACE) 1 MG tablet Take 1 tablet (1 mg total) by mouth daily.  ? levothyroxine (SYNTHROID) 88 MCG tablet Take 1 tablet (88 mcg total) by mouth at bedtime.  ? memantine (NAMENDA) 10 MG tablet Take 1 tablet every night for 2 weeks, then increase to 1 tablet twice a day  ? Multiple Vitamin (MULTIVITAMIN WITH MINERALS) TABS tablet Take 1 tablet by  mouth at bedtime.  ? ?No facility-administered encounter medications on file as of 08/06/2021.  ? ? ?Patient Active Problem List  ? Diagnosis Date Noted  ? Memory impairment 07/28/2021  ? Elevated glucose 01/17/2021  ? Estrogen deficiency 01/17/2021  ? Dyslipidemia 01/17/2021  ? Hypokalemia 01/17/2021  ? 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 Syncopal episodes 08/04/2018  ? 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 Vasomotor instability 09/16/2014  ? BRCA1 positive 03/21/2014  ? Vitamin D deficiency   ? Peripheral neuropathy 09/06/2012  ? Hypothyroidism 05/11/2011  ? History of malignant neoplasm of breast 02/29/2008  ? History of malignant neoplasm of ovary 02/29/2008  ? Abdominal pain, generalized 06/23/2007  ? ? ?Conditions to be addressed/monitored:Hypothyroidism and Memory impairment ? ?Care Plan : RN - Plan of Care  ?Updates made by Dannielle Karvonen, RN since 08/06/2021 12:00 AM  ?  ? ?Problem: Chronic disease management, education and care coordination needs ( Memory impairment, hypothyroidism)   ?Priority: High  ?  ? ?Long-Range Goal: Development of  plan of care to address chronic disease management and care coordination needs ( Memory Impairment, hypothyroidism)   ?Start Date: 03/12/2021  ?Expected End Date: 10/22/2021  ?Priority: High  ?Note:   ?Current Barriers:  ?Knowledge Deficits related to plan of care for management of Memory Impairment, hypothyroidism ?Chronic Disease Management support and education needs related to Memory Impairment, hypothyrodism ?Cognitive Deficits ? ?RNCM Clinical Goal(s):  ?Patient will verbalize understanding of plan for management of Memory Impairment, hypothyroidism ?verbalize basic understanding of  Memory Impairment, hypothyroidism  disease process and self health management plan    through collaboration with RN Care manager, provider, and care team.  ? ?Interventions: ?1:1 collaboration with primary care provider  regarding development and update of comprehensive plan of care as evidenced by provider attestation and co-signature ?Inter-disciplinary care team collaboration (see longitudinal plan of care) ?Evaluation of current treatment plan related to  self management and patient's adherence to plan as established by provider ? ?Memory Impairment. Goal on track:  Yes. Long term Goal ?Evaluation of current treatment plan related to  Cognitive decline , self-management and patient's adherence to plan as established by provider. ?Discussed plans with patient/ caregiver for ongoing care management follow up and provided patient with direct contact information for care management team ?Reviewed medications with patient and discussed importance of compliance ?Reviewed scheduled/upcoming provider appointments: Per chart review patient has follow up appointment with neurologist on 01/20/22 ?Discussed plans with patient for ongoing care management follow up and provided patient with direct contact information for care management team; ?Provided education to patient and caregiver regarding strategies to manage (crossword puzzels, active social life, managing health conditions, healthy diet, quality sleep) ?Discussed memory loss strategies ( write on pill bottles what medication is for, keep written list of medications on refrigerator, list of appointments on refrigerator)  ?Discussed with patient and designated party release, Maryanna Shape importance of discussing long term plan for care for / if progressing memory issues.  ? ? ?HypothyroidismNew goal. ?Evaluation of current treatment plan related to Hypothyroidism, self-management and patient's adherence to plan as established by provider. ?Discussed plans with patient for ongoing care management follow up and provided patient with direct contact information for care management team ?Reviewed medications with patient and discussed importance of compliance ?Reviewed scheduled/upcoming  provider appointments :  Per chart review patient has follow up with primary care provider on 01/29/22 ?Discussed hypothyroidism signs/ symptoms and when to call doctor ? ?Patient Goals/ Self-Care Activities: ?Continue to take your medication as prescribed and refill timely ?Follow up with your provider as recommended ?Notify your doctor for symptoms:   ? restlessness, rapid weight loss, and sweating ?Low temperature, low blood sugar, slow breathing, not responsive ?Feeling very tired ?Puffy hands, face, or feet ?Irregular heart beat ?Confusion ?You are not feeling better in 2 to 3 days or you are feeling worse ?Call provider office for new concerns or questions ?Continue to incorporate strategies to manage memory loss (crossword puzzels, active social life, managing health conditions, healthy diet, good quality sleep, write on pill bottle reason for taking, keep written medication and provider appointment list in sight such as on your refrigerator) ? ? ? ?  ? ? ?Plan:The patient has been provided with contact information for the care management team and has been advised to call with any health related questions or concerns.  ?The care management team will reach out to the patient again over the next 1-2 months . ?Quinn Plowman RN,BSN,CCM ?RN Case Manager ?[employer] ?[phone] ? ? ? ? ? ? ? ? ? ?

## 2021-08-25 DIAGNOSIS — Z961 Presence of intraocular lens: Secondary | ICD-10-CM | POA: Diagnosis not present

## 2021-08-25 DIAGNOSIS — H5212 Myopia, left eye: Secondary | ICD-10-CM | POA: Diagnosis not present

## 2021-08-25 DIAGNOSIS — H524 Presbyopia: Secondary | ICD-10-CM | POA: Diagnosis not present

## 2021-08-27 NOTE — Telephone Encounter (Signed)
Close encounter 

## 2021-08-28 ENCOUNTER — Other Ambulatory Visit: Payer: Self-pay | Admitting: Neurology

## 2021-09-23 ENCOUNTER — Ambulatory Visit (INDEPENDENT_AMBULATORY_CARE_PROVIDER_SITE_OTHER): Payer: Medicare HMO

## 2021-09-23 VITALS — Ht 68.0 in | Wt 144.0 lb

## 2021-09-23 DIAGNOSIS — Z0000 Encounter for general adult medical examination without abnormal findings: Secondary | ICD-10-CM

## 2021-09-23 DIAGNOSIS — Z13820 Encounter for screening for osteoporosis: Secondary | ICD-10-CM

## 2021-09-23 NOTE — Patient Instructions (Addendum)
?[name] , ?Thank you for taking time to come for your Medicare Wellness Visit. I appreciate your ongoing commitment to your health goals. Please review the following plan we discussed and let me know if I can assist you in the future.  ? ?These are the goals we discussed: ? Goals   ? ?   Increase physical activity (pt-stated)   ?   Walking more ?  ?   Patient Stated   ?   Would like to start walking ? ?  ? ?  ?  ?This is a list of the screening recommended for you and due dates:  ?Health Maintenance  ?Topic Date Due  ? DEXA scan (bone density measurement)  09/24/2022*  ? Flu Shot  12/23/2021  ? Mammogram  03/26/2022  ? Tetanus Vaccine  06/27/2022  ? Zoster (Shingles) Vaccine  Completed  ? HPV Vaccine  Aged Out  ? Pneumonia Vaccine  Discontinued  ? COVID-19 Vaccine  Discontinued  ?*Topic was postponed. The date shown is not the original due date.  ? ?Advanced directives: No  ? ?Conditions/risks identified: None ? ?Next appointment: Follow up in one year for your annual wellness visit  ? ? ? ?Preventive Care 80 Years and Older, Female ?Preventive care refers to lifestyle choices and visits with your health care provider that can promote health and wellness. ?What does preventive care include? ?A yearly physical exam. This is also called an annual well check. ?Dental exams once or twice a year. ?Routine eye exams. Ask your health care provider how often you should have your eyes checked. ?Personal lifestyle choices, including: ?Daily care of your teeth and gums. ?Regular physical activity. ?Eating a healthy diet. ?Avoiding tobacco and drug use. ?Limiting alcohol use. ?Practicing safe sex. ?Taking low-dose aspirin every day. ?Taking vitamin and mineral supplements as recommended by your health care provider. ?What happens during an annual well check? ?The services and screenings done by your health care provider during your annual well check will depend on your age, overall health, lifestyle risk factors, and  family history of disease. ?Counseling  ?Your health care provider may ask you questions about your: ?Alcohol use. ?Tobacco use. ?Drug use. ?Emotional well-being. ?Home and relationship well-being. ?Sexual activity. ?Eating habits. ?History of falls. ?Memory and ability to understand (cognition). ?Work and work Statistician. ?Reproductive health. ?Screening  ?You may have the following tests or measurements: ?Height, weight, and BMI. ?Blood pressure. ?Lipid and cholesterol levels. These may be checked every 5 years, or more frequently if you are over 77 years old. ?Skin check. ?Lung cancer screening. You may have this screening every year starting at age 56 if you have a 30-pack-year history of smoking and currently smoke or have quit within the past 15 years. ?Fecal occult blood test (FOBT) of the stool. You may have this test every year starting at age 61. ?Flexible sigmoidoscopy or colonoscopy. You may have a sigmoidoscopy every 5 years or a colonoscopy every 10 years starting at age 52. ?Hepatitis C blood test. ?Hepatitis B blood test. ?Sexually transmitted disease (STD) testing. ?Diabetes screening. This is done by checking your blood sugar (glucose) after you have not eaten for a while (fasting). You may have this done every 1-3 years. ?Bone density scan. This is done to screen for osteoporosis. You may have this done starting at age 83. ?Mammogram. This may be done every 1-2 years. Talk to your health care provider about how often you should have regular mammograms. ?Talk with your health care  provider about your test results, treatment options, and if necessary, the need for more tests. ?Vaccines  ?Your health care provider may recommend certain vaccines, such as: ?Influenza vaccine. This is recommended every year. ?Tetanus, diphtheria, and acellular pertussis (Tdap, Td) vaccine. You may need a Td booster every 10 years. ?Zoster vaccine. You may need this after age 103. ?Pneumococcal 13-valent conjugate  (PCV13) vaccine. One dose is recommended after age 68. ?Pneumococcal polysaccharide (PPSV23) vaccine. One dose is recommended after age 61. ?Talk to your health care provider about which screenings and vaccines you need and how often you need them. ?This information is not intended to replace advice given to you by your health care provider. Make sure you discuss any questions you have with your health care provider. ?Document Released: 06/07/2015 Document Revised: 01/29/2016 Document Reviewed: 03/12/2015 ?Elsevier Interactive Patient Education ? 2017 [place]. ? ?Fall Prevention in the Home ?Falls can cause injuries. They can happen to people of all ages. There are many things you can do to make your home safe and to help prevent falls. ?What can I do on the outside of my home? ?Regularly fix the edges of walkways and driveways and fix any cracks. ?Remove anything that might make you trip as you walk through a door, such as a raised step or threshold. ?Trim any bushes or trees on the path to your home. ?Use bright outdoor lighting. ?Clear any walking paths of anything that might make someone trip, such as rocks or tools. ?Regularly check to see if handrails are loose or broken. Make sure that both sides of any steps have handrails. ?Any raised decks and porches should have guardrails on the edges. ?Have any leaves, snow, or ice cleared regularly. ?Use sand or salt on walking paths during winter. ?Clean up any spills in your garage right away. This includes oil or grease spills. ?What can I do in the bathroom? ?Use night lights. ?Install grab bars by the toilet and in the tub and shower. Do not use towel bars as grab bars. ?Use non-skid mats or decals in the tub or shower. ?If you need to sit down in the shower, use a plastic, non-slip stool. ?Keep the floor dry. Clean up any water that spills on the floor as soon as it happens. ?Remove soap buildup in the tub or shower regularly. ?Attach bath mats securely with  double-sided non-slip rug tape. ?Do not have throw rugs and other things on the floor that can make you trip. ?What can I do in the bedroom? ?Use night lights. ?Make sure that you have a light by your bed that is easy to reach. ?Do not use any sheets or blankets that are too big for your bed. They should not hang down onto the floor. ?Have a firm chair that has side arms. You can use this for support while you get dressed. ?Do not have throw rugs and other things on the floor that can make you trip. ?What can I do in the kitchen? ?Clean up any spills right away. ?Avoid walking on wet floors. ?Keep items that you use a lot in easy-to-reach places. ?If you need to reach something above you, use a strong step stool that has a grab bar. ?Keep electrical cords out of the way. ?Do not use floor polish or wax that makes floors slippery. If you must use wax, use non-skid floor wax. ?Do not have throw rugs and other things on the floor that can make you trip. ?What can I  do with my stairs? ?Do not leave any items on the stairs. ?Make sure that there are handrails on both sides of the stairs and use them. Fix handrails that are broken or loose. Make sure that handrails are as long as the stairways. ?Check any carpeting to make sure that it is firmly attached to the stairs. Fix any carpet that is loose or worn. ?Avoid having throw rugs at the top or bottom of the stairs. If you do have throw rugs, attach them to the floor with carpet tape. ?Make sure that you have a light switch at the top of the stairs and the bottom of the stairs. If you do not have them, ask someone to add them for you. ?What else can I do to help prevent falls? ?Wear shoes that: ?Do not have high heels. ?Have rubber bottoms. ?Are comfortable and fit you well. ?Are closed at the toe. Do not wear sandals. ?If you use a stepladder: ?Make sure that it is fully opened. Do not climb a closed stepladder. ?Make sure that both sides of the stepladder are locked  into place. ?Ask someone to hold it for you, if possible. ?Clearly mark and make sure that you can see: ?Any grab bars or handrails. ?First and last steps. ?Where the edge of each step is. ?Use tools that help

## 2021-09-23 NOTE — Progress Notes (Signed)
? ?Subjective:  ? [name] is a 81 y.o. female who presents for Medicare Annual (Subsequent) preventive examination. ? ?Review of Systems    ?Virtual Visit via Telephone Note ? ?I connected with  [name] on 09/23/21 at  2:45 PM EDT by telephone and verified that I am speaking with the correct person using two identifiers. ? ?Location: ?Patient: Home ?Provider: Office ?Persons participating in the virtual visit: patient/Nurse Health Advisor ?  ?I discussed the limitations, risks, security and privacy concerns of performing an evaluation and management service by telephone and the availability of in person appointments. The patient expressed understanding and agreed to proceed. ? ?Interactive audio and video telecommunications were attempted between this nurse and patient, however failed, due to patient having technical difficulties OR patient did not have access to video capability.  We continued and completed visit with audio only. ? ?Some vital signs may be absent or patient reported.  ? ?Criselda Peaches, LPN  ?Cardiac Risk Factors include: advanced age (>53mn, >>19women) ? ?   ?Objective:  ?  ?Today's Vitals  ? 09/23/21 1610  ?Weight: 144 lb (65.3 kg)  ?Height: '5\' 8"'  (1.727 m)  ? ?Body mass index is 21.9 kg/m?. ? ? ?  09/23/2021  ?  4:22 PM 07/22/2021  ?  3:34 PM 03/12/2021  ?  2:13 PM 11/06/2020  ?  1:10 PM 03/06/2020  ?  2:27 PM 11/29/2019  ?  2:07 PM  ?Advanced Directives  ?Does Patient Have a Medical Advance Directive? No No No No No No  ?Would patient like information on creating a medical advance directive? No - Patient declined  No - Patient declined   Yes (MAU/Ambulatory/Procedural Areas - Information given)  ? ? ?Current Medications (verified) ?Outpatient Encounter Medications as of 09/23/2021  ?Medication Sig  ? Aspirin-Acetaminophen-Caffeine (EXCEDRIN MIGRAINE PO) Take 1 tablet by mouth daily as needed (headache).  ? donepezil (ARICEPT) 10 MG tablet Take 1 tablet (10 mg total) by mouth at  bedtime.  ? estradiol (ESTRACE) 1 MG tablet Take 1 tablet (1 mg total) by mouth daily.  ? levothyroxine (SYNTHROID) 88 MCG tablet Take 1 tablet (88 mcg total) by mouth at bedtime.  ? memantine (NAMENDA) 10 MG tablet TAKE 1 TABLET BY MOUTH EVERY NIGHT FOR 2WEEKS THEN INCREASE TO 1 TABLET TWICE A DAY  ? Multiple Vitamin (MULTIVITAMIN WITH MINERALS) TABS tablet Take 1 tablet by mouth at bedtime.  ? ?No facility-administered encounter medications on file as of 09/23/2021.  ? ? ?Allergies (verified) ?Codeine sulfate and Promethazine hcl  ? ?History: ?Past Medical History:  ?Diagnosis Date  ? Abdominal pain, generalized 06/23/2007  ? 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 BRCA1 positive 03/21/2014  ? Dyslipidemia 01/17/2021  ? Elevated glucose 01/17/2021  ? Estrogen deficiency 01/17/2021  ? Facial laceration 01/17/2021  ? 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 History of malignant neoplasm of breast   ? Approx. 1993; Adenocarcinoma  ? History of malignant neoplasm of ovary   ? 1998 and 2004  ? Hypokalemia 01/17/2021  ? Hypothyroidism 05/11/2011  ? Peripheral neuropathy 09/06/2012  ? Syncopal episodes 08/04/2018  ? Vasomotor instability 09/16/2014  ? Vitamin D deficiency   ? ?Past Surgical History:  ?Procedure Laterality Date  ? BIOPSY THYROID    ? for benign disease  ? BREAST SURGERY    ? CHOLECYSTECTOMY    ? COLON SURGERY    ? colectomy for cancer  ?  colonscopy  2004  ? 2/08  ? left ankle fracture    ? status post surgery, March 2010  ? MASTECTOMY  1992  ? left mastectomy  ? PORT-A-CATH REMOVAL Right 04/15/2015  ? Procedure: REMOVAL PORT-A-CATH;  Surgeon: [name], MD;  Location: [facility];  Service: General;  Laterality: Right;  Right upper chest  ? TOTAL ABDOMINAL HYSTERECTOMY W/ BILATERAL SALPINGOOPHORECTOMY  1998  ? TAH for ovarian cancer  ? ?Family History  ?Problem Relation Age of Onset  ? Cancer Mother   ?     ovarian; history of  melanoma   ? Aneurysm Father   ?     abdominal aortic  ? Heart disease Father   ? Diverticulitis Brother   ? Breast cancer Maternal Aunt   ? ?Social History  ? ?Socioeconomic History  ? Marital status: Single  ?  Spouse name: Not on file  ? Number of children: 2  ? Years of education: 75  ? Highest education level: Master's degree (e.g., MA, MS, MEng, MEd, MSW, MBA)  ?Occupational History  ? Occupation: Retired  ?Tobacco Use  ? Smoking status: Former  ?  Types: Cigarettes  ?  Quit date: 05/26/1991  ?  Years since quitting: 30.3  ? Smokeless tobacco: Never  ?Vaping Use  ? Vaping Use: Never used  ?Substance and Sexual Activity  ? Alcohol use: Yes  ?  Comment: social  ? Drug use: No  ? Sexual activity: Not on file  ?Other Topics Concern  ? Not on file  ?Social History Narrative  ? March 1998- history of stage III ovarian cancer; status post suboptimal debulking; status post 6 cycles of Taxoil and carboplatin with complete remission documented by second look surgery September 1998.  ?   ? Recurrent ovarian cancer, status post 6 cycles carbo and Taxol, April 1998 until July 2008  ?   ? 1992, left breast intraductal carcinoma (DCIS)  ? Status post left modified radial mastectomy and with silicon implant  ? 13 of 13 lymph modes negative; positive for BCRA1 gene  ? Recurrent ovarian carinoma, February 2008  ? Admit Feb. 2009 partial small bowel obstruction treated medically  ?   ? Right handed   ? ?Social Determinants of Health  ? ?Financial Resource Strain: Low Risk   ? Difficulty of Paying Living Expenses: Not hard at all  ?Food Insecurity: No Food Insecurity  ? Worried About Charity fundraiser in the Last Year: Never true  ? Ran Out of Food in the Last Year: Never true  ?Transportation Needs: No Transportation Needs  ? Lack of Transportation (Medical): No  ? Lack of Transportation (Non-Medical): No  ?Physical Activity: Sufficiently Active  ? Days of Exercise per Week: 5 days  ? Minutes of Exercise per Session: 30 min   ?Stress: No Stress Concern Present  ? Feeling of Stress : Not at all  ?Social Connections: Socially Isolated  ? Frequency of Communication with Friends and Family: More than three times a week  ? Frequency of Social Gatherings with Friends and Family: More than three times a week  ? Attends Religious Services: Never  ? Active Member of Clubs or Organizations: No  ? Attends Archivist Meetings: Never  ? Marital Status: Divorced  ? ? ?Clinical Intake: ? ? ?Diabetic?  No ? ?Interpreter Needed?: No ? ?Activities of Daily Living ? ?  09/23/2021  ?  4:19 PM  ?In your present state of health, do you have  any difficulty performing the following activities:  ?Hearing? 0  ?Vision? 0  ?Difficulty concentrating or making decisions? 0  ?Walking or climbing stairs? 0  ?Dressing or bathing? 0  ?Doing errands, shopping? 0  ?Preparing Food and eating ? N  ?Using the Toilet? N  ?In the past six months, have you accidently leaked urine? N  ?Do you have problems with loss of bowel control? N  ?Managing your Medications? N  ?Managing your Finances? N  ?Housekeeping or managing your Housekeeping? N  ? ? ?Patient Care Team: ?[name], MD as PCP - General (Family Medicine) ?Cameron Sprang, MD as Consulting Physician (Neurology) ?Elease Hashimoto (Neurology) ?Dannielle Karvonen, RN as [place] Management ? ?Indicate any recent Medical Services you may have received from other than [facility] providers in the past year (date may be approximate). ? ?   ?Assessment:  ? This is a routine wellness examination for [name]. ? ?Hearing/Vision screen ?Hearing Screening - Comments:: No hearing difficulty ?Vision Screening - Comments:: Wears glasses. Followed by Dr Rebecca Eaton ? ?Dietary issues and exercise activities discussed: ?Exercise limited by: None identified ? ? Goals Addressed   ? ?  ?  ?  ?  ?  ? This Visit's Progress  ?   Increase physical activity (pt-stated)     ?   Walking more ?  ? ?  ? ?Depression  Screen ? ?  09/23/2021  ?  4:16 PM 07/28/2021  ?  3:01 PM 03/12/2021  ?  2:20 PM 01/17/2021  ?  4:48 PM 01/17/2021  ?  3:19 PM 11/29/2019  ?  2:08 PM 09/22/2017  ? 12:46 PM  ?PHQ 2/9 Scores  ?PHQ - 2 Score 0 0 2 3 0 0 0

## 2021-09-24 DIAGNOSIS — L814 Other melanin hyperpigmentation: Secondary | ICD-10-CM | POA: Diagnosis not present

## 2021-09-24 DIAGNOSIS — L821 Other seborrheic keratosis: Secondary | ICD-10-CM | POA: Diagnosis not present

## 2021-09-24 DIAGNOSIS — L430 Hypertrophic lichen planus: Secondary | ICD-10-CM | POA: Diagnosis not present

## 2021-09-24 DIAGNOSIS — Z85828 Personal history of other malignant neoplasm of skin: Secondary | ICD-10-CM | POA: Diagnosis not present

## 2021-09-24 DIAGNOSIS — C44722 Squamous cell carcinoma of skin of right lower limb, including hip: Secondary | ICD-10-CM | POA: Diagnosis not present

## 2021-09-24 DIAGNOSIS — L570 Actinic keratosis: Secondary | ICD-10-CM | POA: Diagnosis not present

## 2021-09-24 DIAGNOSIS — D485 Neoplasm of uncertain behavior of skin: Secondary | ICD-10-CM | POA: Diagnosis not present

## 2021-10-01 ENCOUNTER — Telehealth: Payer: Medicare HMO

## 2021-10-01 ENCOUNTER — Telehealth: Payer: Self-pay

## 2021-10-01 NOTE — Telephone Encounter (Signed)
?  Care Management  ? ?Follow Up Note ? ? ?10/01/2021 ?Name: [name] MRN: [id number] DOB: Oct 08, 1940 ? ? ?Referred by: [name], MD ?Reason for referral : Chronic Care Management ? ? ?An unsuccessful telephone outreach was attempted today. The patient was referred to the case management team for assistance with care management and care coordination.  ? ?Follow Up Plan: A HIPPA compliant phone message was left for the patient providing contact information and requesting a return call. The care management team will attempt telephone outreach to patient within 2 weeks.  ? ?. ?Quinn Plowman RN,BSN,CCM ?RN Case Manager ?[place] ?[phone] ? ?

## 2021-10-22 ENCOUNTER — Ambulatory Visit (INDEPENDENT_AMBULATORY_CARE_PROVIDER_SITE_OTHER): Payer: Medicare HMO

## 2021-10-22 DIAGNOSIS — R413 Other amnesia: Secondary | ICD-10-CM

## 2021-10-22 DIAGNOSIS — E785 Hyperlipidemia, unspecified: Secondary | ICD-10-CM

## 2021-10-22 DIAGNOSIS — E039 Hypothyroidism, unspecified: Secondary | ICD-10-CM

## 2021-10-22 NOTE — Chronic Care Management (AMB) (Signed)
Chronic Care Management   CCM RN Visit Note  10/22/2021 Name: [name] MRN: [id number] DOB: 14-Mar-1941  Subjective: [name] is a 81 y.o. year old female who is a primary care patient of [name], MD. The care management team was consulted for assistance with disease management and care coordination needs.    Engaged with patient by telephone for follow up visit in response to provider referral for case management and/or care coordination services.   Consent to Services:  The patient was given information about Chronic Care Management services, agreed to services, and gave verbal consent prior to initiation of services.  Please see initial visit note for detailed documentation.   Patient agreed to services and verbal consent obtained.   Assessment: Review of patient past medical history, allergies, medications, health status, including review of consultants reports, laboratory and other test data, was performed as part of comprehensive evaluation and provision of chronic care management services.   SDOH (Social Determinants of Health) assessments and interventions performed:    CCM Care Plan  Allergies  Allergen Reactions   Codeine Sulfate Nausea And Vomiting    Projectile vomiting   Promethazine Hcl Other (See Comments)    Unknown reaction    Outpatient Encounter Medications as of 10/22/2021  Medication Sig   Aspirin-Acetaminophen-Caffeine (EXCEDRIN MIGRAINE PO) Take 1 tablet by mouth daily as needed (headache).   donepezil (ARICEPT) 10 MG tablet Take 1 tablet (10 mg total) by mouth at bedtime.   estradiol (ESTRACE) 1 MG tablet Take 1 tablet (1 mg total) by mouth daily.   levothyroxine (SYNTHROID) 88 MCG tablet Take 1 tablet (88 mcg total) by mouth at bedtime.   memantine (NAMENDA) 10 MG tablet TAKE 1 TABLET BY MOUTH EVERY NIGHT FOR 2WEEKS THEN INCREASE TO 1 TABLET TWICE A DAY   Multiple Vitamin (MULTIVITAMIN WITH MINERALS) TABS tablet Take 1  tablet by mouth at bedtime.   No facility-administered encounter medications on file as of 10/22/2021.    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   Vasomotor instability 09/16/2014   BRCA1 positive 03/21/2014   Vitamin D deficiency    Peripheral neuropathy 09/06/2012   Hypothyroidism 05/11/2011   History of malignant neoplasm of breast 02/29/2008   History of malignant neoplasm of ovary 02/29/2008   Abdominal pain, generalized 06/23/2007    Conditions to be addressed/monitored: Hypothyroidism, Memory impairment  Care Plan : RN - Plan of Care  Updates made by Dannielle Karvonen, RN since 10/22/2021 12:00 AM     Problem: Chronic disease management, education and care coordination needs ( Memory impairment, hypothyroidism)   Priority: High     Long-Range Goal: Development of  plan of care to address chronic disease management and care coordination needs ( Memory Impairment, hypothyroidism)   Start Date: 03/12/2021  Expected End Date: 01/22/2022  Priority: High  Note:   Current Barriers:  Knowledge Deficits related to plan of care for management of Memory Impairment, hypothyroidism Chronic Disease Management support and education needs related to Memory Impairment, hypothyrodism Cognitive Deficits Patient denies any concerns. She states she is taking her medications as prescribed.  Reviewed follow up appointments with RNCM.   Patient reports  her next follow up visit with her primary care provider is 01/29/22.  Patient states she keeps her appointments in her phone.  RNCM Clinical Goal(s):  Patient will verbalize understanding of plan for management of Memory  Impairment, hypothyroidism verbalize basic understanding of  Memory Impairment, hypothyroidism disease process and self health management plan    through collaboration with RN Care manager, provider, and care team.   Interventions: 1:1 collaboration with primary care provider regarding development and update of comprehensive plan of care as evidenced by provider attestation and co-signature Inter-disciplinary care team collaboration (see longitudinal plan of care) Evaluation of current treatment plan related to  self management and patient's adherence to plan as established by provider  Memory Impairment. Goal on track:  Yes. Long term Goal Evaluation of current treatment plan related to  Cognitive decline , self-management and patient's adherence to plan as established by provider. Discussed plans with patient/ caregiver for ongoing care management follow up and provided patient with direct contact information for care management team Reviewed medications with patient and discussed importance of compliance Reviewed scheduled/upcoming provider appointments: Per chart review patient has follow up appointment with neurologist on 01/20/22 Discussed plans with patient for ongoing care management follow up and provided patient with direct contact information for care management team; Provided education to patient and caregiver regarding strategies to manage (crossword puzzels, active social life, managing health conditions, healthy diet, quality sleep) Re-Discussed memory loss strategies ( write on pill bottles what medication is for, keep written list of medications on refrigerator, list of appointments on refrigerator)   Hypothyroidism: Goal on track:  Yes. Long term goal Evaluation of current treatment plan related to Hypothyroidism, self-management and patient's adherence to plan as established by provider. Discussed plans with patient for ongoing care management follow up and provided patient with  direct contact information for care management team Reviewed medications with patient and discussed importance of compliance Reviewed scheduled/upcoming provider appointments  Per chart review patient has follow up with primary care provider on 01/29/22 Discussed hypothyroidism signs/ symptoms and when to call doctor  Patient Goals/ Self-Care Activities: Continue to take your medication as prescribed and refill timely Follow up with your provider as recommended Notify your doctor for hypoglycemic symptoms:     restlessness, rapid weight loss, and sweating Low temperature, low blood sugar, slow breathing, not responsive Feeling very tired Puffy hands, face, or feet Irregular heart beat Confusion You are not feeling better in 2 to 3 days or you are feeling worse Call provider office for new concerns or questions Notify your doctor for any new or ongoing symptoms.  Continue to incorporate strategies to manage memory loss (crossword puzzels, active social life, managing health conditions, healthy diet, good quality sleep, write on pill bottle reason for taking, keep written medication and provider appointment list in sight such as on your refrigerator)        Plan:The patient has been provided with contact information for the care management team and has been advised to call with any health related questions or concerns.  The care management team will reach out to the patient again over the next 2 months . Quinn Plowman RN,BSN,CCM RN Case Manager [place] [phone]

## 2021-10-22 NOTE — Patient Instructions (Signed)
Visit Information  Thank you for taking time to visit with me today. Please don't hesitate to contact me if I can be of assistance to you before our next scheduled telephone appointment.  Following are the goals we discussed today:  Continue to take your medication as prescribed and refill timely Follow up with your provider as recommended Notify your doctor for hypoglycemic symptoms:     restlessness, rapid weight loss, and sweating Low temperature, low blood sugar, slow breathing, not responsive Feeling very tired Puffy hands, face, or feet Irregular heart beat Confusion You are not feeling better in 2 to 3 days or you are feeling worse Call provider office for new concerns or questions Notify your doctor for any new or ongoing symptoms.  Continue to incorporate strategies to manage memory loss (crossword puzzels, active social life, managing health conditions, healthy diet, good quality sleep, write on pill bottle reason for taking, keep written medication and provider appointment list in sight such as on your refrigerator)  Our next appointment is by telephone on 12/24/21 at 2:00 pm  Please call the care guide team at [phone] if you need to cancel or reschedule your appointment.   If you are experiencing a Mental Health or [place] or need someone to talk to, please call the Suicide and Crisis Lifeline: 988 call 1-800-273-TALK (toll free, 24 hour hotline)   Patient verbalizes understanding of instructions and care plan provided today and agrees to view in [place]. Active MyChart status and patient understanding of how to access instructions and care plan via MyChart confirmed with patient.     Quinn Plowman RN,BSN,CCM RN Case Manager [place] [phone]

## 2021-12-24 ENCOUNTER — Ambulatory Visit: Payer: Medicare HMO

## 2021-12-25 NOTE — Chronic Care Management (AMB) (Signed)
Care Management    RN Visit Note  12/25/2021 Name: [name] MRN: [id number] DOB: 21-Jul-1940  Subjective: [name] is a 81 y.o. year old female who is a primary care patient of [name], MD. The care management team was consulted for assistance with disease management and care coordination needs.    Engaged with patient by telephone for follow up visit in response to provider referral for case management and/or care coordination services.   Consent to Services:   [name] was given information about Care Management services today including:  Care Management services includes personalized support from designated clinical staff supervised by her physician, including individualized plan of care and coordination with other care providers 24/7 contact phone numbers for assistance for urgent and routine care needs. The patient may stop case management services at any time by phone call to the office staff.  Patient agreed to services and consent obtained.   Assessment: Review of patient past medical history, allergies, medications, health status, including review of consultants reports, laboratory and other test data, was performed as part of comprehensive evaluation and provision of chronic care management services.   SDOH (Social Determinants of Health) assessments and interventions performed:    Care Plan  Allergies  Allergen Reactions   Codeine Sulfate Nausea And Vomiting    Projectile vomiting   Promethazine Hcl Other (See Comments)    Unknown reaction    Outpatient Encounter Medications as of 12/24/2021  Medication Sig   Aspirin-Acetaminophen-Caffeine (EXCEDRIN MIGRAINE PO) Take 1 tablet by mouth daily as needed (headache).   donepezil (ARICEPT) 10 MG tablet Take 1 tablet (10 mg total) by mouth at bedtime.   estradiol (ESTRACE) 1 MG tablet Take 1 tablet (1 mg total) by mouth daily.   levothyroxine (SYNTHROID) 88 MCG tablet Take 1 tablet (88 mcg  total) by mouth at bedtime.   memantine (NAMENDA) 10 MG tablet TAKE 1 TABLET BY MOUTH EVERY NIGHT FOR 2WEEKS THEN INCREASE TO 1 TABLET TWICE A DAY   Multiple Vitamin (MULTIVITAMIN WITH MINERALS) TABS tablet Take 1 tablet by mouth at bedtime.   No facility-administered encounter medications on file as of 12/24/2021.    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   Vasomotor instability 09/16/2014   BRCA1 positive 03/21/2014   Vitamin D deficiency    Peripheral neuropathy 09/06/2012   Hypothyroidism 05/11/2011   History of malignant neoplasm of breast 02/29/2008   History of malignant neoplasm of ovary 02/29/2008   Abdominal pain, generalized 06/23/2007    Conditions to be addressed/monitored:  Memory impairment, Hypothyroidism  Care Plan : RN - Plan of Care  Updates made by Dannielle Karvonen, RN since 12/25/2021 12:00 AM     Problem: Chronic disease management, education and care coordination needs ( Memory impairment, hypothyroidism)   Priority: High     Long-Range Goal: Development of  plan of care to address chronic disease management and care coordination needs ( Memory Impairment, hypothyroidism)   Start Date: 03/12/2021  Expected End Date: 01/22/2022  Priority: High  Note:   Goals met. Case closed Current Barriers:  Knowledge Deficits related to plan of care for management of Memory Impairment, hypothyroidism  Chronic Disease Management support and education needs related to Memory Impairment, hypothyrodism Cognitive Deficits Patient denies any concerns. Patient states she continues to fill her pill box weekly and takes her medications as prescribed.  She  states she keeps her appointments logged in her calendar on her phone.  Patient states her friend [name] continues to assist her as needed.  She states she is taking her medications as prescribed.  Reviewed follow up appointments with RNCM.   Patient reports her next follow up visit with her primary care provider is 01/29/22.  Reviewed care plan goals with patient and determined goals were met.  Patient verbalized agreement to case closure for care management services.  RNCM Clinical Goal(s):  Patient will verbalize understanding of plan for management of Memory Impairment, hypothyroidism verbalize basic understanding of  Memory Impairment, hypothyroidism disease process and self health management plan    through collaboration with RN Care manager, provider, and care team.   Interventions: 1:1 collaboration with primary care provider regarding development and update of comprehensive plan of care as evidenced by provider attestation and co-signature Inter-disciplinary care team collaboration (see longitudinal plan of care) Evaluation of current treatment plan related to  self management and patient's adherence to plan as established by provider  Memory Impairment. Goal Met. Evaluation of current treatment plan related to  Cognitive decline , self-management and patient's adherence to plan as established by provider. Reviewed medications with patient and discussed importance of compliance Reviewed scheduled/upcoming provider appointments:  Reviewed strategies to help with memory (crossword puzzels, active social life, managing health conditions, healthy diet, quality sleep) Re-Discussed memory loss strategies ( write on pill bottles what medication is for, keep written list of medications on refrigerator, list of appointments on refrigerator)   Hypothyroidism: Goal Met. Evaluation of current treatment plan related to Hypothyroidism, self-management and patient's adherence to plan as established  by provider. Reviewed medications with patient and discussed importance of compliance Reviewed scheduled/upcoming provider appointment  Patient Goals/ Self-Care Activities: Continue to take your medication as prescribed and refill timely Follow up with your provider as recommended Notify your doctor for hypoglycemic symptoms:     restlessness, rapid weight loss, and sweating Low temperature, low blood sugar, slow breathing, not responsive Feeling very tired Puffy hands, face, or feet Irregular heart beat Confusion You are not feeling better in 2 to 3 days or you are feeling worse Call provider office for new concerns or questions Notify your doctor for any new or ongoing symptoms.  Continue to incorporate strategies to manage memory loss (crossword puzzels, active social life, managing health conditions, healthy diet, good quality sleep, write on pill bottle reason for taking, keep written medication and provider appointment list in sight such as on your refrigerator)        Plan: No further follow up required: Goals met. Case closed  Quinn Plowman RN,BSN,CCM [place] Coordinator [phone]

## 2021-12-25 NOTE — Patient Instructions (Signed)
Visit Information  [name] Congratulations on achieving your goals! It was a pleasure working with you, and I hope you continue to make great strides in improving your health.   Thank you for allowing me to share the care management and care coordination services that are available to you as part of your health plan and services through your primary care provider and medical home. Please reach out to me at [phone] if the care management/care coordination team may be of assistance to you in the future.   Quinn Plowman RN,BSN,CCM RN Care Manager Coordinator [place] [phone]

## 2022-01-09 ENCOUNTER — Telehealth: Payer: Self-pay | Admitting: Neurology

## 2022-01-09 ENCOUNTER — Telehealth: Payer: Medicare HMO

## 2022-01-09 ENCOUNTER — Other Ambulatory Visit: Payer: Self-pay

## 2022-01-09 DIAGNOSIS — G3184 Mild cognitive impairment, so stated: Secondary | ICD-10-CM

## 2022-01-09 NOTE — Telephone Encounter (Signed)
Called Dawn and let he know we are going to schedule with [facility] imaging

## 2022-01-09 NOTE — Telephone Encounter (Signed)
Received emai from [facility] Clinical Team:  Good Morning,   I have a patient MRN:  [id number] / HAR:  [id number], [name], DOB [birth year] on the schedule for MR BRAIN [place] on 01/10/2022 and I am showing [name] is required but not on file per [facility]. If the authorization is not in place before 1:00PM the day prior to the procedure the patient may be canceled or rescheduled until authorization is in place. Thanks    [place] Pre-Service Specialist Direct Dial: [phone]  Fax: [phone]

## 2022-01-09 NOTE — Telephone Encounter (Signed)
I called central scheduleing and cancelled mri for husband, thank you

## 2022-01-09 NOTE — Telephone Encounter (Signed)
The following message was left with [facility] on 01/09/22 at :19 PM.  Caller states that she is calling from [facility]. She emailed the office regarding a patient's PA for an MRI of the brain.

## 2022-01-09 NOTE — Telephone Encounter (Signed)
Stated they appreciate all the help regarding this situation. If you can sch an appt in the afternoon cause Jing doesn't do well in the mornings

## 2022-01-09 NOTE — Telephone Encounter (Signed)
Please cancel the appt, we did not get this information in time to schedule, Patient is advised. Thank you

## 2022-01-10 ENCOUNTER — Ambulatory Visit (HOSPITAL_BASED_OUTPATIENT_CLINIC_OR_DEPARTMENT_OTHER): Payer: Medicare HMO

## 2022-01-16 ENCOUNTER — Telehealth: Payer: Self-pay | Admitting: Neurology

## 2022-01-16 NOTE — Telephone Encounter (Signed)
Patient advised that [facility] will be calling to get scheduled. They were able to get approval this am.

## 2022-01-16 NOTE — Telephone Encounter (Signed)
Checking with [facility] at this moment, will call patient back its 9:51am 01/16/2022

## 2022-01-16 NOTE — Telephone Encounter (Signed)
Patients husband called in, he wants to know if [name] is able to get the MRI done before her 8/29 appt with aquino. If she isnt able to, does she need to resch the appt with aquino ?

## 2022-01-19 ENCOUNTER — Other Ambulatory Visit: Payer: Self-pay | Admitting: Physician Assistant

## 2022-01-19 DIAGNOSIS — G3184 Mild cognitive impairment, so stated: Secondary | ICD-10-CM

## 2022-01-20 ENCOUNTER — Encounter: Payer: Self-pay | Admitting: Neurology

## 2022-01-20 ENCOUNTER — Ambulatory Visit: Payer: Medicare HMO | Admitting: Neurology

## 2022-01-20 DIAGNOSIS — F02A0 Dementia in other diseases classified elsewhere, mild, without behavioral disturbance, psychotic disturbance, mood disturbance, and anxiety: Secondary | ICD-10-CM | POA: Diagnosis not present

## 2022-01-20 DIAGNOSIS — G301 Alzheimer's disease with late onset: Secondary | ICD-10-CM | POA: Diagnosis not present

## 2022-01-20 NOTE — Patient Instructions (Signed)
Good to see you.  Continue Donepezil '10mg'$  daily  2. Continue Memantine '10mg'$  twice a day  3. If you are interested in the driving assessment, you can contact the following:  [employer] in [place]  [place]  [facility] [phone] or [phone]  4. Follow-up in 6 months, call for any changes   FALL PRECAUTIONS: Be cautious when walking. Scan the area for obstacles that may increase the risk of trips and falls. When getting up in the mornings, sit up at the edge of the bed for a few minutes before getting out of bed. Consider elevating the bed at the head end to avoid drop of blood pressure when getting up. Walk always in a well-lit room (use night lights in the walls). Avoid area rugs or power cords from appliances in the middle of the walkways. Use a walker or a cane if necessary and consider physical therapy for balance exercise. Get your eyesight checked regularly.  FINANCIAL OVERSIGHT: Supervision, especially oversight when making financial decisions or transactions is also recommended.  HOME SAFETY: Consider the safety of the kitchen when operating appliances like stoves, microwave oven, and blender. Consider having supervision and share cooking responsibilities until no longer able to participate in those. Accidents with firearms and other hazards in the house should be identified and addressed as well.  DRIVING: Regarding driving, in patients with progressive memory problems, driving will be impaired. We advise to have someone else do the driving if trouble finding directions or if minor accidents are reported. Independent driving assessment is available to determine safety of driving.  ABILITY TO BE LEFT ALONE: If patient is unable to contact 911 operator, consider using LifeLine, or when the need is there, arrange for someone to stay with patients. Smoking is a fire hazard,  consider supervision or cessation. Risk of wandering should be assessed by caregiver and if detected at any point, supervision and safe proof recommendations should be instituted.  MEDICATION SUPERVISION: Inability to self-administer medication needs to be constantly addressed. Implement a mechanism to ensure safe administration of the medications.  RECOMMENDATIONS FOR ALL PATIENTS WITH MEMORY PROBLEMS: 1. Continue to exercise (Recommend 30 minutes of walking everyday, or 3 hours every week) 2. Increase social interactions - continue going to [place] and enjoy social gatherings with friends and family 3. Eat healthy, avoid fried foods and eat more fruits and vegetables 4. Maintain adequate blood pressure, blood sugar, and blood cholesterol level. Reducing the risk of stroke and cardiovascular disease also helps promoting better memory. 5. Avoid stressful situations. Live a simple life and avoid aggravations. Organize your time and prepare for the next day in anticipation. 6. Sleep well, avoid any interruptions of sleep and avoid any distractions in the bedroom that may interfere with adequate sleep quality 7. Avoid sugar, avoid sweets as there is a strong link between excessive sugar intake, diabetes, and cognitive impairment We discussed the Mediterranean diet, which has been shown to help patients reduce the risk of progressive memory disorders and reduces cardiovascular risk. This includes eating fish, eat fruits and green leafy vegetables, nuts like almonds and hazelnuts, walnuts, and also use olive oil. Avoid fast foods and fried foods as much as possible. Avoid sweets and sugar as sugar use has been linked to worsening of memory function.  There is always a concern of gradual progression of memory problems. If this is the case, then we may need to adjust level of care according  to patient needs. Support, both to the patient and caregiver, should then be put into place.

## 2022-01-20 NOTE — Progress Notes (Signed)
NEUROLOGY FOLLOW UP OFFICE NOTE  [name] [id number] 10-21-1940  HISTORY OF [name] ILLNESS: I had the pleasure of seeing [name] in follow-up in the neurology clinic on 01/20/2022.  The patient was last seen 6 months ago for memory loss. She is again accompanied by her significant other [name] who helps supplement the history today. 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 Compared to prior testing, there was decline noted across encoding aspects of memory, cognitive flexibility, and semantic fluency. Consistent decline was also observed across attention/concentration. Etiology remains concerning for Alzheimer's disease. On her last visit, [name] reported worsening short-term memory. Memantine 30m BID was added to Donepezil 130mdaily. MRI brain is scheduled for 01/2022. She states she is losing her mind, with difficulty remembering things. Her reading has slowed down. She continues to live alone. She can drive to [place] and familiar locations, however he is concerned about her driving. She denies forgetting medications, Jessica Plates [name] to check behind. She denies missing bill payments, [name] her she forgot one month of payment and stopped her credit card 2 months ago. She states it was not a "forget thing" but more an "irritation thing." She is independent with dressing and bathing. Mood is not any different, [name] she is in a good mood. No paranoia or hallucinations. Sleep is good. She denies any headaches, dizziness, vision changes, focal numbness/tingling/weakness, no falls.    History on Initial Assessment 10/24/2018: This is a 81ear old right-handed woman with a history of hypothyroidism, ovarian cancer, breast cancer, presenting for evaluation memory loss. Her significant other [name] during the e-visit to provide additional information. She  had 2 syncopal episodes in one day last March 2020, and on follow-up with her PCP, memory issues were brought up. She has been taking Donepezil 1052mince 2018 previously prescribed by Dr. KwiBurnice Loganhe initially did not remember the syncopal episode in March, but on further questioning, she recalls she was at the check out aisle and did not feel good at that time and woke up on the ground, no tongue bite or incontinence. She apparently passed out twice in the store. She states that she was pretty sure it occurred because she was starving. Bloodwork, EKG, and head CT were unremarkable, no further similar spells since then. She feels her memory is "not good," but she states she is doing fine living alone. [name] a mile away and sees her everyday. He has known her for over 30 years and started notice short-term memory issues worse in the past year, for instance she did not remember the movie they watched last night. He states she has not gotten lost driving, and she has managed to stay on top of her bills and medications. She denies any difficulties as well. She denies leaving the stove on or faucet running. There is no family history of dementia, she denies any history of significant head injuries, she rarely drinks alcohol.  She denies any headaches, dizziness, vision changes, focal numbness/tingling/weakness, neck/back pain, bowel/bladder dysfunction, anosmia, or tremors. Sleep has always been a problem. No wandering behavior, paranoia, or hallucinations. [name] any staring/unresponsive episodes. She denies any olfactory/gustatory hallucinations, rising epigastric sensation, myoclonic jerks. [place].   I personally reviewed head CT without contrast done 07/2018 which did not show any acute changes, there was mild chronic microvascular disease,  mild diffuse atrophy.  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 1993; Adenocarcinoma   History of malignant neoplasm of ovary    1998 and 2004   Hypokalemia 01/17/2021   Hypothyroidism 05/11/2011   Peripheral neuropathy 09/06/2012   Syncopal episodes 08/04/2018   Vasomotor instability 09/16/2014   Vitamin D deficiency     MEDICATIONS: Current Outpatient Medications on File Prior to Visit  Medication Sig Dispense Refill   Aspirin-Acetaminophen-Caffeine (EXCEDRIN MIGRAINE PO) Take 1 tablet by mouth daily as needed (headache).     donepezil (ARICEPT) 10 MG tablet Take 1 tablet (10 mg total) by mouth at bedtime. 90 tablet 3   estradiol (ESTRACE) 1 MG tablet Take 1 tablet (1 mg total) by mouth daily. 30 tablet 1   levothyroxine (SYNTHROID) 88 MCG tablet Take 1 tablet (88 mcg total) by mouth at bedtime. 90 tablet 3   memantine (NAMENDA) 10 MG tablet TAKE 1 TABLET BY MOUTH EVERY NIGHT FOR 2WEEKS THEN INCREASE TO 1 TABLET TWICE A DAY 60 tablet 11   Multiple Vitamin (MULTIVITAMIN WITH MINERALS) TABS tablet Take 1 tablet by mouth at bedtime.     No current facility-administered medications on file prior to visit.    ALLERGIES: Allergies  Allergen Reactions   Codeine Sulfate Nausea And Vomiting    Projectile vomiting   Promethazine Hcl Other (See Comments)    Unknown reaction    FAMILY HISTORY: Family History  Problem Relation Age of Onset   Cancer Mother        ovarian; history of melanoma    Aneurysm Father        abdominal aortic   Heart disease Father    Diverticulitis Brother    Breast cancer Maternal Aunt     SOCIAL HISTORY: Social History   Socioeconomic History    Marital status: Single    Spouse name: Not on file   Number of children: 2   Years of education: 18   Highest education level: Master's degree (e.g., MA, MS, MEng, MEd, MSW, MBA)  Occupational History   Occupation: Retired  Tobacco Use   Smoking status: Former    Types: Cigarettes    Quit date: 05/26/1991    Years since quitting: 30.6   Smokeless tobacco: Never  Vaping Use   Vaping Use: Never used  Substance and Sexual Activity   Alcohol use: Yes    Comment: social   Drug use: No   Sexual activity: Not on file  Other Topics Concern   Not on file  Social History Narrative   March 1998- history of stage III ovarian cancer; status post suboptimal debulking; status post 6 cycles of Taxoil and carboplatin with complete remission documented by second look surgery September 1998.      Recurrent ovarian cancer, status post 6 cycles carbo and Taxol, April 1998 until July 2008      1992, left breast intraductal carcinoma (DCIS)   Status post left modified radial mastectomy and with silicon implant   13 of 13 lymph modes negative; positive for BCRA1 gene   Recurrent ovarian carinoma, February 2008   Admit  Feb. 2009 partial small bowel obstruction treated medically      Right handed    Social Determinants of Health   Financial Resource Strain: Low Risk  (09/23/2021)   Overall Financial Resource Strain (CARDIA)    Difficulty of Paying Living Expenses: Not hard at all  Food Insecurity: No Food Insecurity (09/23/2021)   Hunger Vital Sign    Worried About Running Out of Food in the Last Year: Never true    Ran Out of Food in the Last Year: Never true  Transportation Needs: No Transportation Needs (09/23/2021)   PRAPARE - Hydrologist (Medical): No    Lack of Transportation (Non-Medical): No  Physical Activity: Sufficiently Active (09/23/2021)   Exercise Vital Sign    Days of Exercise per Week: 5 days    Minutes of Exercise per Session: 30 min  Stress: No Stress  Concern [name] (09/23/2021)   [place]    Feeling of Stress : Not at all  Social Connections: Socially Isolated (09/23/2021)   Social Connection and Isolation Panel [NHANES]    Frequency of Communication with Friends and Family: More than three times a week    Frequency of Social Gatherings with Friends and Family: More than three times a week    Attends Religious Services: Never    Marine scientist or Organizations: No    Attends Archivist Meetings: Never    Marital Status: Divorced  Human resources officer Violence: Not At Risk (09/23/2021)   Humiliation, Afraid, Rape, and Kick questionnaire    Fear of Current or Ex-Partner: No    Emotionally Abused: No    Physically Abused: No    Sexually Abused: No     PHYSICAL EXAM:  General: No acute distress Head:  Normocephalic/atraumatic Skin/Extremities: No rash, no edema Neurological Exam: alert and oriented to person, place. No aphasia or dysarthria. Fund of knowledge is appropriate.  Recent and remote memory are impaired.  Attention and concentration are normal.  MMSE 20/30.    01/20/2022    3:00 PM  MMSE - Mini Mental State Exam  Orientation to time 1  Orientation to Place 3  Registration 3  Attention/ Calculation 5  Recall 0  Language- name 2 objects 2  Language- repeat 1  Language- follow 3 step command 3  Language- read & follow direction 1  Write a sentence 1  Copy design 0  Total score 20   Cranial nerves: Pupils equal, round. Extraocular movements intact with no nystagmus. Visual fields full.  No facial asymmetry.  Motor: Bulk and tone normal, muscle strength 5/5 throughout with no pronator drift.   Finger to nose testing intact.  Gait narrow-based and steady, no ataxia.  Romberg negative.   IMPRESSION: This is an 81 yo RH woman with a history of hypothyroidism, breast cancer, ovarian cancer, memory loss, likely due to Alzheimer's disease.  Neurocognitive testing in December 2022 provided a diagnosis of MCI, mainly due to report of managing complex tasks fine. MMSE today 20/30. Proceed with brain MRI scheduled. I am concerned about progression to mild dementia, continue Donepezil 76m daily and Memantine 173mBID. Discussed doing a formal driving evaluation if driving is a concern. Continue to monitor driving and medication management. We again discussed the importance of control of vascular risk factors, physical exercise, brain stimulation activities, and MIND diet for overall brain health. Follow-up in 6 months, call for any changes.     Thank  you for allowing me to participate in her care.  Please do not hesitate to call for any questions or concerns.    [name], M.D.   CC: Dr. Ethelene Hal

## 2022-01-27 ENCOUNTER — Telehealth: Payer: Self-pay | Admitting: Neurology

## 2022-01-27 NOTE — Telephone Encounter (Signed)
Patient wants speak to someone about the MRI and she is to have it done on Saturday. She is worried about the contrast, she know several people that have had problems with it and are still recovering from it. Please call

## 2022-01-28 NOTE — Telephone Encounter (Signed)
Pls let them know her MRI brain is ordered without contrast. She will not be getting contrast. thanks

## 2022-01-28 NOTE — Telephone Encounter (Signed)
Patient is wanting to know why we are doing the MRI and what we are looking for  please call

## 2022-01-29 ENCOUNTER — Encounter: Payer: Self-pay | Admitting: Family Medicine

## 2022-01-29 ENCOUNTER — Ambulatory Visit (INDEPENDENT_AMBULATORY_CARE_PROVIDER_SITE_OTHER): Payer: Medicare HMO | Admitting: Family Medicine

## 2022-01-29 VITALS — BP 108/64 | HR 72 | Temp 97.4°F | Ht 68.0 in | Wt 146.4 lb

## 2022-01-29 DIAGNOSIS — R413 Other amnesia: Secondary | ICD-10-CM

## 2022-01-29 DIAGNOSIS — R7309 Other abnormal glucose: Secondary | ICD-10-CM | POA: Diagnosis not present

## 2022-01-29 DIAGNOSIS — E785 Hyperlipidemia, unspecified: Secondary | ICD-10-CM | POA: Diagnosis not present

## 2022-01-29 DIAGNOSIS — E039 Hypothyroidism, unspecified: Secondary | ICD-10-CM

## 2022-01-29 LAB — CBC
HCT: 46 % (ref 36.0–46.0)
Hemoglobin: 15.4 g/dL — ABNORMAL HIGH (ref 12.0–15.0)
MCHC: 33.6 g/dL (ref 30.0–36.0)
MCV: 92.7 fl (ref 78.0–100.0)
Platelets: 211 10*3/uL (ref 150.0–400.0)
RBC: 4.96 Mil/uL (ref 3.87–5.11)
RDW: 12.7 % (ref 11.5–15.5)
WBC: 5.1 10*3/uL (ref 4.0–10.5)

## 2022-01-29 LAB — TSH: TSH: 0.16 u[IU]/mL — ABNORMAL LOW (ref 0.35–5.50)

## 2022-01-29 LAB — BASIC METABOLIC PANEL
BUN: 15 mg/dL (ref 6–23)
CO2: 26 mEq/L (ref 19–32)
Calcium: 9.3 mg/dL (ref 8.4–10.5)
Chloride: 106 mEq/L (ref 96–112)
Creatinine, Ser: 0.85 mg/dL (ref 0.40–1.20)
GFR: 63.78 mL/min (ref >60.00)
Glucose, Bld: 60 mg/dL — ABNORMAL LOW (ref 70–99)
Potassium: 3.6 mEq/L (ref 3.5–5.1)
Sodium: 139 mEq/L (ref 135–145)

## 2022-01-29 LAB — LDL CHOLESTEROL, DIRECT: Direct LDL: 154 mg/dL

## 2022-01-29 MED ORDER — LEVOTHYROXINE SODIUM 88 MCG PO TABS: 88.0000 ug | ORAL_TABLET | Freq: Every day | ORAL | 3 refills | Status: DC

## 2022-01-29 MED ORDER — ATORVASTATIN CALCIUM 10 MG PO TABS: 10.0000 mg | ORAL_TABLET | Freq: Every day | ORAL | 3 refills | Status: DC

## 2022-01-29 NOTE — Progress Notes (Signed)
Established Patient Office Visit  Subjective   Patient ID: [name], female    DOB: 1940/10/05  Age: 81 y.o. MRN: [id number]  Chief Complaint  Patient presents with   Follow-up    6 month follow up, memory loss medication not working.     HPI follow-up of hypothyroidism elevated ldl cholesterol and elevated glucose. Memory issues persist mostly with short term memory. Continues to drive with out becoming lost and is managing the household with her husband's help.     Review of Systems  Constitutional: Negative.   HENT: Negative.    Eyes:  Negative for blurred vision, discharge and redness.  Respiratory: Negative.    Cardiovascular: Negative.   Gastrointestinal:  Negative for abdominal pain.  Genitourinary: Negative.   Musculoskeletal: Negative.  Negative for myalgias.  Skin:  Negative for rash.  Neurological:  Negative for tingling, loss of consciousness and weakness.  Endo/Heme/Allergies:  Negative for polydipsia.  Psychiatric/Behavioral:  Positive for memory loss. Negative for depression. The patient is not nervous/anxious.       Objective:     BP 108/64 (BP Location: Right Arm, Patient Position: Sitting, Cuff Size: Normal)   Pulse 72   Temp (!) 97.4 F (36.3 C) (Temporal)   Ht '5\' 8"'$  (1.727 m)   Wt 146 lb 6.4 oz (66.4 kg)   SpO2 97%   BMI 22.26 kg/m    Physical Exam Constitutional:      General: She is not in acute distress.    Appearance: Normal appearance. She is not ill-appearing, toxic-appearing or diaphoretic.  HENT:     Head: Normocephalic and atraumatic.     Right Ear: External ear normal.     Left Ear: External ear normal.     Mouth/Throat:     Mouth: Mucous membranes are moist.     Pharynx: Oropharynx is clear. No oropharyngeal exudate or posterior oropharyngeal erythema.  Eyes:     General: No scleral icterus.       Right eye: No discharge.        Left eye: No discharge.     Extraocular Movements: Extraocular movements intact.      Conjunctiva/sclera: Conjunctivae normal.     Pupils: Pupils are equal, round, and reactive to light.  Neck:     Vascular: No carotid bruit.  Cardiovascular:     Rate and Rhythm: Normal rate and regular rhythm.  Pulmonary:     Effort: Pulmonary effort is normal. No respiratory distress.     Breath sounds: Normal breath sounds.  Abdominal:     General: Bowel sounds are normal.  Musculoskeletal:     Cervical back: No rigidity or tenderness.  Lymphadenopathy:     Cervical: No cervical adenopathy.  Skin:    General: Skin is warm and dry.  Neurological:     Mental Status: She is alert and oriented to person, place, and time.     Comments: Oriented to person place and knew that it was afternoon.  She did not know what year this is.  Psychiatric:        Mood and Affect: Mood normal.        Behavior: Behavior normal.      Results for orders placed or performed in visit on 01/29/22  LDL cholesterol, direct  Result Value Ref Range   Direct LDL 154.0 mg/dL  CBC  Result Value Ref Range   WBC 5.1 4.0 - 10.5 K/uL   RBC 4.96 3.87 - 5.11 Mil/uL   Platelets  211.0 150.0 - 400.0 K/uL   Hemoglobin 15.4 (H) 12.0 - 15.0 g/dL   HCT 46.0 36.0 - 46.0 %   MCV 92.7 78.0 - 100.0 fl   MCHC 33.6 30.0 - 36.0 g/dL   RDW 12.7 11.5 - 03.4 %  Basic metabolic panel  Result Value Ref Range   Sodium 139 135 - 145 mEq/L   Potassium 3.6 3.5 - 5.1 mEq/L   Chloride 106 96 - 112 mEq/L   CO2 26 19 - 32 mEq/L   Glucose, Bld 60 (L) 70 - 99 mg/dL   BUN 15 6 - 23 mg/dL   Creatinine, Ser 0.85 0.40 - 1.20 mg/dL   GFR 63.78 >60.00 mL/min   Calcium 9.3 8.4 - 10.5 mg/dL  TSH  Result Value Ref Range   TSH 0.16 (L) 0.35 - 5.50 uIU/mL      The ASCVD Risk score (Arnett DK, et al., 2019) failed to calculate for the following reasons:   The 2019 ASCVD risk score is only valid for ages 68 to 93    Assessment & Plan:   Problem List Items Addressed This Visit       Endocrine   Hypothyroidism   Relevant  Medications   levothyroxine (SYNTHROID) 50 MCG tablet   Other Relevant Orders   TSH (Completed)     Other   Elevated glucose   Relevant Orders   Basic metabolic panel (Completed)   Urinalysis, Routine w reflex microscopic   Dyslipidemia   Relevant Medications   atorvastatin (LIPITOR) 10 MG tablet   Other Relevant Orders   LDL cholesterol, direct (Completed)   Memory impairment - Primary   Relevant Orders   CBC (Completed)    Return in about 3 months (around 04/30/2022).  Discussed using a low-dose statin to mitigate further memory loss is there is new data suggesting it then may be beneficial.  Scheduled for MRI of brain and follow-up with neurology in the near future.  [name], MD  TSH remains suppressed.  I have ordered a lower dose levothyroxine at 50 mcg. Schedule follow-up in 3 months.

## 2022-01-29 NOTE — Telephone Encounter (Signed)
Called and explained that the MRI is without contrast and the reasoning behind the MRI is to be able to see any changes in the brain or brain matter

## 2022-01-31 ENCOUNTER — Ambulatory Visit
Admission: RE | Admit: 2022-01-31 | Discharge: 2022-01-31 | Disposition: A | Discharge status: Home / Self Care | Payer: Medicare HMO | Source: Ambulatory Visit | Attending: Physician Assistant | Admitting: Physician Assistant

## 2022-01-31 DIAGNOSIS — G3184 Mild cognitive impairment, so stated: Secondary | ICD-10-CM

## 2022-01-31 DIAGNOSIS — I639 Cerebral infarction, unspecified: Secondary | ICD-10-CM | POA: Diagnosis not present

## 2022-01-31 DIAGNOSIS — I739 Peripheral vascular disease, unspecified: Secondary | ICD-10-CM | POA: Diagnosis not present

## 2022-01-31 DIAGNOSIS — F0390 Unspecified dementia without behavioral disturbance: Secondary | ICD-10-CM | POA: Diagnosis not present

## 2022-02-02 MED ORDER — LEVOTHYROXINE SODIUM 50 MCG PO TABS: 50.0000 ug | ORAL_TABLET | Freq: Every day | ORAL | 0 refills | Status: DC

## 2022-02-02 NOTE — Addendum Note (Signed)
Addended by: Abelino Derrick A on: 02/02/2022 01:06 PM   Modules accepted: Orders

## 2022-02-05 ENCOUNTER — Telehealth: Payer: Self-pay | Admitting: Neurology

## 2022-02-05 NOTE — Telephone Encounter (Signed)
Spoke with [name] who is on the [facility] informed him that There was nothing new/acute on the MRI.  Brain has shrunk with age and there is hardening of arteries in brain, not unexpected for age/medical problems

## 2022-02-05 NOTE — Telephone Encounter (Signed)
[name], patient significant other, called to see if MrI results are ready from last Saturday.

## 2022-03-11 ENCOUNTER — Ambulatory Visit
Admission: RE | Admit: 2022-03-11 | Discharge: 2022-03-11 | Disposition: A | Discharge status: Home / Self Care | Payer: Medicare HMO | Source: Ambulatory Visit | Attending: Family Medicine | Admitting: Family Medicine

## 2022-03-11 DIAGNOSIS — M8589 Other specified disorders of bone density and structure, multiple sites: Secondary | ICD-10-CM | POA: Diagnosis not present

## 2022-03-11 DIAGNOSIS — Z13820 Encounter for screening for osteoporosis: Secondary | ICD-10-CM

## 2022-03-11 DIAGNOSIS — Z780 Asymptomatic menopausal state: Secondary | ICD-10-CM | POA: Diagnosis not present

## 2022-05-26 DIAGNOSIS — E2839 Other primary ovarian failure: Secondary | ICD-10-CM | POA: Diagnosis not present

## 2022-05-26 DIAGNOSIS — Z853 Personal history of malignant neoplasm of breast: Secondary | ICD-10-CM | POA: Diagnosis not present

## 2022-05-26 DIAGNOSIS — Z01419 Encounter for gynecological examination (general) (routine) without abnormal findings: Secondary | ICD-10-CM | POA: Diagnosis not present

## 2022-05-26 DIAGNOSIS — Z1501 Genetic susceptibility to malignant neoplasm of breast: Secondary | ICD-10-CM | POA: Diagnosis not present

## 2022-05-26 DIAGNOSIS — Z8543 Personal history of malignant neoplasm of ovary: Secondary | ICD-10-CM | POA: Diagnosis not present

## 2022-07-30 ENCOUNTER — Telehealth: Payer: Self-pay | Admitting: Family Medicine

## 2022-07-30 ENCOUNTER — Ambulatory Visit: Payer: Medicare HMO | Admitting: Family Medicine

## 2022-07-30 NOTE — Telephone Encounter (Signed)
3.7.24 no show letter sent

## 2022-08-04 NOTE — Telephone Encounter (Signed)
1st no show, fee waived, letter sent to reschedule/notify of policy 

## 2022-08-12 ENCOUNTER — Ambulatory Visit: Payer: Medicare HMO | Admitting: Neurology

## 2022-08-12 ENCOUNTER — Encounter: Payer: Self-pay | Admitting: Neurology

## 2022-08-12 VITALS — BP 99/69 | HR 86 | Ht 68.0 in | Wt 154.0 lb

## 2022-08-12 DIAGNOSIS — G301 Alzheimer's disease with late onset: Secondary | ICD-10-CM | POA: Diagnosis not present

## 2022-08-12 DIAGNOSIS — F02A0 Dementia in other diseases classified elsewhere, mild, without behavioral disturbance, psychotic disturbance, mood disturbance, and anxiety: Secondary | ICD-10-CM

## 2022-08-12 MED ORDER — DONEPEZIL HCL 10 MG PO TABS: 10.0000 mg | ORAL_TABLET | Freq: Every day | ORAL | 3 refills | Status: DC

## 2022-08-12 MED ORDER — MEMANTINE HCL 10 MG PO TABS: ORAL_TABLET | ORAL | 3 refills | Status: DC

## 2022-08-12 NOTE — Patient Instructions (Addendum)
Good to see you.   Schedule repeat Neurocognitive testing  2. Continue Donepezil 10mg  daily and Memantine 10mg  twice a day  3. Follow-up in 6 months, call for any changes   You have been referred for a neurocognitive evaluation in our office.   The evaluation has two parts.   The first part of the evaluation is a clinical interview with the neuropsychologist. Please bring someone with you to this appointment if possible, as it is helpful for the doctor to hear from both you and another adult who knows you well.   The second part of the evaluation is testing with the doctor's technician Hinton Dyer or Maudie Mercury). The testing includes a variety of tasks- mostly question-and-answer, some paper-and-pencil. There is nothing you need to do to prepare for this appointment, but having a good night's sleep prior to the testing, taking medications as you normally would, and bringing eyeglasses and hearing aids (if you wear them), is advised. Please make sure that you wear a mask to the appointment.  Please note: We have to reserve several hours of the neuropsychologist's time and the psychometrician's time for your evaluation appointment. As such, please note that there is a No-Show fee of $100. If you are unable to attend any of your appointments, please contact our office as soon as possible to reschedule.    FALL PRECAUTIONS: Be cautious when walking. Scan the area for obstacles that may increase the risk of trips and falls. When getting up in the mornings, sit up at the edge of the bed for a few minutes before getting out of bed. Consider elevating the bed at the head end to avoid drop of blood pressure when getting up. Walk always in a well-lit room (use night lights in the walls). Avoid area rugs or power cords from appliances in the middle of the walkways. Use a walker or a cane if necessary and consider physical therapy for balance exercise. Get your eyesight checked regularly.  FINANCIAL OVERSIGHT:  Supervision, especially oversight when making financial decisions or transactions is also recommended as difficulties arise.  HOME SAFETY: Consider the safety of the kitchen when operating appliances like stoves, microwave oven, and blender. Consider having supervision and share cooking responsibilities until no longer able to participate in those. Accidents with firearms and other hazards in the house should be identified and addressed as well.  DRIVING: Regarding driving, in patients with progressive memory problems, driving will be impaired. We advise to have someone else do the driving if trouble finding directions or if minor accidents are reported. Independent driving assessment is available to determine safety of driving.  ABILITY TO BE LEFT ALONE: If patient is unable to contact 911 operator, consider using LifeLine, or when the need is there, arrange for someone to stay with patients. Smoking is a fire hazard, consider supervision or cessation. Risk of wandering should be assessed by caregiver and if detected at any point, supervision and safe proof recommendations should be instituted.  MEDICATION SUPERVISION: Inability to self-administer medication needs to be constantly addressed. Implement a mechanism to ensure safe administration of the medications.  RECOMMENDATIONS FOR ALL PATIENTS WITH MEMORY PROBLEMS: 1. Continue to exercise (Recommend 30 minutes of walking everyday, or 3 hours every week) 2. Increase social interactions - continue going to [place] and enjoy social gatherings with friends and family 3. Eat healthy, avoid fried foods and eat more fruits and vegetables 4. Maintain adequate blood pressure, blood sugar, and blood cholesterol level. Reducing the risk of stroke and  cardiovascular disease also helps promoting better memory. 5. Avoid stressful situations. Live a simple life and avoid aggravations. Organize your time and prepare for the next day in anticipation. 6. Sleep well,  avoid any interruptions of sleep and avoid any distractions in the bedroom that may interfere with adequate sleep quality 7. Avoid sugar, avoid sweets as there is a strong link between excessive sugar intake, diabetes, and cognitive impairment The Mediterranean diet has been shown to help patients reduce the risk of progressive memory disorders and reduces cardiovascular risk. This includes eating fish, eat fruits and green leafy vegetables, nuts like almonds and hazelnuts, walnuts, and also use olive oil. Avoid fast foods and fried foods as much as possible. Avoid sweets and sugar as sugar use has been linked to worsening of memory function.  There is always a concern of gradual progression of memory problems. If this is the case, then we may need to adjust level of care according to patient needs. Support, both to the patient and caregiver, should then be put into place.

## 2022-08-12 NOTE — Progress Notes (Signed)
NEUROLOGY FOLLOW UP OFFICE NOTE  [name] FE:[id number] 02-29-1941  HISTORY OF PRESENT ILLNESS: I had the pleasure of seeing [name], an 82 year old in follow-up in the neurology clinic on 08/12/2022, at age 82.  The patient was last seen 7 months ago for memory loss. She is again accompanied by her significant other [name] who helps supplement the history today.  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 Compared to prior testing, there was decline noted across encoding aspects of memory, cognitive flexibility, and semantic fluency. Consistent decline was also observed across attention/concentration. Etiology remains concerning for Alzheimer's disease. Her MMSE in 12/2021 was 20/30. She continues to deny any difficulties with complex tasks but states "I'm losing my mind." She denies getting lost driving, she mostly drives to [employer] and the grocery. She denies missing medications or bill payments. [name] reminds her she forgets to pay, she paid [employer] twice. She notes most are on autopay. [name] does the cooking. Sometimes she is not seeing something she is looking for right in front of her. [name] notes she is on the "border of being a hoarder." [name] notes sometimes her memory is really bad. No personality changes, [name] notes she is a bit ornery. No hallucinations or paranoia. She reports mood and sleep are good. [name] reports sometimes she gets up at 2-3pm. She does not get that much exercise. They are working on getting her daughter to be POA. She is on Donepezil 10mg  daily and Memantine 10mg  BID without side effects.   I personally reviewed brain MRI without contrast done 01/2022: no acute changes. 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    History on Initial Assessment 10/24/2018: This is an 82 year old right-handed woman with a history of  hypothyroidism, ovarian cancer, breast cancer, presenting for evaluation memory loss. Her significant other [name] is present during the e-visit to provide additional information. She had 2 syncopal episodes in one day last March 2020, and on follow-up with her PCP, memory issues were brought up. She has been taking Donepezil 10mg  since 2018 previously prescribed by Dr. Burnice Logan. She initially did not remember the syncopal episode in March, but on further questioning, she recalls she was at the check out aisle and did not feel good at that time and woke up on the ground, no tongue bite or incontinence. She apparently passed out twice in the store. She states that she was pretty sure it occurred because she was starving. Bloodwork, EKG, and head CT were unremarkable, no further similar spells since then. She feels her memory is "not good," but she states she is doing fine living alone. [name] lives a mile away and sees her everyday. He has known her for over 30 years and started notice short-term memory issues worse in the past year, for instance she did not remember the movie they watched last night. He states she has not gotten lost driving, and she has managed to stay on top of her bills and medications. She denies any difficulties as well. She denies leaving the stove on or faucet running. There is no family history of dementia, she denies any history of significant head injuries, she rarely drinks alcohol.  She denies any headaches, dizziness, vision changes, focal numbness/tingling/weakness, neck/back pain, bowel/bladder dysfunction, anosmia, or tremors. Sleep has always been a problem. No wandering behavior, paranoia, or hallucinations. [name] denies any staring/unresponsive episodes. She denies any olfactory/gustatory hallucinations,  rising epigastric sensation, myoclonic jerks. She has a daughter living in [place] who she sees once a week.   I personally reviewed head CT without contrast done 07/2018  which did not show any acute changes, there was mild chronic microvascular disease, mild diffuse atrophy.  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 1993; Adenocarcinoma   History of malignant neoplasm of ovary    1998 and 2004   Hypokalemia 01/17/2021   Hypothyroidism 05/11/2011   Peripheral neuropathy 09/06/2012   Syncopal episodes 08/04/2018   Vasomotor instability 09/16/2014   Vitamin D deficiency     MEDICATIONS: Current Outpatient Medications on File Prior to Visit  Medication Sig Dispense Refill   Aspirin-Acetaminophen-Caffeine (EXCEDRIN MIGRAINE PO) Take 1 tablet by mouth daily as needed (headache).     atorvastatin (LIPITOR) 10 MG tablet Take 1 tablet (10 mg total) by mouth daily. 90 tablet 3   donepezil (ARICEPT) 10 MG tablet Take 1 tablet (10 mg total) by mouth at bedtime. 90 tablet 3   estradiol (ESTRACE) 1 MG tablet Take 1 tablet (1 mg total) by mouth daily. 30 tablet 1   levothyroxine (SYNTHROID) 50 MCG tablet Take 1 tablet (50 mcg total) by mouth daily. 90 tablet 0   memantine (NAMENDA) 10 MG tablet TAKE 1 TABLET BY MOUTH EVERY NIGHT FOR 2WEEKS THEN INCREASE TO 1 TABLET TWICE A DAY 60 tablet 11   Multiple Vitamin (MULTIVITAMIN WITH MINERALS) TABS tablet Take 1 tablet by mouth at bedtime.     No current facility-administered medications on file prior to visit.    ALLERGIES: Allergies  Allergen Reactions   Codeine Sulfate Nausea And Vomiting    Projectile vomiting   Promethazine Hcl Other (See Comments)    Unknown reaction    FAMILY HISTORY: Family History  Problem  Relation Age of Onset   Cancer Mother        ovarian; history of melanoma    Aneurysm Father        abdominal aortic   Heart disease Father    Diverticulitis Brother    Breast cancer Maternal Aunt     SOCIAL HISTORY: Social History   Socioeconomic History   Marital status: Single    Spouse name: Not on file   Number of children: 2   Years of education: 18   Highest education level: Master's degree (e.g., MA, MS, MEng, MEd, MSW, MBA)  Occupational History   Occupation: Retired  Tobacco Use   Smoking status: Former    Types: Cigarettes    Quit date: 05/26/1991    Years since quitting: 31.2   Smokeless tobacco: Never  Vaping Use   Vaping Use: Never used  Substance and Sexual Activity   Alcohol use: Yes    Comment: social   Drug use: No   Sexual activity: Not on file  Other Topics Concern   Not on file  Social History Narrative   March 1998- history of stage III ovarian cancer; status post suboptimal debulking; status post 6 cycles of Taxoil and carboplatin with complete remission documented by second look surgery September 1998.  Recurrent ovarian cancer, status post 6 cycles carbo and Taxol, April 1998 until July 2008      1992, left breast intraductal carcinoma (DCIS)   Status post left modified radial mastectomy and with silicon implant   13 of 13 lymph modes negative; positive for BCRA1 gene   Recurrent ovarian carinoma, February 2008   Admit Feb. 2009 partial small bowel obstruction treated medically      Right handed    Social Determinants of Health   Financial Resource Strain: Low Risk  (09/23/2021)   Overall Financial Resource Strain (CARDIA)    Difficulty of Paying Living Expenses: Not hard at all  Food Insecurity: No Food Insecurity (09/23/2021)   Hunger Vital Sign    Worried About Running Out of Food in the Last Year: Never true    Ran Out of Food in the Last Year: Never true  Transportation Needs: No Transportation Needs (09/23/2021)   PRAPARE -  Hydrologist (Medical): No    Lack of Transportation (Non-Medical): No  Physical Activity: Sufficiently Active (09/23/2021)   Exercise Vital Sign    Days of Exercise per Week: 5 days    Minutes of Exercise per Session: 30 min  Stress: No Stress Concern Present (09/23/2021)   [place]    Feeling of Stress : Not at all  Social Connections: Socially Isolated (09/23/2021)   Social Connection and Isolation Panel [NHANES]    Frequency of Communication with Friends and Family: More than three times a week    Frequency of Social Gatherings with Friends and Family: More than three times a week    Attends Religious Services: Never    Marine scientist or Organizations: No    Attends Archivist Meetings: Never    Marital Status: Divorced  Human resources officer Violence: Not At Risk (09/23/2021)   Humiliation, Afraid, Rape, and Kick questionnaire    Fear of Current or Ex-Partner: No    Emotionally Abused: No    Physically Abused: No    Sexually Abused: No     PHYSICAL EXAM: Vitals:   08/12/22 1501  BP: 99/69  Pulse: 86  SpO2: 96%   General: No acute distress Head:  Normocephalic/atraumatic Skin/Extremities: No rash, no edema Neurological Exam: alert and oriented to person, place. Did not know month/date/year, does not recall when her birthday is (which is tomorrow). No aphasia or dysarthria. Fund of knowledge is appropriate.  Recent and remote memory are impaired, 1/3 delayed recall.  Attention and concentration are normal.   Cranial nerves: Pupils equal, round. Extraocular movements intact with no nystagmus. Visual fields full.  No facial asymmetry.  Motor: Bulk and tone normal, muscle strength 5/5 throughout with no pronator drift.   Finger to nose testing intact.  Gait narrow-based and steady, difficulty with tandem walk. Romberg negative.   IMPRESSION: This is an 82 yo RH woman with a  history of hypothyroidism, breast cancer, ovarian cancer, memory loss, likely due to Alzheimer's disease. Neurocognitive testing in December 2022 provided a diagnosis of MCI, mainly due to report of managing complex tasks fine. MMSE 20/30 in August 2023. She continues to deny any difficulties with complex tasks, however I continue to be concerned about progression to mild dementia. Continue Donepezil 10mg  daily and Memantine 10mg  BID. Continue close supervision and monitor driving. Discussed control of vascular risk factors, increasing physical exercise. Repeat Neuropsychological testing will be ordered to help guide future management (she currently  lives alone and denies any difficulties). Follow-up in 6 months, call for any changes.   Thank you for allowing me to participate in her care.  Please do not hesitate to call for any questions or concerns.   [name], M.D.   CC: Dr. Ethelene Hal

## 2022-09-28 ENCOUNTER — Ambulatory Visit (INDEPENDENT_AMBULATORY_CARE_PROVIDER_SITE_OTHER): Payer: Medicare HMO

## 2022-09-28 VITALS — Ht 68.0 in | Wt 140.0 lb

## 2022-09-28 DIAGNOSIS — Z0000 Encounter for general adult medical examination without abnormal findings: Secondary | ICD-10-CM | POA: Diagnosis not present

## 2022-09-28 NOTE — Patient Instructions (Signed)
Ms. [name] , Thank you for taking time to come for your Medicare Wellness Visit. I appreciate your ongoing commitment to your health goals. Please review the following plan we discussed and let me know if I can assist you in the future.   These are the goals we discussed:  Goals       Increase physical activity (pt-stated)      Walking more      Patient Stated      Would like to start walking       Patient Stated      09/28/2022, no goals        This is a list of the screening recommended for you and due dates:  Health Maintenance  Topic Date Due   Mammogram  03/26/2022   DTaP/Tdap/Td vaccine (2 - Td or Tdap) 06/27/2022   Medicare Annual Wellness Visit  09/28/2023   DEXA scan (bone density measurement)  Completed   Zoster (Shingles) Vaccine  Completed   HPV Vaccine  Aged Out   Pneumonia Vaccine  Discontinued   Flu Shot  Discontinued   COVID-19 Vaccine  Discontinued    Advanced directives: Advance directive discussed with you today.   Conditions/risks identified: none  Next appointment: Follow up in one year for your annual wellness visit    Preventive Care 65 Years and Older, Female Preventive care refers to lifestyle choices and visits with your health care provider that can promote health and wellness. What does preventive care include? A yearly physical exam. This is also called an annual well check. Dental exams once or twice a year. Routine eye exams. Ask your health care provider how often you should have your eyes checked. Personal lifestyle choices, including: Daily care of your teeth and gums. Regular physical activity. Eating a healthy diet. Avoiding tobacco and drug use. Limiting alcohol use. Practicing safe sex. Taking low-dose aspirin every day. Taking vitamin and mineral supplements as recommended by your health care provider. What happens during an annual well check? The services and screenings done by your health care provider during your  annual well check will depend on your age, overall health, lifestyle risk factors, and family history of disease. Counseling  Your health care provider may ask you questions about your: Alcohol use. Tobacco use. Drug use. Emotional well-being. Home and relationship well-being. Sexual activity. Eating habits. History of falls. Memory and ability to understand (cognition). Work and work Astronomer. Reproductive health. Screening  You may have the following tests or measurements: Height, weight, and BMI. Blood pressure. Lipid and cholesterol levels. These may be checked every 5 years, or more frequently if you are over 74 years old. Skin check. Lung cancer screening. You may have this screening every year starting at age 61 if you have a 30-pack-year history of smoking and currently smoke or have quit within the past 15 years. Fecal occult blood test (FOBT) of the stool. You may have this test every year starting at age 63. Flexible sigmoidoscopy or colonoscopy. You may have a sigmoidoscopy every 5 years or a colonoscopy every 10 years starting at age 5. Hepatitis C blood test. Hepatitis B blood test. Sexually transmitted disease (STD) testing. Diabetes screening. This is done by checking your blood sugar (glucose) after you have not eaten for a while (fasting). You may have this done every 1-3 years. Bone density scan. This is done to screen for osteoporosis. You may have this done starting at age 6. Mammogram. This may be done every 1-2 years.  Talk to your health care provider about how often you should have regular mammograms. Talk with your health care provider about your test results, treatment options, and if necessary, the need for more tests. Vaccines  Your health care provider may recommend certain vaccines, such as: Influenza vaccine. This is recommended every year. Tetanus, diphtheria, and acellular pertussis (Tdap, Td) vaccine. You may need a Td booster every 10  years. Zoster vaccine. You may need this after age 30. Pneumococcal 13-valent conjugate (PCV13) vaccine. One dose is recommended after age 67. Pneumococcal polysaccharide (PPSV23) vaccine. One dose is recommended after age 63. Talk to your health care provider about which screenings and vaccines you need and how often you need them. This information is not intended to replace advice given to you by your health care provider. Make sure you discuss any questions you have with your health care provider. Document Released: 06/07/2015 Document Revised: 01/29/2016 Document Reviewed: 03/12/2015 Elsevier Interactive Patient Education  2017 [employer].  Fall Prevention in the Home Falls can cause injuries. They can happen to people of all ages. There are many things you can do to make your home safe and to help prevent falls. What can I do on the outside of my home? Regularly fix the edges of walkways and driveways and fix any cracks. Remove anything that might make you trip as you walk through a door, such as a raised step or threshold. Trim any bushes or trees on the path to your home. Use bright outdoor lighting. Clear any walking paths of anything that might make someone trip, such as rocks or tools. Regularly check to see if handrails are loose or broken. Make sure that both sides of any steps have handrails. Any raised decks and porches should have guardrails on the edges. Have any leaves, snow, or ice cleared regularly. Use sand or salt on walking paths during winter. Clean up any spills in your garage right away. This includes oil or grease spills. What can I do in the bathroom? Use night lights. Install grab bars by the toilet and in the tub and shower. Do not use towel bars as grab bars. Use non-skid mats or decals in the tub or shower. If you need to sit down in the shower, use a plastic, non-slip stool. Keep the floor dry. Clean up any water that spills on the floor as soon as it  happens. Remove soap buildup in the tub or shower regularly. Attach bath mats securely with double-sided non-slip rug tape. Do not have throw rugs and other things on the floor that can make you trip. What can I do in the bedroom? Use night lights. Make sure that you have a light by your bed that is easy to reach. Do not use any sheets or blankets that are too big for your bed. They should not hang down onto the floor. Have a firm chair that has side arms. You can use this for support while you get dressed. Do not have throw rugs and other things on the floor that can make you trip. What can I do in the kitchen? Clean up any spills right away. Avoid walking on wet floors. Keep items that you use a lot in easy-to-reach places. If you need to reach something above you, use a strong step stool that has a grab bar. Keep electrical cords out of the way. Do not use floor polish or wax that makes floors slippery. If you must use wax, use non-skid floor wax.  Do not have throw rugs and other things on the floor that can make you trip. What can I do with my stairs? Do not leave any items on the stairs. Make sure that there are handrails on both sides of the stairs and use them. Fix handrails that are broken or loose. Make sure that handrails are as long as the stairways. Check any carpeting to make sure that it is firmly attached to the stairs. Fix any carpet that is loose or worn. Avoid having throw rugs at the top or bottom of the stairs. If you do have throw rugs, attach them to the floor with carpet tape. Make sure that you have a light switch at the top of the stairs and the bottom of the stairs. If you do not have them, ask someone to add them for you. What else can I do to help prevent falls? Wear shoes that: Do not have high heels. Have rubber bottoms. Are comfortable and fit you well. Are closed at the toe. Do not wear sandals. If you use a stepladder: Make sure that it is fully opened.  Do not climb a closed stepladder. Make sure that both sides of the stepladder are locked into place. Ask someone to hold it for you, if possible. Clearly mark and make sure that you can see: Any grab bars or handrails. First and last steps. Where the edge of each step is. Use tools that help you move around (mobility aids) if they are needed. These include: Canes. Walkers. Scooters. Crutches. Turn on the lights when you go into a dark area. Replace any light bulbs as soon as they burn out. Set up your furniture so you have a clear path. Avoid moving your furniture around. If any of your floors are uneven, fix them. If there are any pets around you, be aware of where they are. Review your medicines with your doctor. Some medicines can make you feel dizzy. This can increase your chance of falling. Ask your doctor what other things that you can do to help prevent falls. This information is not intended to replace advice given to you by your health care provider. Make sure you discuss any questions you have with your health care provider. Document Released: 03/07/2009 Document Revised: 10/17/2015 Document Reviewed: 06/15/2014 Elsevier Interactive Patient Education  2017 [employer].

## 2022-09-28 NOTE — Progress Notes (Signed)
I connected with  Shirlee Limerick on 09/28/22 by a audio enabled telemedicine application and verified that I am speaking with the correct person using two identifiers.  Patient Location: Home  Provider Location: Office/Clinic  I discussed the limitations of evaluation and management by telemedicine. The patient expressed understanding and agreed to proceed.  Subjective:   [name] is a 82 y.o. female who presents for Medicare Annual (Subsequent) preventive examination.  Review of Systems     Cardiac Risk Factors include: advanced age (>34men, >13 women)     Objective:    Today's Vitals   09/28/22 1518  Weight: 140 lb (63.5 kg)  Height: 5\' 8"  (1.727 m)   Body mass index is 21.29 kg/m.     09/28/2022    3:23 PM 08/12/2022    3:01 PM 01/20/2022    3:24 PM 09/23/2021    4:22 PM 07/22/2021    3:34 PM 03/12/2021    2:13 PM 11/06/2020    1:10 PM  Advanced Directives  Does Patient Have a Medical Advance Directive? No No No No No No No  Would patient like information on creating a medical advance directive?    No - Patient declined  No - Patient declined     Current Medications (verified) Outpatient Encounter Medications as of 09/28/2022  Medication Sig   Aspirin-Acetaminophen-Caffeine (EXCEDRIN MIGRAINE PO) Take 1 tablet by mouth daily as needed (headache).   donepezil (ARICEPT) 10 MG tablet Take 1 tablet (10 mg total) by mouth at bedtime.   estradiol (ESTRACE) 1 MG tablet Take 1 tablet (1 mg total) by mouth daily.   levothyroxine (SYNTHROID) 50 MCG tablet Take 1 tablet (50 mcg total) by mouth daily.   memantine (NAMENDA) 10 MG tablet TAKE 1 TABLET TWICE A DAY   Multiple Vitamin (MULTIVITAMIN WITH MINERALS) TABS tablet Take 1 tablet by mouth at bedtime.   atorvastatin (LIPITOR) 10 MG tablet Take 1 tablet (10 mg total) by mouth daily. (Patient not taking: Reported on 08/12/2022)   No facility-administered encounter medications on file as of 09/28/2022.    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 1993; Adenocarcinoma   History of malignant neoplasm of ovary    1998 and 2004   Hypokalemia 01/17/2021   Hypothyroidism 05/11/2011   Peripheral neuropathy 09/06/2012   Syncopal episodes 08/04/2018   Vasomotor instability 09/16/2014   Vitamin D deficiency    Past Surgical History:  Procedure Laterality Date   BIOPSY THYROID     for benign disease   BREAST SURGERY     CHOLECYSTECTOMY     COLON SURGERY     colectomy for cancer   colonscopy  2004   2/08   left ankle fracture     status post surgery, March 2010   MASTECTOMY  1992   left mastectomy   PORT-A-CATH REMOVAL Right 04/15/2015   Procedure: REMOVAL PORT-A-CATH;  Surgeon: Avel Peace, MD;  Location: [facility];  Service: General;  Laterality: Right;  Right upper chest   TOTAL ABDOMINAL HYSTERECTOMY W/ BILATERAL SALPINGOOPHORECTOMY  1998  TAH for ovarian cancer   Family History  Problem Relation Age of Onset   Cancer Mother        ovarian; history of melanoma    Aneurysm Father        abdominal aortic   Heart disease Father    Diverticulitis Brother    Breast cancer Maternal Aunt    Social History   Socioeconomic History   Marital status: Single    Spouse name: Not on file   Number of children: 2   Years of education: 35   Highest education level: Master's degree (e.g., MA, MS, MEng, MEd, MSW, MBA)  Occupational History   Occupation: Retired  Tobacco Use   Smoking status: Former    Types: Cigarettes    Quit  date: 05/26/1991    Years since quitting: 31.3   Smokeless tobacco: Never  Vaping Use   Vaping Use: Never used  Substance and Sexual Activity   Alcohol use: Yes    Comment: occasional wine   Drug use: No   Sexual activity: Not on file  Other Topics Concern   Not on file  Social History Narrative   March 1998- history of stage III ovarian cancer; status post suboptimal debulking; status post 6 cycles of Taxoil and carboplatin with complete remission documented by second look surgery September 1998.      Recurrent ovarian cancer, status post 6 cycles carbo and Taxol, April 1998 until July 2008      1992, left breast intraductal carcinoma (DCIS)   Status post left modified radial mastectomy and with silicon implant   13 of 13 lymph modes negative; positive for BCRA1 gene   Recurrent ovarian carinoma, February 2008   Admit Feb. 2009 partial small bowel obstruction treated medically      Right handed    Social Determinants of Health   Financial Resource Strain: Low Risk  (09/28/2022)   Overall Financial Resource Strain (CARDIA)    Difficulty of Paying Living Expenses: Not hard at all  Food Insecurity: No Food Insecurity (09/28/2022)   Hunger Vital Sign    Worried About Running Out of Food in the Last Year: Never true    Ran Out of Food in the Last Year: Never true  Transportation Needs: No Transportation Needs (09/28/2022)   PRAPARE - Administrator, Civil Service (Medical): No    Lack of Transportation (Non-Medical): No  Physical Activity: Inactive (09/28/2022)   Exercise Vital Sign    Days of Exercise per Week: 0 days    Minutes of Exercise per Session: 0 min  Stress: No Stress Concern Present (09/28/2022)   [employer] of Occupational Health - Occupational Stress Questionnaire    Feeling of Stress : Not at all  Social Connections: Socially Isolated (09/23/2021)   Social Connection and Isolation Panel [NHANES]    Frequency of Communication with Friends and Family: More  than three times a week    Frequency of Social Gatherings with Friends and Family: More than three times a week    Attends Religious Services: Never    Database administrator or Organizations: No    Attends Engineer, structural: Never    Marital Status: Divorced    Tobacco Counseling Counseling given: Not Answered   Clinical Intake:  Pre-visit preparation completed: Yes  Pain : No/denies pain     Nutritional Status: BMI of 19-24  Normal Nutritional Risks: None Diabetes: No  How often do you need to have someone help you  when you read instructions, pamphlets, or other written materials from your doctor or pharmacy?: 1 - Never  Diabetic? no  Interpreter Needed?: No  Information entered by :: NAllen LPN   Activities of Daily Living    09/28/2022    3:23 PM  In your present state of health, do you have any difficulty performing the following activities:  Hearing? 0  Vision? 0  Difficulty concentrating or making decisions? 1  Walking or climbing stairs? 0  Dressing or bathing? 0  Doing errands, shopping? 0  Preparing Food and eating ? N  Using the Toilet? N  In the past six months, have you accidently leaked urine? N  Do you have problems with loss of bowel control? N  Managing your Medications? N  Managing your Finances? N  Housekeeping or managing your Housekeeping? N    Patient Care Team: Mliss Sax, MD as PCP - General (Family Medicine) Van Clines, MD as Consulting Physician (Neurology) Elwyn Reach (Neurology)  Indicate any recent Medical Services you may have received from other than [facility] providers in the past year (date may be approximate).     Assessment:   This is a routine wellness examination for Walker.  Hearing/Vision screen Vision Screening - Comments:: No regular eye exams  Dietary issues and exercise activities discussed: Current Exercise Habits: The patient does not participate in regular exercise at  present   Goals Addressed             This Visit's Progress    Patient Stated       09/28/2022, no goals       Depression Screen    09/28/2022    3:23 PM 01/29/2022    1:55 PM 09/23/2021    4:16 PM 07/28/2021    3:01 PM 03/12/2021    2:20 PM 01/17/2021    4:48 PM 01/17/2021    3:19 PM  PHQ 2/9 Scores  PHQ - 2 Score 0 0 0 0 2 3 0  PHQ- 9 Score     5 8     Fall Risk    09/28/2022    3:23 PM 08/12/2022    3:01 PM 01/29/2022    1:56 PM 01/20/2022    3:23 PM 09/23/2021    4:21 PM  Fall Risk   Falls in the past year? 0 0 0 0 0  Number falls in past yr: 0 0 0 0 0  Injury with Fall? 0 0  0 0  Risk for fall due to : Medication side effect    No Fall Risks  Follow up Falls prevention discussed;Education provided;Falls evaluation completed Falls evaluation completed       FALL RISK PREVENTION PERTAINING TO THE HOME:  Any stairs in or around the home? Yes  If so, are there any without handrails? No  Home free of loose throw rugs in walkways, pet beds, electrical cords, etc? Yes  Adequate lighting in your home to reduce risk of falls? Yes   ASSISTIVE DEVICES UTILIZED TO PREVENT FALLS:  Life alert? No  Use of a cane, walker or w/c? No  Grab bars in the bathroom? No  Shower chair or bench in shower? No  Elevated toilet seat or a handicapped toilet? No   TIMED UP AND GO:  Was the test performed? No .      Cognitive Function:  6 CIT not administered. Patient has diagnosis of cognitive impairment. She is followed by neurology.     01/20/2022  3:00 PM  MMSE - Mini Mental State Exam  Orientation to time 1  Orientation to Place 3  Registration 3  Attention/ Calculation 5  Recall 0  Language- name 2 objects 2  Language- repeat 1  Language- follow 3 step command 3  Language- read & follow direction 1  Write a sentence 1  Copy design 0  Total score 20      11/06/2020    1:00 PM 11/01/2018   10:00 AM  Montreal Cognitive Assessment   Visuospatial/ Executive (0/5) 4    Naming (0/3) 3   Attention: Read list of digits (0/2) 2 2  Attention: Read list of letters (0/1) 1 0  Attention: Serial 7 subtraction starting at 100 (0/3) 2 3  Language: Repeat phrase (0/2) 2 2  Language : Fluency (0/1) 1 0  Abstraction (0/2) 2 2  Delayed Recall (0/5) 0 0  Orientation (0/6) 2 3  Total 19   Adjusted Score (based on education) 19       09/23/2021    4:23 PM 11/29/2019    2:10 PM  6CIT Screen  What Year? 0 points 0 points  What month? 3 points 3 points  What time? 0 points 3 points  Count back from 20 0 points 0 points  Months in reverse 0 points 2 points  Repeat phrase 10 points 6 points  Total Score 13 points 14 points    Immunizations Immunization History  Administered Date(s) Administered   Influenza Whole 02/21/2021   PFIZER(Purple Top)SARS-COV-2 Vaccination 07/06/2019, 07/31/2019, 10/17/2020   Pneumococcal Conjugate-13 09/22/2017   Tdap 06/27/2012   Zoster Recombinat (Shingrix) 10/17/2020, 12/06/2020   Zoster, Live 03/28/2008    TDAP status: Due, Education has been provided regarding the importance of this vaccine. Advised may receive this vaccine at local pharmacy or Health Dept. Aware to provide a copy of the vaccination record if obtained from local pharmacy or Health Dept. Verbalized acceptance and understanding.  Flu Vaccine status: Up to date  Pneumococcal vaccine status: Up to date  Covid-19 vaccine status: Completed vaccines  Qualifies for Shingles Vaccine? Yes   Zostavax completed Yes   Shingrix Completed?: Yes  Screening Tests Health Maintenance  Topic Date Due   MAMMOGRAM  03/26/2022   DTaP/Tdap/Td (2 - Td or Tdap) 06/27/2022   Medicare Annual Wellness (AWV)  09/24/2022   DEXA SCAN  Completed   Zoster Vaccines- Shingrix  Completed   HPV VACCINES  Aged Out   Pneumonia Vaccine 35+ Years old  Discontinued   INFLUENZA VACCINE  Discontinued   COVID-19 Vaccine  Discontinued    Health Maintenance  Health Maintenance Due  Topic  Date Due   MAMMOGRAM  03/26/2022   DTaP/Tdap/Td (2 - Td or Tdap) 06/27/2022   Medicare Annual Wellness (AWV)  09/24/2022    Colorectal cancer screening: No longer required.   Mammogram status: No longer required due to age.  Bone Density status: Completed 03/11/2022.   Lung Cancer Screening: (Low Dose CT Chest recommended if Age 50-80 years, 30 pack-year currently smoking OR have quit w/in 15years.) does not qualify.   Lung Cancer Screening Referral: no  Additional Screening:  Hepatitis C Screening: does not qualify;   Vision Screening: Recommended annual ophthalmology exams for early detection of glaucoma and other disorders of the eye. Is the patient up to date with their annual eye exam?  No  Who is the provider or what is the name of the office in which the patient attends annual eye exams? none  If pt is not established with a provider, would they like to be referred to a provider to establish care? No .   Dental Screening: Recommended annual dental exams for proper oral hygiene  Community Resource Referral / Chronic Care Management: CRR required this visit?  No   CCM required this visit?  No      Plan:     I have personally reviewed and noted the following in the patient's chart:   Medical and social history Use of alcohol, tobacco or illicit drugs  Current medications and supplements including opioid prescriptions. Patient is not currently taking opioid prescriptions. Functional ability and status Nutritional status Physical activity Advanced directives List of other physicians Hospitalizations, surgeries, and ER visits in previous 12 months Vitals Screenings to include cognitive, depression, and falls Referrals and appointments  In addition, I have reviewed and discussed with patient certain preventive protocols, quality metrics, and best practice recommendations. A written personalized care plan for preventive services as well as general preventive health  recommendations were provided to patient.     Barb Merino, LPN   07/03/5282   Nurse Notes: none  Due to this being a virtual visit, the after visit summary with patients personalized plan was offered to patient via mail or my-chart.  to pick up at office at next visit

## 2022-10-12 DIAGNOSIS — Z961 Presence of intraocular lens: Secondary | ICD-10-CM | POA: Diagnosis not present

## 2022-10-12 DIAGNOSIS — H04123 Dry eye syndrome of bilateral lacrimal glands: Secondary | ICD-10-CM | POA: Diagnosis not present

## 2022-10-12 DIAGNOSIS — H5212 Myopia, left eye: Secondary | ICD-10-CM | POA: Diagnosis not present

## 2023-03-01 ENCOUNTER — Ambulatory Visit: Payer: Medicare HMO | Admitting: Neurology

## 2023-03-01 ENCOUNTER — Encounter: Payer: Self-pay | Admitting: Neurology

## 2023-03-01 VITALS — BP 115/73 | HR 62 | Ht 68.0 in | Wt 142.6 lb

## 2023-03-01 DIAGNOSIS — G3184 Mild cognitive impairment, so stated: Secondary | ICD-10-CM | POA: Diagnosis not present

## 2023-03-01 MED ORDER — MEMANTINE HCL 10 MG PO TABS: ORAL_TABLET | ORAL | 3 refills | Status: DC

## 2023-03-01 MED ORDER — DONEPEZIL HCL 10 MG PO TABS: 10.0000 mg | ORAL_TABLET | Freq: Every day | ORAL | 3 refills | Status: DC

## 2023-03-01 NOTE — Progress Notes (Signed)
NEUROLOGY FOLLOW UP OFFICE NOTE  [name] [id number] 04/03/1941  HISTORY OF PRESENT ILLNESS: I had the pleasure of seeing [name] in follow-up in the neurology clinic on 03/01/2023.  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 She is again accompanied by her significant other [name] who helps supplement the history today.  Since her last visit, [name] reports memory keeps getting worse, she is "forgetting or really just doesn't care." She continues to live alone, [name] reports she does not want help. He picks her her medications but he is not sure if she fills her pillbox or takes them regularly. She does not know the names of her medications. She had an issue with paying [employer] twice but this has not recurred since. She states she does not drive a lot and denies getting lost driving. They spent a lot of time yesterday looking for her phone, she asks "did we find it?" She is prescribed Donepezil 10mg  daily and Memantine 10mg  BID, she denies any side effects. No headaches, dizziness, vision changes, focal numbness/tingling/weakness. Sleep is okay. Mood is fine, no paranoia or hallucinations. She has the paperwork to appoint her daughter as POA, they have not signed papers with lawyer yet.    History on Initial Assessment 10/24/2018: This is a 82 year old right-handed woman with a history of hypothyroidism, ovarian cancer, breast cancer, presenting for evaluation memory loss. Her significant other [name] is present during the e-visit to provide additional information. She had 2 syncopal episodes in one day last March 2020, and on follow-up with her PCP, memory issues were brought up. She has been taking Donepezil 10mg  since 2018 previously prescribed by Dr. Amador Cunas. She initially did not remember the syncopal episode in March, but on further questioning, she recalls she was at the check out aisle and did not feel good at that time and woke up on the  ground, no tongue bite or incontinence. She apparently passed out twice in the store. She states that she was pretty sure it occurred because she was starving. Bloodwork, EKG, and head CT were unremarkable, no further similar spells since then. She feels her memory is "not good," but she states she is doing fine living alone. [name] lives a mile away and sees her everyday. He has known her for over 30 years and started notice short-term memory issues worse in the past year, for instance she did not remember the movie they watched last night. He states she has not gotten lost driving, and she has managed to stay on top of her bills and medications. She denies any difficulties as well. She denies leaving the stove on or faucet running. There is no family history of dementia, she denies any history of significant head injuries, she rarely drinks alcohol.  She denies any headaches, dizziness, vision changes, focal numbness/tingling/weakness, neck/back pain, bowel/bladder dysfunction, anosmia, or tremors. Sleep has always been a problem. No wandering behavior, paranoia, or hallucinations. [name] denies any staring/unresponsive episodes. She denies any olfactory/gustatory hallucinations, rising epigastric sensation, myoclonic jerks. She has a daughter living in [place] who she sees once a week.   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 Compared to prior testing, there was decline noted across encoding aspects of  memory, cognitive flexibility, and semantic fluency. Consistent decline was also observed across attention/concentration. Etiology remains concerning for Alzheimer's disease.   Brain MRI without contrast done  01/2022: no acute changes. 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 1993; Adenocarcinoma   History of malignant neoplasm of ovary    1998 and 2004   Hypokalemia 01/17/2021   Hypothyroidism 05/11/2011   Peripheral neuropathy 09/06/2012   Syncopal episodes 08/04/2018   Vasomotor instability 09/16/2014   Vitamin D deficiency     MEDICATIONS: Current Outpatient Medications on File Prior to Visit  Medication Sig Dispense Refill   Aspirin-Acetaminophen-Caffeine (EXCEDRIN MIGRAINE PO) Take 1 tablet by mouth daily as needed (headache).     donepezil (ARICEPT) 10 MG tablet Take 1 tablet (10 mg total) by mouth at bedtime. 90 tablet 3   estradiol (ESTRACE) 1 MG tablet Take 1 tablet (1 mg total) by mouth daily. 30 tablet 1   atorvastatin (LIPITOR) 10 MG tablet Take 1 tablet (10 mg total) by mouth daily. (Patient not taking: Reported on 03/01/2023) 90 tablet 3   levothyroxine (SYNTHROID) 50 MCG tablet Take 1 tablet (50 mcg total) by mouth daily. (Patient not taking: Reported on 03/01/2023) 90 tablet 0   memantine (NAMENDA) 10 MG tablet TAKE 1 TABLET TWICE A DAY 180 tablet 3   Multiple Vitamin (MULTIVITAMIN WITH MINERALS) TABS tablet Take 1 tablet by mouth at bedtime.     No current facility-administered medications on file prior to visit.    ALLERGIES: Allergies  Allergen Reactions   Codeine Sulfate Nausea And Vomiting    Projectile vomiting   Promethazine Hcl Other (See Comments)    Unknown  reaction    FAMILY HISTORY: Family History  Problem Relation Age of Onset   Cancer Mother        ovarian; history of melanoma    Aneurysm Father        abdominal aortic   Heart disease Father    Diverticulitis Brother    Breast cancer Maternal Aunt     SOCIAL HISTORY: Social History   Socioeconomic History   Marital status: Single    Spouse name: Not on file   Number of children: 2   Years of education: 18   Highest education level: Master's degree (e.g., MA, MS, MEng, MEd, MSW, MBA)  Occupational History   Occupation: Retired  Tobacco Use   Smoking status: Former    Current packs/day: 0.00    Types: Cigarettes    Quit date: 05/26/1991    Years since quitting: 31.7   Smokeless tobacco: Never  Vaping Use   Vaping status: Never Used  Substance and Sexual Activity   Alcohol use: Yes    Comment: occasional wine   Drug use: No   Sexual activity: Not on file  Other Topics Concern   Not on file  Social History Narrative   March 1998- history of stage III ovarian cancer; status post suboptimal debulking; status post 6 cycles of Taxoil and carboplatin with complete remission documented by  second look surgery September 1998.      Recurrent ovarian cancer, status post 6 cycles carbo and Taxol, April 1998 until July 2008      1992, left breast intraductal carcinoma (DCIS)   Status post left modified radial mastectomy and with silicon implant   13 of 13 lymph modes negative; positive for BCRA1 gene   Recurrent ovarian carinoma, February 2008   Admit Feb. 2009 partial small bowel obstruction treated medically      Right handed    Social Determinants of Health   Financial Resource Strain: Low Risk  (09/28/2022)   Overall Financial Resource Strain (CARDIA)    Difficulty of Paying Living Expenses: Not hard at all  Food Insecurity: No Food Insecurity (09/28/2022)   Hunger Vital Sign    Worried About Running Out of Food in the Last Year: Never true    Ran Out of Food in the Last  Year: Never true  Transportation Needs: No Transportation Needs (09/28/2022)   PRAPARE - Administrator, Civil Service (Medical): No    Lack of Transportation (Non-Medical): No  Physical Activity: Inactive (09/28/2022)   Exercise Vital Sign    Days of Exercise per Week: 0 days    Minutes of Exercise per Session: 0 min  Stress: No Stress Concern Present (09/28/2022)   [employer] of Occupational Health - Occupational Stress Questionnaire    Feeling of Stress : Not at all  Social Connections: Socially Isolated (09/23/2021)   Social Connection and Isolation Panel [NHANES]    Frequency of Communication with Friends and Family: More than three times a week    Frequency of Social Gatherings with Friends and Family: More than three times a week    Attends Religious Services: Never    Database administrator or Organizations: No    Attends Banker Meetings: Never    Marital Status: Divorced  Catering manager Violence: Not At Risk (09/23/2021)   Humiliation, Afraid, Rape, and Kick questionnaire    Fear of Current or Ex-Partner: No    Emotionally Abused: No    Physically Abused: No    Sexually Abused: No     PHYSICAL EXAM: Vitals:   03/01/23 1407  BP: 115/73  Pulse: 62  SpO2: 96%   General: No acute distress Head:  Normocephalic/atraumatic Skin/Extremities: No rash, no edema Neurological Exam: alert and oriented to person, place. No aphasia or dysarthria. Fund of knowledge is appropriate.  Recent and remote memory are impaired.  Attention and concentration are normal. MMSE 22/30    03/01/2023    2:00 PM 01/20/2022    3:00 PM  MMSE - Mini Mental State Exam  Orientation to time 0 1  Orientation to Place 5 3  Registration 3 3  Attention/ Calculation 5 5  Recall 0 0  Language- name 2 objects 2 2  Language- repeat 1 1  Language- follow 3 step command 3 3  Language- read & follow direction 1 1  Write a sentence 1 1  Copy design 1 0  Total score 22 20    Cranial nerves: Pupils equal, round. Extraocular movements intact.  No facial asymmetry.  Motor: moves all extremities symmetrically at least anti-gravity x 4. Gait narrow-based and steady, no ataxia.   IMPRESSION: This is an 82 yo RH woman with a history of hypothyroidism, breast cancer, ovarian cancer, memory loss, likely due to Alzheimer's disease. Neurocognitive testing in December 2022 provided a diagnosis of MCI, mainly due to report of managing  complex tasks fine. MMSE today 22/30. [name] reports progressive short-term memory loss. Continue Donepezil 10mg  daily and Memantine 10mg  BID, refills sent. She rescheduled Neuropsychological testing because she cannot wake up for 8am November appointment, she is scheduled out to August 2025. Continue close supervision, monitor driving. We discussed safety issues and needing more help at home. Follow-up after Neuropsych testing, call for any changes.     Thank you for allowing me to participate in her care.  Please do not hesitate to call for any questions or concerns.    Patrcia Dolly, M.D.   CC: Dr. Doreene Burke

## 2023-03-01 NOTE — Patient Instructions (Signed)
Good to see you. Continue all your medications. Follow-up after Neurocognitive testing, call for any changes   FALL PRECAUTIONS: Be cautious when walking. Scan the area for obstacles that may increase the risk of trips and falls. When getting up in the mornings, sit up at the edge of the bed for a few minutes before getting out of bed. Consider elevating the bed at the head end to avoid drop of blood pressure when getting up. Walk always in a well-lit room (use night lights in the walls). Avoid area rugs or power cords from appliances in the middle of the walkways. Use a walker or a cane if necessary and consider physical therapy for balance exercise. Get your eyesight checked regularly.  FINANCIAL OVERSIGHT: Supervision, especially oversight when making financial decisions or transactions is also recommended.  HOME SAFETY: Consider the safety of the kitchen when operating appliances like stoves, microwave oven, and blender. Consider having supervision and share cooking responsibilities until no longer able to participate in those. Accidents with firearms and other hazards in the house should be identified and addressed as well.  DRIVING: Regarding driving, in patients with progressive memory problems, driving will be impaired. We advise to have someone else do the driving if trouble finding directions or if minor accidents are reported. Independent driving assessment is available to determine safety of driving.  ABILITY TO BE LEFT ALONE: If patient is unable to contact 911 operator, consider using LifeLine, or when the need is there, arrange for someone to stay with patients. Smoking is a fire hazard, consider supervision or cessation. Risk of wandering should be assessed by caregiver and if detected at any point, supervision and safe proof recommendations should be instituted.  MEDICATION SUPERVISION: Inability to self-administer medication needs to be constantly addressed. Implement a mechanism to  ensure safe administration of the medications.  RECOMMENDATIONS FOR ALL PATIENTS WITH MEMORY PROBLEMS: 1. Continue to exercise (Recommend 30 minutes of walking everyday, or 3 hours every week) 2. Increase social interactions - continue going to [place] and enjoy social gatherings with friends and family 3. Eat healthy, avoid fried foods and eat more fruits and vegetables 4. Maintain adequate blood pressure, blood sugar, and blood cholesterol level. Reducing the risk of stroke and cardiovascular disease also helps promoting better memory. 5. Avoid stressful situations. Live a simple life and avoid aggravations. Organize your time and prepare for the next day in anticipation. 6. Sleep well, avoid any interruptions of sleep and avoid any distractions in the bedroom that may interfere with adequate sleep quality 7. Avoid sugar, avoid sweets as there is a strong link between excessive sugar intake, diabetes, and cognitive impairment The Mediterranean diet has been shown to help patients reduce the risk of progressive memory disorders and reduces cardiovascular risk. This includes eating fish, eat fruits and green leafy vegetables, nuts like almonds and hazelnuts, walnuts, and also use olive oil. Avoid fast foods and fried foods as much as possible. Avoid sweets and sugar as sugar use has been linked to worsening of memory function.  There is always a concern of gradual progression of memory problems. If this is the case, then we may need to adjust level of care according to patient needs. Support, both to the patient and caregiver, should then be put into place.

## 2023-04-12 ENCOUNTER — Institutional Professional Consult (permissible substitution): Payer: Medicare HMO | Admitting: Psychology

## 2023-04-12 ENCOUNTER — Ambulatory Visit: Payer: Self-pay

## 2023-04-19 ENCOUNTER — Encounter: Payer: Medicare HMO | Admitting: Psychology

## 2023-10-13 DIAGNOSIS — Z961 Presence of intraocular lens: Secondary | ICD-10-CM | POA: Diagnosis not present

## 2023-10-13 DIAGNOSIS — H5212 Myopia, left eye: Secondary | ICD-10-CM | POA: Diagnosis not present

## 2023-10-13 DIAGNOSIS — H04123 Dry eye syndrome of bilateral lacrimal glands: Secondary | ICD-10-CM | POA: Diagnosis not present

## 2024-01-13 ENCOUNTER — Ambulatory Visit: Payer: Self-pay

## 2024-01-13 ENCOUNTER — Encounter: Payer: Self-pay | Admitting: Psychology

## 2024-01-13 ENCOUNTER — Ambulatory Visit: Payer: Medicare HMO | Admitting: Psychology

## 2024-01-13 DIAGNOSIS — F0280 Dementia in other diseases classified elsewhere without behavioral disturbance: Secondary | ICD-10-CM

## 2024-01-13 DIAGNOSIS — G309 Alzheimer's disease, unspecified: Secondary | ICD-10-CM | POA: Diagnosis not present

## 2024-01-13 DIAGNOSIS — F02B0 Dementia in other diseases classified elsewhere, moderate, without behavioral disturbance, psychotic disturbance, mood disturbance, and anxiety: Secondary | ICD-10-CM

## 2024-01-13 DIAGNOSIS — R4189 Other symptoms and signs involving cognitive functions and awareness: Secondary | ICD-10-CM

## 2024-01-13 HISTORY — DX: Dementia in other diseases classified elsewhere, unspecified severity, without behavioral disturbance, psychotic disturbance, mood disturbance, and anxiety: F02.80

## 2024-01-13 NOTE — Progress Notes (Unsigned)
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 Below Average    Picture Naming 9/10 9/10 8/10 3-9 Well Below Average    Semantic Fluency 8 6 4 2  Well Below Average  Attention 97 97 85 16 Below Average    Digit Span 15 17 14  91 Well Above Average    Coding 4 2 1  <1 Exceptionally Low  Delayed Memory 40 40 40 <1 Exceptionally Low    List Recall 0/10 0/10 0/10 <2 Exceptionally Low    List Recognition 11/20 10/20 11/20  <2 Exceptionally Low    Story Recall 1 1 1  <1 Exceptionally Low    Story Recognition 4/12 Pt. refused 7/12 7-13 Well Below Average to Below Average    Figure Recall 1 1 1  <1 Exceptionally Low    Figure Recognition 0/8 Pt. refused 1/8 <1 Exceptionally Low          Intellectual Functioning:        Standard Score Standard Score Standard Score Percentile   Test of Premorbid Functioning: 113 113 99 47 Average         Attention/Executive Function:       Trail Making Test (TMT): Raw Score (T Score) Raw Score (T Score) Raw Score (T Score)  Percentile     Part A 86 secs.,  0 errors (23) 78 secs.,  1 error (27) 230 secs.,  0 errors (18) <1 Exceptionally Low    Part B 122 secs.,  0 errors (44) 183 secs.,  1 error (35) 296 secs.,  1 error (22) <1 Exceptionally Low           Scaled Score Scaled Score Scaled Score Percentile   WAIS-IV Digit Span: 15 11 10  50 Average    Forward 17 14 9  37 Average    Backward 15 10 10  50 Average    Sequencing 11 10 10  50 Average         D-KEFS Color-Word Interference Test: Raw Score (Scaled Score) Raw Score (Scaled Score) Raw Score (Scaled Score) Percentile     Color Naming 34 secs. (11) 36 secs. (10) 53 secs. (3) 1 Exceptionally Low    Word Reading 28 secs. (9) 29 secs. (9) 56 secs. (1) <1 Exceptionally Low    Inhibition 59 secs. (14) 61 secs. (14) 75 secs. (12) 75 Above Average      Total Errors 4 errors (9) 4 errors (9) 2 errors (11) 63 Average    Inhibition/Switching 121  secs. (7) Discontinued 128 secs. (7) 16 Below Average      Total Errors 10 errors (4) --- 19 errors (1) <1 Exceptionally Low         NAB Executive Functions Module, Form 1: T Score T Score T Score Percentile     Judgment 05 67 55 69 Average         Language:       Verbal Fluency Test: Raw Score (Scaled Score) Raw Score (Scaled Score) Raw Score (Scaled Score) Percentile     Phonemic Fluency (CFL) 18 (6) 32 (10) 28 (9) 37 Average    Category Fluency 36 (10) 29 (7) 20 (5) 5 Well Below Average  *Based on [employer] Older Normative Studies (MOANS)              NAB Language Module, Form 1: T Score T Score T Score Percentile     Auditory Comprehension 57 57 58 79 Above Average    Naming 31/31 (64) 31/31 (64) 30/31 (63) 91 Well Above Average         Visuospatial/Visuoconstruction:        Raw Score Raw Score Raw Score Percentile   Clock Drawing: 9/10 8/10 7/10 --- Within Normal Limits          Scaled Score Scaled Score Scaled Score Percentile   WAIS-IV Block Design: --- 11 5 5  Well Below Average         Mood and  Personality:        Raw Score Raw Score Raw Score Percentile   PROMIS Depression Questionnaire: --- --- 8 --- None to Slight  PROMIS Anxiety Questionnaire: --- --- 7 --- None to Slight         Additional Questionnaires:        Raw Score Raw Score Raw Score Percentile   PROMIS Sleep Disturbance Questionnaire: 17 13 10  --- None to Slight   Informed Consent and Coding/Compliance:   The current evaluation represents a clinical evaluation for the purposes previously outlined by the referral source and is in no way reflective of a forensic evaluation.   Ms. Deangelo was provided with a verbal description of the nature and purpose of the present neuropsychological evaluation. Also reviewed were the foreseeable risks and/or discomforts and benefits of the procedure, limits of confidentiality, and mandatory reporting requirements of this provider. The patient was given the opportunity to ask questions and receive answers about the evaluation. Oral consent to participate was provided by the patient.   This evaluation was conducted by Arthea KYM Maryland, Ph.D., ABPP-CN, board certified clinical neuropsychologist. Ms. Laughner completed a clinical interview with Dr. Maryland, billed as one unit [phone], and 125 minutes of cognitive testing and scoring, billed as one unit [phone] and three additional units [id number]. Psychometrist Luke Pitcher, B.S. assisted Dr. Maryland with test administration and scoring procedures. As a separate and discrete service, one unit [phone] and two units 96133 (160 minutes) were billed for Dr. Loralee time spent in interpretation and report writing.

## 2024-01-13 NOTE — Progress Notes (Signed)
   Psychometrician Note   Cognitive testing was administered to [name] by Luke Pitcher, B.S. (psychometrist) under the supervision of Dr. Zachary C. Merz, Ph.D., ABPP, licensed psychologist on 01/13/2024. [name] did not appear overtly distressed by the testing session per behavioral observation or responses across self-report questionnaires. Rest breaks were offered.    The battery of tests administered was selected by Dr. Zachary C. Merz, Ph.D., ABPP with consideration to [name]'s current level of functioning, the nature of her symptoms, emotional and behavioral responses during interview, level of literacy, observed level of motivation/effort, and the nature of the referral question. This battery was communicated to the psychometrist. Communication between Dr. Arthea KYM Maryland, Ph.D., ABPP and the psychometrist was ongoing throughout the evaluation and Dr. Arthea KYM Maryland, Ph.D., ABPP was immediately accessible at all times. Dr. Zachary C. Merz, Ph.D., ABPP provided supervision to the psychometrist on the date of this service to the extent necessary to assure the quality of all services provided.    [name] will return within approximately 1-2 weeks for an interactive feedback session with Dr. Maryland at which time her test performances, clinical impressions, and treatment recommendations will be reviewed in detail. [name] understands she can contact our office should she require our assistance before this time.  A total of 125 minutes of billable time were spent face-to-face with [name] by the psychometrist. This includes both test administration and scoring time. Billing for these services is reflected in the clinical report generated by Dr. Arthea KYM Maryland, Ph.D., ABPP  This note reflects time spent with the psychometrician and does not include test scores or any clinical interpretations made by Dr. Maryland. The full report will follow in a separate  note.

## 2024-01-18 ENCOUNTER — Telehealth: Payer: Self-pay | Admitting: Neurology

## 2024-01-18 ENCOUNTER — Other Ambulatory Visit: Payer: Self-pay | Admitting: Family Medicine

## 2024-01-18 DIAGNOSIS — E785 Hyperlipidemia, unspecified: Secondary | ICD-10-CM

## 2024-01-18 DIAGNOSIS — E039 Hypothyroidism, unspecified: Secondary | ICD-10-CM

## 2024-01-18 DIAGNOSIS — E2839 Other primary ovarian failure: Secondary | ICD-10-CM

## 2024-01-18 NOTE — Telephone Encounter (Signed)
 Copied from CRM [id number]. Topic: Clinical - Medication Refill >> Jan 18, 2024  2:46 PM [place] A wrote: Medication: atorvastatin  (LIPITOR) 10 MG tablet;levothyroxine  (SYNTHROID ) 50 MCG tablet;estradiol  (ESTRACE ) 1 MG tablet  Has the patient contacted their pharmacy? No (Agent: If no, request that the patient contact the pharmacy for the refill. If patient does not wish to contact the pharmacy document the reason why and proceed with request.) (Agent: If yes, when and what did the pharmacy advise?)  This is the patient's preferred pharmacy:   [facility] - [place], [place] - [address] [place] [address] Phone: [phone] Fax: [phone]  Is this the correct pharmacy for this prescription? Yes If no, delete pharmacy and type the correct one.   Has the prescription been filled recently? No  Is the patient out of the medication? Yes  Has the patient been seen for an appointment in the last year OR does the patient have an upcoming appointment? Yes  Can we respond through MyChart? Yes  Agent: Please be advised that Rx refills may take up to 3 business days. We ask that you follow-up with your pharmacy.

## 2024-01-18 NOTE — Telephone Encounter (Signed)
 Pt.s husband  wants 2 Rx's Pharmacy changed to [facility] (memantine  (NAMENDA ) 10 MG tablet / donepezil  (ARICEPT ) 10 MG tablet )

## 2024-01-20 ENCOUNTER — Telehealth: Payer: Self-pay | Admitting: Neurology

## 2024-01-20 ENCOUNTER — Ambulatory Visit (INDEPENDENT_AMBULATORY_CARE_PROVIDER_SITE_OTHER): Payer: Medicare HMO | Admitting: Psychology

## 2024-01-20 ENCOUNTER — Other Ambulatory Visit: Payer: Self-pay

## 2024-01-20 DIAGNOSIS — F0280 Dementia in other diseases classified elsewhere without behavioral disturbance: Secondary | ICD-10-CM | POA: Diagnosis not present

## 2024-01-20 DIAGNOSIS — G309 Alzheimer's disease, unspecified: Secondary | ICD-10-CM

## 2024-01-20 DIAGNOSIS — E039 Hypothyroidism, unspecified: Secondary | ICD-10-CM

## 2024-01-20 MED ORDER — MEMANTINE HCL 10 MG PO TABS: ORAL_TABLET | ORAL | 0 refills | Status: DC

## 2024-01-20 MED ORDER — DONEPEZIL HCL 10 MG PO TABS: 10.0000 mg | ORAL_TABLET | Freq: Every day | ORAL | 0 refills | Status: DC

## 2024-01-20 MED ORDER — LEVOTHYROXINE SODIUM 50 MCG PO TABS: 50.0000 ug | ORAL_TABLET | Freq: Every day | ORAL | 0 refills | Status: DC

## 2024-01-20 NOTE — Progress Notes (Signed)
   Neuropsychology Feedback Session Jolynn DEL. [facility] [place] Department of Neurology  Reason for Referral:   [name] is a 83 y.o. right-handed Caucasian female referred by Darice Shivers, M.D., to characterize her current cognitive functioning and assist with diagnostic clarity and treatment planning in the context of a previously diagnosed amnestic mild neurocognitive disorder, concerns for underlying Alzheimer's disease, and concern for progressive cognitive decline.   Feedback:   [name] completed a comprehensive neuropsychological evaluation on 01/13/2024. Please refer to that encounter for the full report and recommendations. Briefly, results suggested severe impairment surrounding processing speed, semantic fluency, and both delayed retrieval and recognition/consolidation aspects of memory. Further weakness/variability was exhibited across executive functioning, visuospatial abilities, and encoding (i.e., learning) aspects of memory. Relative to her initial evaluation in May 2021, quite noteworthy decline was exhibited across processing speed, cognitive flexibility, visuoconstructional abilities, and semantic fluency. Mild but still significant decline was also exhibited across encoding aspects of memory. Severe impairment and amnestic performances across delayed retrieval aspects of memory has been exhibited across all evaluation time points. Regarding the cause for her dementia presentation, concerns surrounding underlying Alzheimer's disease remain. [name] was fully amnestic (i.e., 0% retention) across all memory tasks after a brief delay. She also performed poorly across follow-up recognition trials. Taken together, this suggests evidence for rapid forgetting and a pronounced storage impairment, both of which are the hallmark testing patterns associated with this illness. Evidence for progressive cognitive decline over time (see above) further elevates  concerns. Further impairments surrounding semantic fluency would also follow typical disease progression. Intact confrontation naming remains encouraging and she may be benefiting from having a higher degree of cognitive reserve. However, intact naming abilities would not preclude Alzheimer's disease from being present. Continued medical monitoring will be beneficial moving forward.   [name] was accompanied by her partner Dempsey during the current telephone call. She was within her residence while I was within my office. I discussed the limitations of evaluation and management by telemedicine and the availability of in person appointments. [name] expressed her understanding and agreed to proceed. Content of the current session focused on the results of her neuropsychological evaluation. [name] was given the opportunity to ask questions and her questions were answered. She was encouraged to reach out should additional questions arise. A copy of her report was mailed at the conclusion of her previous testing visit.      One unit 96132 (31 minutes) was billed for Dr. Loralee time spent preparing for, conducting, and documenting the current feedback session with [name].

## 2024-01-20 NOTE — Telephone Encounter (Signed)
 [name] called in for Pt and he stated that the prescriptions needs to go to [facility].Thanks

## 2024-01-20 NOTE — Telephone Encounter (Signed)
Sent to Dr. Aquino 

## 2024-01-20 NOTE — Telephone Encounter (Signed)
 Resent rx to [facility]

## 2024-01-31 ENCOUNTER — Encounter: Payer: Self-pay | Admitting: Neurology

## 2024-01-31 ENCOUNTER — Ambulatory Visit (INDEPENDENT_AMBULATORY_CARE_PROVIDER_SITE_OTHER): Payer: Medicare HMO | Admitting: Neurology

## 2024-01-31 VITALS — BP 115/70 | HR 87 | Ht 68.0 in | Wt 134.0 lb

## 2024-01-31 DIAGNOSIS — G309 Alzheimer's disease, unspecified: Secondary | ICD-10-CM

## 2024-01-31 DIAGNOSIS — F02B0 Dementia in other diseases classified elsewhere, moderate, without behavioral disturbance, psychotic disturbance, mood disturbance, and anxiety: Secondary | ICD-10-CM

## 2024-01-31 MED ORDER — DONEPEZIL HCL 10 MG PO TABS: 10.0000 mg | ORAL_TABLET | Freq: Every day | ORAL | 4 refills | Status: AC

## 2024-01-31 MED ORDER — MEMANTINE HCL 10 MG PO TABS: ORAL_TABLET | ORAL | 4 refills | Status: AC

## 2024-01-31 NOTE — Patient Instructions (Signed)
 Good to see you.  Continue Donepezil  10mg  daily and Memantine  10mg  twice a day  2. Look into an automated pillbox with alarm to help with medication reminders  3. Follow-up in 6 months, call for any changes   FALL PRECAUTIONS: Be cautious when walking. Scan the area for obstacles that may increase the risk of trips and falls. When getting up in the mornings, sit up at the edge of the bed for a few minutes before getting out of bed. Consider elevating the bed at the head end to avoid drop of blood pressure when getting up. Walk always in a well-lit room (use night lights in the walls). Avoid area rugs or power cords from appliances in the middle of the walkways. Use a walker or a cane if necessary and consider physical therapy for balance exercise. Get your eyesight checked regularly.  FINANCIAL OVERSIGHT: Supervision, especially oversight when making financial decisions or transactions is also recommended.  HOME SAFETY: Consider the safety of the kitchen when operating appliances like stoves, microwave oven, and blender. Consider having supervision and share cooking responsibilities until no longer able to participate in those. Accidents with firearms and other hazards in the house should be identified and addressed as well.   ABILITY TO BE LEFT ALONE: If patient is unable to contact 911 operator, consider using LifeLine, or when the need is there, arrange for someone to stay with patients. Smoking is a fire hazard, consider supervision or cessation. Risk of wandering should be assessed by caregiver and if detected at any point, supervision and safe proof recommendations should be instituted.  MEDICATION SUPERVISION: Inability to self-administer medication needs to be constantly addressed. Implement a mechanism to ensure safe administration of the medications.  RECOMMENDATIONS FOR ALL PATIENTS WITH MEMORY PROBLEMS: 1. Continue to exercise (Recommend 30 minutes of walking everyday, or 3 hours  every week) 2. Increase social interactions - continue going to [place] and enjoy social gatherings with friends and family 3. Eat healthy, avoid fried foods and eat more fruits and vegetables 4. Maintain adequate blood pressure, blood sugar, and blood cholesterol level. Reducing the risk of stroke and cardiovascular disease also helps promoting better memory. 5. Avoid stressful situations. Live a simple life and avoid aggravations. Organize your time and prepare for the next day in anticipation. 6. Sleep well, avoid any interruptions of sleep and avoid any distractions in the bedroom that may interfere with adequate sleep quality 7. Avoid sugar, avoid sweets as there is a strong link between excessive sugar intake, diabetes, and cognitive impairment The Mediterranean diet has been shown to help patients reduce the risk of progressive memory disorders and reduces cardiovascular risk. This includes eating fish, eat fruits and green leafy vegetables, nuts like almonds and hazelnuts, walnuts, and also use olive oil. Avoid fast foods and fried foods as much as possible. Avoid sweets and sugar as sugar use has been linked to worsening of memory function.  There is always a concern of gradual progression of memory problems. If this is the case, then we may need to adjust level of care according to patient needs. Support, both to the patient and caregiver, should then be put into place.

## 2024-01-31 NOTE — Progress Notes (Signed)
 NEUROLOGY FOLLOW UP OFFICE NOTE  [name] [id number] 24-Apr-1941  HISTORY OF PRESENT ILLNESS: I had the pleasure of seeing [name] in follow-up in the neurology clinic on 01/31/2024.  The patient was last seen 84 year ago for memory loss. She is again accompanied by her significant other Dempsey who helps supplement the history today.  Records and images were personally reviewed where available.  Since her last visit, she had repeat Neuropsychological testing 12/2023 with a diagnosis of Major Neurocognitive Disorder (dementia). Compared to prior testing in 2021, there was decline seen in processing speed, cognitive flexibility, visuoconstructional abilities, and semantic fluency. Etiology still concerning for underlying Alzheimer's disease. She is on Donepezil  10mg  daily and Memantine  10mg  BID without side effects. Dempsey reports she only has a 2-minute retention. Despite good vision, she has difficulty signing on the line. She continues to live alone. She is no longer driving. Dempsey reports concern about medication compliance. He has been helping with taxes the past 2 years. He sees the bills on her bedside but she says they are private, I want to do them. They are trying to set bills on autopay. One time she forgot to pay her credit card and got behind they are still working on getting her daughter as her POA. She denies any headaches, dizziness, diplopia, dysarthria, dysphagia, neck/back pain, focal numbness/tingling/weakness, bowel/bladder dysfunction. Mood is fine, no paranoia or hallucinations.    History on Initial Assessment 10/24/2018: This is a 83 year old right-handed woman with a history of hypothyroidism, ovarian cancer, breast cancer, presenting for evaluation memory loss. Her significant other Dempsey is present during the e-visit to provide additional information. She had 2 syncopal episodes in one day last March 2020, and on follow-up with her PCP, memory issues were brought up.  She has been taking Donepezil  10mg  since 2018 previously prescribed by Dr. Jame. She initially did not remember the syncopal episode in March, but on further questioning, she recalls she was at the check out aisle and did not feel good at that time and woke up on the ground, no tongue bite or incontinence. She apparently passed out twice in the store. She states that she was pretty sure it occurred because she was starving. Bloodwork, EKG, and head CT were unremarkable, no further similar spells since then. She feels her memory is not good, but she states she is doing fine living alone. Dempsey lives a mile away and sees her everyday. He has known her for over 30 years and started notice short-term memory issues worse in the past year, for instance she did not remember the movie they watched last night. He states she has not gotten lost driving, and she has managed to stay on top of her bills and medications. She denies any difficulties as well. She denies leaving the stove on or faucet running. There is no family history of dementia, she denies any history of significant head injuries, she rarely drinks alcohol.  She denies any headaches, dizziness, vision changes, focal numbness/tingling/weakness, neck/back pain, bowel/bladder dysfunction, anosmia, or tremors. Sleep has always been a problem. No wandering behavior, paranoia, or hallucinations. Dempsey denies any staring/unresponsive episodes. She denies any olfactory/gustatory hallucinations, rising epigastric sensation, myoclonic jerks. She has a daughter living in [place] who she sees once a week.   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  Diagnostic Data: Neuropsychological testing in December 2022  indicated Mild Neurocognitive Disorder as they continued to report completing complex tasks independently. Compared to prior  testing, there was decline noted across encoding aspects of memory, cognitive flexibility, and semantic fluency. Consistent decline was also observed across attention/concentration. Etiology remains concerning for Alzheimer's disease.   Brain MRI without contrast done 01/2022: no acute changes. 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 1993; Adenocarcinoma   History of malignant neoplasm of ovary    1998 and 2004   Hypokalemia 01/17/2021   Hypothyroidism 05/11/2011   Peripheral neuropathy 09/06/2012   Syncopal episodes 08/04/2018   Vasomotor instability 09/16/2014   Vitamin D  deficiency     MEDICATIONS: Current Outpatient Medications on File Prior to Visit  Medication Sig Dispense Refill   Aspirin-Acetaminophen -Caffeine (EXCEDRIN MIGRAINE PO) Take 1 tablet by mouth daily as needed (headache).     atorvastatin  (LIPITOR) 10 MG tablet Take 1 tablet (10 mg total) by mouth daily. (Patient not taking: Reported on 03/01/2023) 90 tablet 3   donepezil  (ARICEPT ) 10 MG tablet Take 1 tablet (10 mg total) by mouth at bedtime. 90 tablet 0   estradiol  (ESTRACE ) 1 MG tablet Take 1 tablet (1 mg total) by mouth daily. 30 tablet 1   levothyroxine  (SYNTHROID ) 50 MCG tablet Take 1 tablet (50 mcg total) by mouth daily. 90 tablet 0   memantine  (NAMENDA ) 10 MG tablet TAKE 1 TABLET TWICE A DAY 180 tablet 0   Multiple Vitamin (MULTIVITAMIN WITH MINERALS) TABS tablet Take 1 tablet by mouth at bedtime.     No current facility-administered  medications on file prior to visit.    ALLERGIES: Allergies  Allergen Reactions   Codeine Sulfate Nausea And Vomiting    Projectile vomiting   Promethazine Hcl Other (See Comments)    Unknown reaction    FAMILY HISTORY: Family History  Problem Relation Age of Onset   Cancer Mother        ovarian; history of melanoma    Aneurysm Father        abdominal aortic   Heart disease Father    Diverticulitis Brother    Breast cancer Maternal Aunt     SOCIAL HISTORY: Social History   Socioeconomic History   Marital status: Single    Spouse name: Not on file   Number of children: 2   Years of education: 18   Highest education level: Master's degree (e.g., MA, MS, MEng, MEd, MSW, MBA)  Occupational History   Occupation: Retired  Tobacco Use   Smoking status: Former    Current packs/day: 0.00    Types: Cigarettes    Quit date: 05/26/1991    Years since quitting: 32.7   Smokeless tobacco: Never  Vaping Use   Vaping status: Never Used  Substance and Sexual Activity   Alcohol use: Yes    Comment: occasional wine   Drug use: No   Sexual activity: Not on file  Other Topics Concern   Not on file  Social History Narrative   March 1998- history of stage III ovarian cancer; status  post suboptimal debulking; status post 6 cycles of Taxoil and carboplatin with complete remission documented by second look surgery September 1998.      Recurrent ovarian cancer, status post 6 cycles carbo and Taxol, April 1998 until July 2008      1992, left breast intraductal carcinoma (DCIS)   Status post left modified radial mastectomy and with silicon implant   13 of 13 lymph modes negative; positive for BCRA1 gene   Recurrent ovarian carinoma, February 2008   Admit Feb. 2009 partial small bowel obstruction treated medically      Right handed    Social Drivers of Health   Financial Resource Strain: Low Risk  (09/28/2022)   Overall Financial Resource Strain (CARDIA)    Difficulty of Paying  Living Expenses: Not hard at all  Food Insecurity: No Food Insecurity (09/28/2022)   Hunger Vital Sign    Worried About Running Out of Food in the Last Year: Never true    Ran Out of Food in the Last Year: Never true  Transportation Needs: No Transportation Needs (09/28/2022)   PRAPARE - Administrator, Civil Service (Medical): No    Lack of Transportation (Non-Medical): No  Physical Activity: Inactive (09/28/2022)   Exercise Vital Sign    Days of Exercise per Week: 0 days    Minutes of Exercise per Session: 0 min  Stress: No Stress Concern Present (09/28/2022)   [employer] of Occupational Health - Occupational Stress Questionnaire    Feeling of Stress : Not at all  Social Connections: Socially Isolated (09/23/2021)   Social Connection and Isolation Panel    Frequency of Communication with Friends and Family: More than three times a week    Frequency of Social Gatherings with Friends and Family: More than three times a week    Attends Religious Services: Never    Database administrator or Organizations: No    Attends Banker Meetings: Never    Marital Status: Divorced  Catering manager Violence: Not At Risk (09/23/2021)   Humiliation, Afraid, Rape, and Kick questionnaire    Fear of Current or Ex-Partner: No    Emotionally Abused: No    Physically Abused: No    Sexually Abused: No     PHYSICAL EXAM: Vitals:   01/31/24 1507  BP: 115/70  Pulse: 87  SpO2: 95%   General: No acute distress Head:  Normocephalic/atraumatic Skin/Extremities: No rash, no edema Neurological Exam: alert and awake. No aphasia or dysarthria. Fund of knowledge is appropriate.  Attention and concentration are normal.   Cranial nerves: Pupils equal, round. Extraocular movements intact with no nystagmus. Visual fields full.  No facial asymmetry.  Motor: Bulk and tone normal, muscle strength 5/5 throughout with no pronator drift.   Finger to nose testing intact.  Gait narrow-based and  steady, able to tandem walk adequately.  Romberg negative.   IMPRESSION: This is an 83 yo RH woman with a history of hypothyroidism, breast cancer, ovarian cancer, with recent Neuropsychological testing showing progression to dementia, likely Alzheimer's disease. She is needing more assistance with managing finances. There is concern regarding medication compliance. We discussed looking into an automated pillbox with alarm. Continue Donepezil  10mg  daily and Memantine  10mg  BID. No behavioral changes. She does not drive. They are looking into ALF. Follow-up in 6 months, call for any changes.   Thank you for allowing me to participate in her care.  Please do not hesitate to call for any questions or concerns.  Darice Shivers, M.D.   CC: Dr. Berneta

## 2024-02-25 ENCOUNTER — Ambulatory Visit

## 2024-02-25 DIAGNOSIS — Z0000 Encounter for general adult medical examination without abnormal findings: Secondary | ICD-10-CM | POA: Diagnosis not present

## 2024-02-25 NOTE — Progress Notes (Signed)
 Subjective:   [name] is a 83 y.o. who presents for a Medicare Wellness preventive visit.  As a reminder, Annual Wellness Visits don't include a physical exam, and some assessments may be limited, especially if this visit is performed virtually. We may recommend an in-person follow-up visit with your provider if needed.  Visit Complete: Virtual I connected with  [name] on 02/25/24 by a audio enabled telemedicine application and verified that I am speaking with the correct person using two identifiers.  Patient Location: Home  Provider Location: Office/Clinic  I discussed the limitations of evaluation and management by telemedicine. The patient expressed understanding and agreed to proceed.  Vital Signs: Because this visit was a virtual/telehealth visit, some criteria may be missing or patient reported. Any vitals not documented were not able to be obtained and vitals that have been documented are patient reported.  VideoError- Librarian, academic were attempted between this provider and patient, however failed, due to patient having technical difficulties OR patient did not have access to video capability.  We continued and completed visit with audio only.   Persons Participating in Visit: Patient.  AWV Questionnaire: Yes: Patient Medicare AWV questionnaire was completed by the patient on 02/25/2024; I have confirmed that all information answered by patient is correct and [name] changes since this date.  Cardiac Risk Factors include: advanced age (>13men, >86 women)     Objective:    Today's Vitals   There is [name] height or weight on file to calculate BMI.     02/25/2024    2:19 PM 03/01/2023    2:13 PM 09/28/2022    3:23 PM 08/12/2022    3:01 PM 01/20/2022    3:24 PM 09/23/2021    4:22 PM 07/22/2021    3:34 PM  Advanced Directives  Does Patient Have a Medical Advance Directive? [name] [name] [name] [name] [name] [name] [name]  Would patient like information on  creating a medical advance directive?      [name] - Patient declined     Current Medications (verified) Outpatient Encounter Medications as of 02/25/2024  Medication Sig   Aspirin-Acetaminophen -Caffeine (EXCEDRIN MIGRAINE PO) Take 1 tablet by mouth daily as needed (headache).   donepezil  (ARICEPT ) 10 MG tablet Take 1 tablet (10 mg total) by mouth at bedtime.   estradiol  (ESTRACE ) 1 MG tablet Take 1 tablet (1 mg total) by mouth daily.   levothyroxine  (SYNTHROID ) 50 MCG tablet Take 1 tablet (50 mcg total) by mouth daily.   memantine  (NAMENDA ) 10 MG tablet TAKE 1 TABLET TWICE A DAY   Multiple Vitamin (MULTIVITAMIN WITH MINERALS) TABS tablet Take 1 tablet by mouth at bedtime.   atorvastatin  (LIPITOR) 10 MG tablet Take 1 tablet (10 mg total) by mouth daily. (Patient not taking: Reported on 02/25/2024)   [name] facility-administered encounter medications on file as of 02/25/2024.    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 1993; Adenocarcinoma   History of malignant neoplasm of ovary    1998 and 2004   Hypokalemia 01/17/2021   Hypothyroidism 05/11/2011   Peripheral neuropathy  09/06/2012   Syncopal episodes 08/04/2018   Vasomotor instability 09/16/2014   Vitamin D  deficiency    Past Surgical History:  Procedure Laterality Date   BIOPSY THYROID      for benign disease   BREAST SURGERY     CHOLECYSTECTOMY     COLON SURGERY     colectomy for cancer   colonscopy  2004   2/08   left ankle fracture     status post surgery, March 2010   MASTECTOMY  1992   left mastectomy    PORT-A-CATH REMOVAL Right 04/15/2015   Procedure: REMOVAL PORT-A-CATH;  Surgeon: Krystal Russell, MD;  Location: [facility];  Service: General;  Laterality: Right;  Right upper chest   TOTAL ABDOMINAL HYSTERECTOMY W/ BILATERAL SALPINGOOPHORECTOMY  1998   TAH for ovarian cancer   Family History  Problem Relation Age of Onset   Cancer Mother        ovarian; history of melanoma    Aneurysm Father        abdominal aortic   Heart disease Father    Diverticulitis Brother    Breast cancer Maternal Aunt    Social History   Socioeconomic History   Marital status: Single    Spouse name: Not on file   Number of children: 2   Years of education: 18   Highest education level: Master's degree (e.g., MA, MS, MEng, MEd, MSW, MBA)  Occupational History   Occupation: Retired  Tobacco Use   Smoking status: Former    Current packs/day: 0.00    Types: Cigarettes    Quit date: 05/26/1991    Years since quitting: 32.7   Smokeless tobacco: Never  Vaping Use   Vaping status: Never Used  Substance and Sexual Activity   Alcohol use: Yes    Comment: occasional wine   Drug use: [name]   Sexual activity: Not on file  Other Topics Concern   Not on file  Social History Narrative   March 1998- history of stage III ovarian cancer; status post suboptimal debulking; status post 6 cycles of Taxoil and carboplatin with complete remission documented by second look surgery September 1998.      Recurrent ovarian cancer, status post 6 cycles carbo and Taxol, April 1998 until July 2008      1992, left breast intraductal carcinoma (DCIS)   Status post left modified radial mastectomy and with silicon implant   13 of 13 lymph modes negative; positive for BCRA1 gene   Recurrent ovarian carinoma, February 2008   Admit Feb. 2009 partial small bowel obstruction treated medically      Right handed    Social Drivers of Health   Financial Resource Strain: Low Risk  (02/25/2024)   Overall Financial  Resource Strain (CARDIA)    Difficulty of Paying Living Expenses: Not hard at all  Food Insecurity: [name] Food Insecurity (02/25/2024)   Hunger Vital Sign    Worried About Running Out of Food in the Last Year: Never true    Ran Out of Food in the Last Year: Never true  Transportation Needs: [name] Transportation Needs (02/25/2024)   PRAPARE - Administrator, Civil Service (Medical): [name]    Lack of Transportation (Non-Medical): [name]  Physical Activity: Inactive (02/25/2024)   Exercise Vital Sign    Days of Exercise per Week: 0 days    Minutes of Exercise per Session: 0 min  Stress: [name] Stress Concern Present (02/25/2024)   [employer] of Occupational Health - Occupational Stress  Questionnaire    Feeling of Stress: Not at all  Social Connections: Socially Isolated (02/25/2024)   Social Connection and Isolation Panel    Frequency of Communication with Friends and Family: More than three times a week    Frequency of Social Gatherings with Friends and Family: Once a week    Attends Religious Services: Never    Database administrator or Organizations: [name]    Attends Engineer, structural: Not on file    Marital Status: Divorced    Tobacco Counseling Counseling given: Not Answered    Clinical Intake:  Pre-visit preparation completed: Yes  Pain : [name]/denies pain     Nutritional Risks: None Diabetes: [name]  Lab Results  Component Value Date   HGBA1C 5.0 01/17/2021     How often do you need to have someone help you when you read instructions, pamphlets, or other written materials from your doctor or pharmacy?: 1 - Never  Interpreter Needed?: [name]  Information entered by :: NAllen LPN   Activities of Daily Living     02/25/2024   12:57 PM  In your present state of health, do you have any difficulty performing the following activities:  Hearing? 0  Vision? 0  Difficulty concentrating or making decisions? 1  Walking or climbing stairs? 0  Dressing or bathing? 0   Doing errands, shopping? 1  Preparing Food and eating ? N  Using the Toilet? N  In the past six months, have you accidently leaked urine? N  Do you have problems with loss of bowel control? N  Managing your Medications? N  Managing your Finances? N  Housekeeping or managing your Housekeeping? N    Patient Care Team: Berneta Elsie Sayre, MD as PCP - General (Family Medicine) Georjean Darice HERO, MD as Consulting Physician (Neurology) Dina Sara E, PA-C (Neurology)  I have updated your Care Teams any recent Medical Services you may have received from other providers in the past year.     Assessment:   This is a routine wellness examination for [name].  Hearing/Vision screen Hearing Screening - Comments:: Denies hearing issues Vision Screening - Comments:: [name] regular eye exams   Goals Addressed             This Visit's Progress    Patient Stated       02/25/2024, denies goals       Depression Screen     02/25/2024    2:21 PM 09/28/2022    3:23 PM 01/29/2022    1:55 PM 09/23/2021    4:16 PM 07/28/2021    3:01 PM 03/12/2021    2:20 PM 01/17/2021    4:48 PM  PHQ 2/9 Scores  PHQ - 2 Score 0 0 0 0 0 2 3  PHQ- 9 Score 0     5 8    Fall Risk     02/25/2024   12:57 PM 03/01/2023    2:13 PM 09/28/2022    3:23 PM 08/12/2022    3:01 PM 01/29/2022    1:56 PM  Fall Risk   Falls in the past year? 0 0 0 0 0  Number falls in past yr: 0 0 0 0 0  Injury with Fall? 0 0 0 0   Risk for fall due to : Medication side effect  Medication side effect    Follow up Falls prevention discussed;Falls evaluation completed Falls evaluation completed Falls prevention discussed;Education provided;Falls evaluation completed Falls evaluation completed     MEDICARE RISK  AT HOME:  Medicare Risk at Home Any stairs in or around the home?: (Patient-Rptd) Yes If so, are there any without handrails?: (Patient-Rptd) [name] Home free of loose throw rugs in walkways, pet beds, electrical cords, etc?:  (Patient-Rptd) Yes Adequate lighting in your home to reduce risk of falls?: (Patient-Rptd) Yes Life alert?: (Patient-Rptd) [name] Use of a cane, walker or w/c?: (Patient-Rptd) [name] Grab bars in the bathroom?: (Patient-Rptd) [name] Shower chair or bench in shower?: (Patient-Rptd) [name] Elevated toilet seat or a handicapped toilet?: (Patient-Rptd) [name]  TIMED UP AND GO:  Was the test performed?  [name]  Cognitive Function: Impaired: Patient has current diagnosis of cognitive impairment. Followed by neurology     03/01/2023    2:00 PM 01/20/2022    3:00 PM  MMSE - Mini Mental State Exam  Orientation to time 0 1  Orientation to Place 5 3  Registration 3 3  Attention/ Calculation 5 5  Recall 0 0  Language- name 2 objects 2 2  Language- repeat 1 1  Language- follow 3 step command 3 3  Language- read & follow direction 1 1  Write a sentence 1 1  Copy design 1 0  Total score 22 20      11/06/2020    1:00 PM 11/01/2018   10:00 AM  Montreal Cognitive Assessment   Visuospatial/ Executive (0/5) 4   Naming (0/3) 3   Attention: Read list of digits (0/2) 2 2  Attention: Read list of letters (0/1) 1 0  Attention: Serial 7 subtraction starting at 100 (0/3) 2 3  Language: Repeat phrase (0/2) 2 2  Language : Fluency (0/1) 1 0  Abstraction (0/2) 2 2  Delayed Recall (0/5) 0 0  Orientation (0/6) 2 3  Total 19   Adjusted Score (based on education) 19       09/23/2021    4:23 PM 11/29/2019    2:10 PM  6CIT Screen  What Year? 0 points 0 points  What month? 3 points 3 points  What time? 0 points 3 points  Count back from 20 0 points 0 points  Months in reverse 0 points 2 points  Repeat phrase 10 points 6 points  Total Score 13 points 14 points    Immunizations Immunization History  Administered Date(s) Administered   Influenza Whole 02/21/2021   PFIZER(Purple Top)SARS-COV-2 Vaccination 07/06/2019, 07/31/2019, 10/17/2020   Pneumococcal Conjugate-13 09/22/2017   Tdap 06/27/2012   Zoster  Recombinant(Shingrix) 10/17/2020, 12/06/2020   Zoster, Live 03/28/2008    Screening Tests Health Maintenance  Topic Date Due   Mammogram  03/26/2022   DTaP/Tdap/Td (2 - Td or Tdap) 06/27/2022   Medicare Annual Wellness (AWV)  02/24/2025   DEXA SCAN  Completed   Zoster Vaccines- Shingrix  Completed   HPV VACCINES  Aged Out   Meningococcal B Vaccine  Aged Out   Pneumococcal Vaccine: 50+ Years  Discontinued   Influenza Vaccine  Discontinued   COVID-19 Vaccine  Discontinued    Health Maintenance Items Addressed: Mammogram not needed. Due for TDAP  Additional Screening:  Vision Screening: Recommended annual ophthalmology exams for early detection of glaucoma and other disorders of the eye. Is the patient up to date with their annual eye exam?  [name]  Who is the provider or what is the name of the office in which the patient attends annual eye exams? none  Dental Screening: Recommended annual dental exams for proper oral hygiene  Community Resource Referral / Chronic Care Management: CRR required this visit?  [name]   CCM required this visit?  [name]   Plan:    I have personally reviewed and noted the following in the patient's chart:   Medical and social history Use of alcohol, tobacco or illicit drugs  Current medications and supplements including opioid prescriptions. Patient is not currently taking opioid prescriptions. Functional ability and status Nutritional status Physical activity Advanced directives List of other physicians Hospitalizations, surgeries, and ER visits in previous 12 months Vitals Screenings to include cognitive, depression, and falls Referrals and appointments  In addition, I have reviewed and discussed with patient certain preventive protocols, quality metrics, and best practice recommendations. A written personalized care plan for preventive services as well as general preventive health recommendations were provided to patient.   Ardella FORBES Dawn,  LPN   [id number]   After Visit Summary: (MyChart) Due to this being a telephonic visit, the after visit summary with patients personalized plan was offered to patient via MyChart   Notes: Nothing significant to report at this time.

## 2024-02-25 NOTE — Patient Instructions (Signed)
 Ms. Rothert,  Thank you for taking the time for your Medicare Wellness Visit. I appreciate your continued commitment to your health goals. Please review the care plan we discussed, and feel free to reach out if I can assist you further.  Medicare recommends these wellness visits once per year to help you and your care team stay ahead of potential health issues. These visits are designed to focus on prevention, allowing your provider to concentrate on managing your acute and chronic conditions during your regular appointments.  Please note that Annual Wellness Visits do not include a physical exam. Some assessments may be limited, especially if the visit was conducted virtually. If needed, we may recommend a separate in-person follow-up with your provider.  Ongoing Care Seeing your primary care provider every 3 to 6 months helps us  monitor your health and provide consistent, personalized care.   Referrals If a referral was made during today's visit and you haven't received any updates within two weeks, please contact the referred provider directly to check on the status.  Recommended Screenings:  Health Maintenance  Topic Date Due   Breast Cancer Screening  03/26/2022   DTaP/Tdap/Td vaccine (2 - Td or Tdap) 06/27/2022   Medicare Annual Wellness Visit  02/24/2025   DEXA scan (bone density measurement)  Completed   Zoster (Shingles) Vaccine  Completed   HPV Vaccine  Aged Out   Meningitis B Vaccine  Aged Out   Pneumococcal Vaccine for age over 53  Discontinued   Flu Shot  Discontinued   COVID-19 Vaccine  Discontinued       02/25/2024    2:19 PM  Advanced Directives  Does Patient Have a Medical Advance Directive? No   Advance Care Planning is important because it: Ensures you receive medical care that aligns with your values, goals, and preferences. Provides guidance to your family and loved ones, reducing the emotional burden of decision-making during critical moments.  Vision:  Annual vision screenings are recommended for early detection of glaucoma, cataracts, and diabetic retinopathy. These exams can also reveal signs of chronic conditions such as diabetes and high blood pressure.  Dental: Annual dental screenings help detect early signs of oral cancer, gum disease, and other conditions linked to overall health, including heart disease and diabetes.  Please see the attached documents for additional preventive care recommendations.

## 2024-03-30 ENCOUNTER — Encounter: Payer: Self-pay | Admitting: Family Medicine

## 2024-03-30 ENCOUNTER — Ambulatory Visit: Admitting: Family Medicine

## 2024-03-30 ENCOUNTER — Ambulatory Visit: Payer: Self-pay | Admitting: Family Medicine

## 2024-03-30 VITALS — BP 102/68 | HR 56 | Temp 98.9°F | Ht 68.0 in | Wt 130.8 lb

## 2024-03-30 DIAGNOSIS — F0280 Dementia in other diseases classified elsewhere without behavioral disturbance: Secondary | ICD-10-CM

## 2024-03-30 DIAGNOSIS — E785 Hyperlipidemia, unspecified: Secondary | ICD-10-CM | POA: Diagnosis not present

## 2024-03-30 DIAGNOSIS — G309 Alzheimer's disease, unspecified: Secondary | ICD-10-CM

## 2024-03-30 DIAGNOSIS — Z131 Encounter for screening for diabetes mellitus: Secondary | ICD-10-CM

## 2024-03-30 DIAGNOSIS — E039 Hypothyroidism, unspecified: Secondary | ICD-10-CM

## 2024-03-30 LAB — COMPREHENSIVE METABOLIC PANEL WITH GFR
ALT: 10 U/L (ref 0–35)
AST: 10 U/L (ref 0–37)
Albumin: 4 g/dL (ref 3.5–5.2)
Alkaline Phosphatase: 115 U/L (ref 39–117)
BUN: 11 mg/dL (ref 6–23)
CO2: 31 meq/L (ref 19–32)
Calcium: 9.5 mg/dL (ref 8.4–10.5)
Chloride: 103 meq/L (ref 96–112)
Creatinine, Ser: 0.86 mg/dL (ref 0.40–1.20)
GFR: 61.94 mL/min (ref >60.00)
Glucose, Bld: 72 mg/dL (ref 70–99)
Potassium: 3.6 meq/L (ref 3.5–5.1)
Sodium: 142 meq/L (ref 135–145)
Total Bilirubin: 0.5 mg/dL (ref 0.2–1.2)
Total Protein: 6.8 g/dL (ref 6.0–8.3)

## 2024-03-30 LAB — CBC
HCT: 46 % (ref 36.0–46.0)
Hemoglobin: 15.3 g/dL — ABNORMAL HIGH (ref 12.0–15.0)
MCHC: 33.2 g/dL (ref 30.0–36.0)
MCV: 91.9 fl (ref 78.0–100.0)
Platelets: 211 K/uL (ref 150.0–400.0)
RBC: 5 Mil/uL (ref 3.87–5.11)
RDW: 13.3 % (ref 11.5–15.5)
WBC: 5.6 K/uL (ref 4.0–10.5)

## 2024-03-30 LAB — HEMOGLOBIN A1C: Hgb A1c MFr Bld: 5.4 % (ref 4.6–6.5)

## 2024-03-30 LAB — TSH: TSH: 8.2 u[IU]/mL — ABNORMAL HIGH (ref 0.35–5.50)

## 2024-03-30 LAB — LIPID PANEL
Cholesterol: 238 mg/dL — ABNORMAL HIGH (ref 0–200)
HDL: 51.2 mg/dL (ref >39.00)
LDL Cholesterol: 150 mg/dL — ABNORMAL HIGH (ref 0–99)
NonHDL: 187.11
Total CHOL/HDL Ratio: 5
Triglycerides: 185 mg/dL — ABNORMAL HIGH (ref 0.0–149.0)
VLDL: 37 mg/dL (ref 0.0–40.0)

## 2024-03-30 MED ORDER — ATORVASTATIN CALCIUM 10 MG PO TABS: 10.0000 mg | ORAL_TABLET | Freq: Every day | ORAL | 3 refills | Status: AC

## 2024-03-30 MED ORDER — LEVOTHYROXINE SODIUM 50 MCG PO TABS: 50.0000 ug | ORAL_TABLET | Freq: Every day | ORAL | 0 refills | Status: AC

## 2024-03-30 NOTE — Progress Notes (Addendum)
 Established Patient Office Visit   Subjective:  Patient ID: [name], female    DOB: 18-Apr-1941  Age: 83 y.o. MRN: [id number]  Chief Complaint  Patient presents with   Medical Management of Chronic Issues    Pt has not been seen in over 2 years. Pt is having trouble remembering things per Manus (significant other) Confused on meds.     HPI Encounter Diagnoses  Name Primary?   Acquired hypothyroidism Yes   Dyslipidemia    Dementia due to Alzheimer's disease    Screening for diabetes mellitus    Here today with her significant other, Dempsey.  Has been lost to follow-up for the last 3 years.  She has been out of the levothyroxine  and atorvastatin .  Continues follow-up with neurology for Alzheimer's dementia.   Review of Systems  Constitutional: Negative.   HENT: Negative.    Eyes:  Negative for blurred vision, discharge and redness.  Respiratory: Negative.    Cardiovascular: Negative.   Gastrointestinal:  Negative for abdominal pain.  Genitourinary: Negative.   Musculoskeletal: Negative.  Negative for myalgias.  Skin:  Negative for rash.  Neurological:  Negative for tingling, loss of consciousness and weakness.  Endo/Heme/Allergies:  Negative for polydipsia.      03/30/2024    1:27 PM 03/30/2024    1:26 PM 02/25/2024    2:21 PM  Depression screen PHQ 2/9  Decreased Interest 0 0 0  Down, Depressed, Hopeless 0 0 0  PHQ - 2 Score 0 0 0  Altered sleeping 0 0 0  Tired, decreased energy  0 0  Change in appetite  0 0  Feeling bad or failure about yourself   0 0  Trouble concentrating  0 0  Moving slowly or fidgety/restless  1 0  Suicidal thoughts  0 0  PHQ-9 Score 0 1 0   Difficult doing work/chores  Not difficult at all Not difficult at all     Data saved with a previous flowsheet row definition      Current Outpatient Medications:    Aspirin-Acetaminophen -Caffeine (EXCEDRIN MIGRAINE PO), Take 1 tablet by mouth daily as needed (headache)., Disp: , Rfl:     donepezil  (ARICEPT ) 10 MG tablet, Take 1 tablet (10 mg total) by mouth at bedtime., Disp: 90 tablet, Rfl: 4   memantine  (NAMENDA ) 10 MG tablet, TAKE 1 TABLET TWICE A DAY, Disp: 180 tablet, Rfl: 4   Multiple Vitamin (MULTIVITAMIN WITH MINERALS) TABS tablet, Take 1 tablet by mouth at bedtime., Disp: , Rfl:    atorvastatin  (LIPITOR) 10 MG tablet, Take 1 tablet (10 mg total) by mouth daily., Disp: 90 tablet, Rfl: 3   levothyroxine  (SYNTHROID ) 50 MCG tablet, Take 1 tablet (50 mcg total) by mouth daily., Disp: 90 tablet, Rfl: 0   Objective:     BP 102/68 (BP Location: Right Arm, Patient Position: Sitting, Cuff Size: Normal)   Pulse (!) 56   Temp 98.9 F (37.2 C) (Temporal)   Ht 5' 8 (1.727 m)   Wt 130 lb 12.8 oz (59.3 kg)   SpO2 96%   BMI 19.89 kg/m  Wt Readings from Last 3 Encounters:  03/30/24 130 lb 12.8 oz (59.3 kg)  01/31/24 134 lb (60.8 kg)  03/01/23 142 lb 9.6 oz (64.7 kg)      Physical Exam Constitutional:      General: She is not in acute distress.    Appearance: Normal appearance. She is not ill-appearing, toxic-appearing or diaphoretic.  HENT:     Head:  Normocephalic and atraumatic.     Right Ear: External ear normal.     Left Ear: External ear normal.     Mouth/Throat:     Mouth: Mucous membranes are moist.     Pharynx: Oropharynx is clear. No oropharyngeal exudate or posterior oropharyngeal erythema.  Eyes:     General: No scleral icterus.       Right eye: No discharge.        Left eye: No discharge.     Extraocular Movements: Extraocular movements intact.     Conjunctiva/sclera: Conjunctivae normal.     Pupils: Pupils are equal, round, and reactive to light.  Cardiovascular:     Rate and Rhythm: Normal rate and regular rhythm.  Pulmonary:     Effort: Pulmonary effort is normal. No respiratory distress.     Breath sounds: Normal breath sounds. No wheezing or rales.  Abdominal:     General: Bowel sounds are normal.  Musculoskeletal:     Cervical back: No  rigidity or tenderness.  Lymphadenopathy:     Cervical: No cervical adenopathy.  Skin:    General: Skin is warm and dry.  Neurological:     Mental Status: She is alert and oriented to person, place, and time.  Psychiatric:        Mood and Affect: Mood normal.        Behavior: Behavior normal.      Results for orders placed or performed in visit on 03/30/24  CBC  Result Value Ref Range   WBC 5.6 4.0 - 10.5 K/uL   RBC 5.00 3.87 - 5.11 Mil/uL   Platelets 211.0 150.0 - 400.0 K/uL   Hemoglobin 15.3 (H) 12.0 - 15.0 g/dL   HCT 53.9 63.9 - 53.9 %   MCV 91.9 78.0 - 100.0 fl   MCHC 33.2 30.0 - 36.0 g/dL   RDW 86.6 88.4 - 84.4 %  Comprehensive metabolic panel with GFR  Result Value Ref Range   Sodium 142 135 - 145 mEq/L   Potassium 3.6 3.5 - 5.1 mEq/L   Chloride 103 96 - 112 mEq/L   CO2 31 19 - 32 mEq/L   Glucose, Bld 72 70 - 99 mg/dL   BUN 11 6 - 23 mg/dL   Creatinine, Ser 9.13 0.40 - 1.20 mg/dL   Total Bilirubin 0.5 0.2 - 1.2 mg/dL   Alkaline Phosphatase 115 39 - 117 U/L   AST 10 0 - 37 U/L   ALT 10 0 - 35 U/L   Total Protein 6.8 6.0 - 8.3 g/dL   Albumin 4.0 3.5 - 5.2 g/dL   GFR 38.05 >39.99 mL/min   Calcium  9.5 8.4 - 10.5 mg/dL  Lipid panel  Result Value Ref Range   Cholesterol 238 (H) 0 - 200 mg/dL   Triglycerides 814.9 (H) 0.0 - 149.0 mg/dL   HDL 48.79 >60.99 mg/dL   VLDL 62.9 0.0 - 59.9 mg/dL   LDL Cholesterol 849 (H) 0 - 99 mg/dL   Total CHOL/HDL Ratio 5    NonHDL 187.11   TSH  Result Value Ref Range   TSH 8.20 (H) 0.35 - 5.50 uIU/mL  Hemoglobin A1c  Result Value Ref Range   Hgb A1c MFr Bld 5.4 4.6 - 6.5 %      The ASCVD Risk score (Arnett DK, et al., 2019) failed to calculate for the following reasons:   The 2019 ASCVD risk score is only valid for ages 57 to 60    Assessment & Plan:  Acquired hypothyroidism -     TSH -     Levothyroxine  Sodium; Take 1 tablet (50 mcg total) by mouth daily.  Dispense: 90 tablet; Refill: 0  Dyslipidemia -      Comprehensive metabolic panel with GFR -     Lipid panel -     Atorvastatin  Calcium ; Take 1 tablet (10 mg total) by mouth daily.  Dispense: 90 tablet; Refill: 3  Dementia due to Alzheimer's disease -     CBC  Screening for diabetes mellitus -     Comprehensive metabolic panel with GFR -     Hemoglobin A1c    Return in about 3 months (around 06/30/2024).  Will likely restart levothyroxine  and atorvastatin  after review of today's blood work.  Explained that levothyroxine  is a lifetime medication and needs to be taken on a fasting stomach.  She does use a pillbox.  Dempsey will be helping.  Elsie Sim Lent, MD

## 2024-03-30 NOTE — Addendum Note (Signed)
 Addended by: BERNETA ELSIE LABOR on: 03/30/2024 05:04 PM   Modules accepted: Orders

## 2024-04-01 ENCOUNTER — Encounter: Payer: Self-pay | Admitting: Family Medicine

## 2024-04-03 NOTE — Telephone Encounter (Signed)
 Noted

## 2024-04-25 ENCOUNTER — Encounter: Payer: Self-pay | Admitting: Neurology

## 2024-06-05 ENCOUNTER — Encounter (HOSPITAL_BASED_OUTPATIENT_CLINIC_OR_DEPARTMENT_OTHER): Payer: Self-pay | Admitting: Emergency Medicine

## 2024-06-05 ENCOUNTER — Other Ambulatory Visit: Payer: Self-pay

## 2024-06-05 ENCOUNTER — Ambulatory Visit: Payer: Self-pay

## 2024-06-05 ENCOUNTER — Emergency Department (HOSPITAL_BASED_OUTPATIENT_CLINIC_OR_DEPARTMENT_OTHER)
Admission: EM | Admit: 2024-06-05 | Discharge: 2024-06-05 | Disposition: A | Discharge status: Home / Self Care | Attending: Emergency Medicine | Admitting: Emergency Medicine

## 2024-06-05 DIAGNOSIS — N3001 Acute cystitis with hematuria: Secondary | ICD-10-CM | POA: Diagnosis not present

## 2024-06-05 DIAGNOSIS — R5383 Other fatigue: Secondary | ICD-10-CM | POA: Diagnosis present

## 2024-06-05 DIAGNOSIS — F0390 Unspecified dementia without behavioral disturbance: Secondary | ICD-10-CM | POA: Insufficient documentation

## 2024-06-05 DIAGNOSIS — Z7982 Long term (current) use of aspirin: Secondary | ICD-10-CM | POA: Diagnosis not present

## 2024-06-05 DIAGNOSIS — I4581 Long QT syndrome: Secondary | ICD-10-CM | POA: Diagnosis not present

## 2024-06-05 DIAGNOSIS — E876 Hypokalemia: Secondary | ICD-10-CM | POA: Diagnosis not present

## 2024-06-05 DIAGNOSIS — R9431 Abnormal electrocardiogram [ECG] [EKG]: Secondary | ICD-10-CM

## 2024-06-05 LAB — URINALYSIS, ROUTINE W REFLEX MICROSCOPIC
Bilirubin Urine: NEGATIVE
Glucose, UA: NEGATIVE mg/dL
Ketones, ur: NEGATIVE mg/dL
Nitrite: POSITIVE — AB
Protein, ur: NEGATIVE mg/dL
Specific Gravity, Urine: 1.02 (ref 1.005–1.030)
pH: 5.5 (ref 5.0–8.0)

## 2024-06-05 LAB — URINALYSIS, MICROSCOPIC (REFLEX)

## 2024-06-05 LAB — COMPREHENSIVE METABOLIC PANEL WITH GFR
ALT: 11 U/L (ref 0–44)
AST: 20 U/L (ref 15–41)
Albumin: 4.2 g/dL (ref 3.5–5.0)
Alkaline Phosphatase: 94 U/L (ref 38–126)
Anion gap: 13 (ref 5–15)
BUN: 12 mg/dL (ref 8–23)
CO2: 23 mmol/L (ref 22–32)
Calcium: 9.6 mg/dL (ref 8.9–10.3)
Chloride: 102 mmol/L (ref 98–111)
Creatinine, Ser: 0.99 mg/dL (ref 0.44–1.00)
GFR, Estimated: 56 mL/min — ABNORMAL LOW
Glucose, Bld: 123 mg/dL — ABNORMAL HIGH (ref 70–99)
Potassium: 3.2 mmol/L — ABNORMAL LOW (ref 3.5–5.1)
Sodium: 138 mmol/L (ref 135–145)
Total Bilirubin: 0.7 mg/dL (ref 0.0–1.2)
Total Protein: 7.1 g/dL (ref 6.5–8.1)

## 2024-06-05 LAB — CBC
HCT: 47.7 % — ABNORMAL HIGH (ref 36.0–46.0)
Hemoglobin: 15.9 g/dL — ABNORMAL HIGH (ref 12.0–15.0)
MCH: 30.5 pg (ref 26.0–34.0)
MCHC: 33.3 g/dL (ref 30.0–36.0)
MCV: 91.4 fL (ref 80.0–100.0)
Platelets: 223 K/uL (ref 150–400)
RBC: 5.22 MIL/uL — ABNORMAL HIGH (ref 3.87–5.11)
RDW: 12.3 % (ref 11.5–15.5)
WBC: 6.8 K/uL (ref 4.0–10.5)
nRBC: 0 % (ref 0.0–0.2)

## 2024-06-05 LAB — MAGNESIUM: Magnesium: 2.3 mg/dL (ref 1.7–2.4)

## 2024-06-05 LAB — CBG MONITORING, ED: Glucose-Capillary: 152 mg/dL — ABNORMAL HIGH (ref 70–99)

## 2024-06-05 MED ORDER — POTASSIUM CHLORIDE CRYS ER 20 MEQ PO TBCR
40.0000 meq | EXTENDED_RELEASE_TABLET | Freq: Once | ORAL | Status: AC
  Administered 2024-06-05: 40 meq via ORAL
  Filled 2024-06-05: qty 2

## 2024-06-05 MED ORDER — CEPHALEXIN 500 MG PO CAPS: 500.0000 mg | ORAL_CAPSULE | Freq: Two times a day (BID) | ORAL | 0 refills | Status: AC

## 2024-06-05 NOTE — ED Triage Notes (Signed)
 Pt with family- daugther reports pt with increased sleeping x 1 week. Reports she will sleep appx 18-20 hours per day.  Reports good po intake, though pt c/o nausea after eating banana this AM.  Denies emesis, known fever, known sick contacts.   When asked, pt reports I feel yucky. Denies specific pain.

## 2024-06-05 NOTE — Discharge Instructions (Addendum)
 EKG did not show acute signs of heart attack, did show prolonged conduction which will need to be rechecked with your primary care provider.  Urinalysis was positive for urinary tract infection which is likely contributing to your overall fatigue.  Remainder of your evaluation was overall reassuring.  You have been prescribed antibiotics.  Return for any confusion or change in mental status or development of fever, chills, any other significant clinical change in status.

## 2024-06-05 NOTE — Telephone Encounter (Signed)
 FYI Only or Action Required?: FYI only for provider: ED advised.  Patient was last seen in primary care on 03/30/2024 by Berneta Elsie Sayre, MD.  Called Nurse Triage reporting Fatigue.  Symptoms began several days ago.  Interventions attempted: Nothing.  Symptoms are: gradually worsening.  Triage Disposition: Call EMS 911 Now  Patient/caregiver understands and will follow disposition?: Yes   Copied from CRM #[id number]. Topic: Clinical - Red Word Triage >> Jun 05, 2024  8:21 AM Eva FALCON wrote: Red Word that prompted transfer to Nurse Triage: Daughter Eleanor states mom is 75 and has dementia but the last 4-5 days started sleeping 18-20 hours a day, she'll be awake for 4 hours but is very lethargic when she is awake. It is difficult to wake her up. This is not like her and is concerned. Reason for Disposition  Difficult to awaken or acting confused (e.g., disoriented, slurred speech)  Answer Assessment - Initial Assessment Questions 1. DESCRIPTION: Tell me more about how you (your loved one; patient) are feeling. (e.g., tired, exhausted, lacks energy, feels physically weak)      Difficulty to wake   2. SEVERITY: How bad is it?  How is the weakness or fatigue affecting your ability to do your usual activities?       Sleeping 18-20 hours a day  3. ONSET:  When did these symptoms begin? (e.g., hours, days, weeks, months)      X 4-5 days  4. CAUSE: What do you think is causing the weakness or fatigue? (e.g., poor appetite, not drinking enough fluids, trouble sleeping, a new medicine)      unsure  5. OTHER SYMPTOMS: Pt's daughter bleeding, chest pain, diarrhea, fever, cough, SOB, vomiting        6. NEW MEDICINES:  Have you started on any new medicines recently? (e.g., opioid pain medicines, benzodiazepines, muscle relaxants, antidepressants, antihistamines, neuroleptics, beta blockers)      Denies new medication        Pt's daughter reports fatigue Pt's daughter  advised to take patient to ER for further evaluation and treatment due to extreme fatigue Pt's daughter agrees with plan of care, will call back for any worsening symptoms  Protocols used: Weakness (Generalized) and Fatigue-A-AH

## 2024-06-05 NOTE — ED Notes (Signed)
"  Lab notified of magnesium  add-on    "

## 2024-06-05 NOTE — ED Provider Notes (Signed)
 " [facility] Provider Note   CSN: [id number] Arrival date & time: 06/05/24  1301     Patient presents with: Fatigue   [name] is a 84 y.o. female.   HPI   84 year old female with medical history significant for dementia presenting to the emergency department with fatigue.  Patient is been sleeping 18 to 20 hours/day for the past few days.  No focal neurologic deficits.  She has had some nausea earlier this morning, no known fevers, no known sick contacts, no vomiting.  Patient endorses generalized malaise.  No nasal congestion, cough, shortness of breath, chest pain.  Patient is at her baseline mental status, arrives GCS 14, AAO x 2.  Prior to Admission medications  Medication Sig Start Date End Date Taking? Authorizing Provider  cephALEXin  (KEFLEX ) 500 MG capsule Take 1 capsule (500 mg total) by mouth 2 (two) times daily for 5 days. 06/05/24 06/10/24 Yes Jerrol Agent, MD  Aspirin-Acetaminophen -Caffeine (EXCEDRIN MIGRAINE PO) Take 1 tablet by mouth daily as needed (headache).    [provider]  atorvastatin  (LIPITOR) 10 MG tablet Take 1 tablet (10 mg total) by mouth daily. 03/30/24   Berneta Elsie Sayre, MD  donepezil  (ARICEPT ) 10 MG tablet Take 1 tablet (10 mg total) by mouth at bedtime. 01/31/24   Georjean Darice HERO, MD  levothyroxine  (SYNTHROID ) 50 MCG tablet Take 1 tablet (50 mcg total) by mouth daily. 03/30/24   Berneta Elsie Sayre, MD  memantine  (NAMENDA ) 10 MG tablet TAKE 1 TABLET TWICE A DAY 01/31/24   Georjean Darice HERO, MD  Multiple Vitamin (MULTIVITAMIN WITH MINERALS) TABS tablet Take 1 tablet by mouth at bedtime.    [provider]    Allergies: Codeine sulfate and Promethazine hcl    Review of Systems  All other systems reviewed and are negative.   Updated Vital Signs BP (!) 111/57   Pulse 68   Temp 97.6 F (36.4 C) (Oral)   Resp 15   Ht 5' 8 (1.727 m)   SpO2 98%   BMI 19.89 kg/m   Physical  Exam Vitals and nursing note reviewed.  Constitutional:      General: She is not in acute distress.    Appearance: She is well-developed.     Comments: GCS 14, AAOx2  HENT:     Head: Normocephalic and atraumatic.  Eyes:     Conjunctiva/sclera: Conjunctivae normal.  Cardiovascular:     Rate and Rhythm: Normal rate and regular rhythm.  Pulmonary:     Effort: Pulmonary effort is normal. No respiratory distress.     Breath sounds: Normal breath sounds.  Abdominal:     Palpations: Abdomen is soft.     Tenderness: There is no abdominal tenderness.  Musculoskeletal:        General: No swelling.     Cervical back: Neck supple.  Skin:    General: Skin is warm and dry.     Capillary Refill: Capillary refill takes less than 2 seconds.  Neurological:     Mental Status: She is alert.     Comments: MENTAL STATUS EXAM:    Orientation: Alert and oriented to person, place. Disoriented to year which is the patient's baseline Memory: Cooperative, follows commands well.  Language: Speech is clear and language is normal.   CRANIAL NERVES:    CN 2 (Optic): Visual fields intact to confrontation.  CN 3,4,6 (EOM): Pupils equal and reactive to light. Full extraocular eye movement without nystagmus.  CN 5 (Trigeminal): Facial sensation is normal, no weakness of masticatory muscles.  CN 7 (Facial): No facial weakness or asymmetry.  CN 8 (Auditory): Auditory acuity grossly normal.  CN 9,10 (Glossophar): The uvula is midline, the palate elevates symmetrically.  CN 11 (spinal access): Normal sternocleidomastoid and trapezius strength.  CN 12 (Hypoglossal): The tongue is midline. No atrophy or fasciculations.SABRA   MOTOR:  Muscle Strength: 5/5RUE, 5/5LUE, 5/5RLE, 5/5LLE.   COORDINATION:   No tremor.   SENSATION:   Intact to light touch all four extremities.     Psychiatric:        Mood and Affect: Mood normal.     (all labs ordered are listed, but only abnormal results are displayed) Labs  Reviewed  COMPREHENSIVE METABOLIC PANEL WITH GFR - Abnormal; Notable for the following components:      Result Value   Potassium 3.2 (*)    Glucose, Bld 123 (*)    GFR, Estimated 56 (*)    All other components within normal limits  CBC - Abnormal; Notable for the following components:   RBC 5.22 (*)    Hemoglobin 15.9 (*)    HCT 47.7 (*)    All other components within normal limits  URINALYSIS, ROUTINE W REFLEX MICROSCOPIC - Abnormal; Notable for the following components:   APPearance CLOUDY (*)    Hgb urine dipstick TRACE (*)    Nitrite POSITIVE (*)    Leukocytes,Ua SMALL (*)    All other components within normal limits  URINALYSIS, MICROSCOPIC (REFLEX) - Abnormal; Notable for the following components:   Bacteria, UA MANY (*)    All other components within normal limits  CBG MONITORING, ED - Abnormal; Notable for the following components:   Glucose-Capillary 152 (*)    All other components within normal limits  MAGNESIUM    EKG: EKG Interpretation Date/Time:  Monday June 05 2024 13:23:55 EST Ventricular Rate:  73 PR Interval:  134 QRS Duration:  92 QT Interval:  539 QTC Calculation: 595 R Axis:   -4  Text Interpretation: Sinus rhythm Prolonged QT interval Confirmed by Ula Barter [phone]) on 06/05/2024 1:39:57 PM  Radiology: No results found.   Procedures   Medications Ordered in the ED  potassium chloride  SA (KLOR-CON  M) CR tablet 40 mEq (40 mEq Oral Given 06/05/24 1606)                                    Medical Decision Making Amount and/or Complexity of Data Reviewed Labs: ordered.  Risk Prescription drug management.    84 year old female with medical history significant for dementia presenting to the emergency department with fatigue.  Patient is been sleeping 18 to 20 hours/day for the past few days.  No focal neurologic deficits.  She has had some nausea earlier this morning, no known fevers, no known sick contacts, no vomiting.  Patient endorses  generalized malaise.  No nasal congestion, cough, shortness of breath, chest pain.  Patient is at her baseline mental status, arrives GCS 14, AAO x 2.  On arrival, the patient was afebrile, not tachycardic or tachypneic, hemodynamically stable, saturating well on room air.  Patient on exam had an intact neurologic exam, was at her baseline mental status, arrives GCS 14, pleasantly demented, AAO x 2, no focal neurologic deficits noted.  No abdominal tenderness, lungs clear to auscultation bilaterally.  Patient appears well-hydrated.  Workup initiated for vague fatigue.  Laboratory evaluation  performed which revealed mild hypokalemia to 3.2, replenished orally, CBC without anemia or leukocytosis, remainder of CMP unremarkable, CBG 152, urinalysis grossly positive for UTI with positive nitrates, small leukocytes, 6-10 WBCs and many bacteria present.  Patient not meeting SIRS criteria, is vitally stable.  Suspect likely urinary tract infection contributing to patient's presentation.  Neurologically intact and her baseline mental status, I do not see an indication for inpatient hospitalization at this time.  Magnesium checked and normal.  Discussed with family initial treatment for UTI with Keflex .  Return precautions were discussed in the event of any severe worsening symptoms.  Patient EKG showed evidence of sinus rhythm, ventricular rate 73, prolonged QT noted on EKG. No syncope.  Patient tolerating p.o. intake, overall well-appearing at her baseline, stable for discharge and outpatient follow-up with her PCP, return precautions provided.     Final diagnoses:  Fatigue, unspecified type  Acute cystitis with hematuria  Prolonged Q-T interval on ECG    ED Discharge Orders          Ordered    cephALEXin  (KEFLEX ) 500 MG capsule  2 times daily        06/05/24 1547               Jerrol Agent, MD 06/05/24 1612  "

## 2024-06-06 NOTE — Telephone Encounter (Signed)
 Patient went to the ER 06/05/24. Dm/cma

## 2024-06-15 ENCOUNTER — Ambulatory Visit: Admitting: Family Medicine

## 2024-06-15 ENCOUNTER — Encounter: Payer: Self-pay | Admitting: Family Medicine

## 2024-06-15 VITALS — BP 116/70 | HR 71 | Temp 97.9°F | Ht 68.0 in | Wt 125.6 lb

## 2024-06-15 DIAGNOSIS — Z09 Encounter for follow-up examination after completed treatment for conditions other than malignant neoplasm: Secondary | ICD-10-CM | POA: Diagnosis not present

## 2024-06-15 DIAGNOSIS — G309 Alzheimer's disease, unspecified: Secondary | ICD-10-CM | POA: Diagnosis not present

## 2024-06-15 DIAGNOSIS — Z87440 Personal history of urinary (tract) infections: Secondary | ICD-10-CM | POA: Diagnosis not present

## 2024-06-15 DIAGNOSIS — E039 Hypothyroidism, unspecified: Secondary | ICD-10-CM

## 2024-06-15 DIAGNOSIS — F0280 Dementia in other diseases classified elsewhere without behavioral disturbance: Secondary | ICD-10-CM

## 2024-06-15 LAB — URINALYSIS, ROUTINE W REFLEX MICROSCOPIC
Bilirubin Urine: NEGATIVE
Hgb urine dipstick: NEGATIVE
Ketones, ur: NEGATIVE
Leukocytes,Ua: NEGATIVE
Nitrite: NEGATIVE
RBC / HPF: NONE SEEN
Specific Gravity, Urine: 1.02 (ref 1.000–1.030)
Total Protein, Urine: NEGATIVE
Urine Glucose: NEGATIVE
Urobilinogen, UA: 0.2 (ref 0.0–1.0)
WBC, UA: NONE SEEN
pH: 6 (ref 5.0–8.0)

## 2024-06-15 LAB — CBC WITH DIFFERENTIAL/PLATELET
Basophils Absolute: 0.1 K/uL (ref 0.0–0.1)
Basophils Relative: 1.1 % (ref 0.0–3.0)
Eosinophils Absolute: 0.4 K/uL (ref 0.0–0.7)
Eosinophils Relative: 5.1 % — ABNORMAL HIGH (ref 0.0–5.0)
HCT: 46.3 % — ABNORMAL HIGH (ref 36.0–46.0)
Hemoglobin: 15.4 g/dL — ABNORMAL HIGH (ref 12.0–15.0)
Lymphocytes Relative: 23.8 % (ref 12.0–46.0)
Lymphs Abs: 1.7 K/uL (ref 0.7–4.0)
MCHC: 33.3 g/dL (ref 30.0–36.0)
MCV: 91.6 fl (ref 78.0–100.0)
Monocytes Absolute: 0.6 K/uL (ref 0.1–1.0)
Monocytes Relative: 8.5 % (ref 3.0–12.0)
Neutro Abs: 4.3 K/uL (ref 1.4–7.7)
Neutrophils Relative %: 61.5 % (ref 43.0–77.0)
Platelets: 209 K/uL (ref 150.0–400.0)
RBC: 5.05 Mil/uL (ref 3.87–5.11)
RDW: 13.2 % (ref 11.5–15.5)
WBC: 7 K/uL (ref 4.0–10.5)

## 2024-06-15 LAB — TSH: TSH: 5.85 u[IU]/mL — ABNORMAL HIGH (ref 0.35–5.50)

## 2024-06-15 NOTE — Progress Notes (Signed)
 "  Established Patient Office Visit   Subjective:  Patient ID: [name], female    DOB: 26-Apr-1941  Age: 84 y.o. MRN: [id number]  Chief Complaint  Patient presents with   Hospitalization Follow-up    Patient went to the emergency department     HPI Encounter Diagnoses  Name Primary?   Acquired hypothyroidism Yes   History of UTI    Hospital discharge follow-up    Dementia due to Alzheimer's disease    Follow-up of above.  Recently seen in the ER this past month for fatigue.  Was diagnosed with a UTI treated with 5 days of Keflex  500 twice daily.  She is accompanied by her significant other Dempsey and her daughter [name].  Melissa is also my patient.  Patient suffers from dementia.  Dempsey says that she is on a 2-minute loop.  She does not remember anything past 2 minutes.  Has not been taking her medications regularly until recently.  Patient lives alone.  Dempsey and [place] check on her regularly.  Have recently started to monitor medication compliance.  Dempsey is paying her bills.   Review of Systems  Constitutional: Negative.   HENT: Negative.    Eyes:  Negative for blurred vision, discharge and redness.  Respiratory: Negative.    Cardiovascular: Negative.   Gastrointestinal:  Negative for abdominal pain.  Genitourinary: Negative.   Musculoskeletal: Negative.  Negative for myalgias.  Skin:  Negative for rash.  Neurological:  Negative for tingling, loss of consciousness and weakness.  Endo/Heme/Allergies:  Negative for polydipsia.    Current Medications[1]   Objective:     BP 116/70   Pulse 71   Temp 97.9 F (36.6 C)   Ht 5' 8 (1.727 m)   Wt 125 lb 9.6 oz (57 kg)   SpO2 99%   BMI 19.10 kg/m  Wt Readings from Last 3 Encounters:  06/15/24 125 lb 9.6 oz (57 kg)  03/30/24 130 lb 12.8 oz (59.3 kg)  01/31/24 134 lb (60.8 kg)      Physical Exam Constitutional:      General: She is not in acute distress.    Appearance: Normal appearance. She is  not ill-appearing, toxic-appearing or diaphoretic.  HENT:     Head: Normocephalic and atraumatic.     Right Ear: External ear normal.     Left Ear: External ear normal.  Eyes:     General: No scleral icterus.       Right eye: No discharge.        Left eye: No discharge.     Extraocular Movements: Extraocular movements intact.     Conjunctiva/sclera: Conjunctivae normal.     Pupils: Pupils are equal, round, and reactive to light.  Cardiovascular:     Rate and Rhythm: Normal rate and regular rhythm.  Pulmonary:     Effort: Pulmonary effort is normal. No respiratory distress.     Breath sounds: No wheezing or rales.  Abdominal:     Tenderness: There is no right CVA tenderness or left CVA tenderness.  Skin:    General: Skin is warm and dry.  Neurological:     Mental Status: She is alert. Mental status is at baseline.  Psychiatric:        Mood and Affect: Mood normal.        Behavior: Behavior normal.      Results for orders placed or performed in visit on 06/15/24  TSH  Result Value Ref Range   TSH 5.85 (  H) 0.35 - 5.50 uIU/mL  CBC with Differential/Platelet  Result Value Ref Range   WBC 7.0 4.0 - 10.5 K/uL   RBC 5.05 3.87 - 5.11 Mil/uL   Hemoglobin 15.4 (H) 12.0 - 15.0 g/dL   HCT 53.6 (H) 63.9 - 53.9 %   MCV 91.6 78.0 - 100.0 fl   MCHC 33.3 30.0 - 36.0 g/dL   RDW 86.7 88.4 - 84.4 %   Platelets 209.0 150.0 - 400.0 K/uL   Neutrophils Relative % 61.5 43.0 - 77.0 %   Lymphocytes Relative 23.8 12.0 - 46.0 %   Monocytes Relative 8.5 3.0 - 12.0 %   Eosinophils Relative 5.1 (H) 0.0 - 5.0 %   Basophils Relative 1.1 0.0 - 3.0 %   Neutro Abs 4.3 1.4 - 7.7 K/uL   Lymphs Abs 1.7 0.7 - 4.0 K/uL   Monocytes Absolute 0.6 0.1 - 1.0 K/uL   Eosinophils Absolute 0.4 0.0 - 0.7 K/uL   Basophils Absolute 0.1 0.0 - 0.1 K/uL      The ASCVD Risk score (Arnett DK, et al., 2019) failed to calculate for the following reasons:   The 2019 ASCVD risk score is only valid for ages 42 to 7   *  - Cholesterol units were assumed    Assessment & Plan:   Acquired hypothyroidism -     TSH  History of UTI -     CBC with Differential/Platelet -     Urinalysis, Routine w reflex microscopic  Hospital discharge follow-up  Dementia due to Alzheimer's disease    Return in about 8 weeks (around 08/10/2024).  Discussed the importance of adequate hydration.  Discussed the importance of compliance with levothyroxine  on a fasting stomach daily.  Family is starting to monitor her medication compliance more closely.  Discuss nutrition at next visit with noted weight loss.    Elsie Sim Lent, MD     [1]  Current Outpatient Medications:    Aspirin-Acetaminophen -Caffeine (EXCEDRIN MIGRAINE PO), Take 1 tablet by mouth daily as needed (headache)., Disp: , Rfl:    atorvastatin  (LIPITOR) 10 MG tablet, Take 1 tablet (10 mg total) by mouth daily., Disp: 90 tablet, Rfl: 3   donepezil  (ARICEPT ) 10 MG tablet, Take 1 tablet (10 mg total) by mouth at bedtime., Disp: 90 tablet, Rfl: 4   levothyroxine  (SYNTHROID ) 50 MCG tablet, Take 1 tablet (50 mcg total) by mouth daily., Disp: 90 tablet, Rfl: 0   memantine  (NAMENDA ) 10 MG tablet, TAKE 1 TABLET TWICE A DAY, Disp: 180 tablet, Rfl: 4   Multiple Vitamin (MULTIVITAMIN WITH MINERALS) TABS tablet, Take 1 tablet by mouth at bedtime., Disp: , Rfl:   "

## 2024-06-16 ENCOUNTER — Ambulatory Visit: Payer: Self-pay | Admitting: Family Medicine

## 2024-06-19 ENCOUNTER — Ambulatory Visit: Payer: Self-pay

## 2024-06-19 NOTE — Telephone Encounter (Signed)
 FYI Only or Action Required?: Action required by provider: update on patient condition and Please advise daughter if patient needs re-evaluated.  Still sleeping often after completing antibiotic course for UTI.  Patient was last seen in primary care on 06/15/2024 by Berneta Elsie Sayre, MD.  Called Nurse Triage reporting Fatigue.  Symptoms began several days ago.  Interventions attempted: Prescription medications: completed antibiotioc; patient is also agreeable to taking medications and keeping up with hydration, otherwise sleeps often.  Symptoms are: gradually improving.  Triage Disposition: Call PCP Within 24 Hours  Patient/caregiver understands and will follow disposition?: Yes    Copied from CRM #[id number]. Topic: Clinical - Medical Advice >> Jun 19, 2024  4:21 PM Chasity T wrote: Reason for CRM: Eleanor, daughter of patient has called with concerns of her mother sleep increasing. She states that her uti has cleared up but she is still sleeping a lot and unable to stay awoke for a while. She is wanting to know what she should do, if its apart of her mothers dementia or could it be something else? She is needing advise on how to handle her mothers dementia from here on. Please contact patient back to discuss.   Phone number: [phone]    Additional Information  Negative: [1] Urinary tract infection (e.g., cystitis, pyelonephritis, urethritis) AND [2] female AND [3] taking an antibiotic    Completed course.  Negative: [1] Finished taking antibiotics AND [2] symptoms are BETTER but [3] not completely gone    Still having fatigue, sleeping often, but also has dementia.  Will defer to PCP.  Answer Assessment - Initial Assessment Questions See alternative assessment.  Daughter inquires if ongoing fatigue is progression of dementia versus continuation of an acute problem (recent UTI).  Answer Assessment - Initial Assessment Questions INFECTION: What infection is the antibiotic  being given for?     UTI, presenting symptom prompting ED visit was sleeping all the time, patient had no other complaints or known symptoms per daughter.  Was found to have UTI in ED and started on antibiotics.  Has a history of dementia.  Daughter states there was slight improvement in coherence but patient has still said she is wiped out and has been sleeping often.  She is able to be awoken for medications, hydration, food and agreeable.   ANTIBIOTIC: What antibiotic are you taking How many times per day?     Completed course.    MAIN CONCERN OR SYMPTOM:  What is your main concern right now?     Still sleeping often, more than usual in past.   BETTER-SAME-WORSE: Are you getting better, staying the same, or getting worse compared to when you first started the antibiotics? If getting worse, ask: In what way?      Better- more coherent moments but still wiped out.   FEVER: Do you have a fever? If Yes, ask: What is your temperature, how was it measured, and when did it start?     No fever or chills.  SYMPTOMS: Are there any other symptoms you're concerned about? If Yes, ask: When did it start?     Only symptoms were/are fatigue/ sleeping. Little improvement but not much.  Daughter would like further advise, wonders if patient needs re-evaluated or if this is a part of dementia process.  Protocols used: Dementia Symptoms and Questions-A-AH, Infection on Antibiotic Follow-up Call-A-AH

## 2024-06-22 ENCOUNTER — Ambulatory Visit: Payer: Self-pay

## 2024-06-22 NOTE — Telephone Encounter (Signed)
 Daughter, eleanor, calling back that pt was not taken to ED this am because of road conditions and transportation. EMS not called for concerns of road conditions. Daughter thinks condition could be due to new UTI.  Advised based of pt presentation of symptoms to call EMS for transport to ED.  Daughter states she will relay info to her step father.

## 2024-06-22 NOTE — Telephone Encounter (Signed)
 FYI Only or Action Required?: FYI only for provider: ED advised.  Patient was last seen in primary care on 06/15/2024 by Berneta Elsie Sayre, MD.  Called Nurse Triage reporting Fatigue.  Symptoms began several weeks ago. worsening  Interventions attempted: Prescription medications: Keflex .  Symptoms are: gradually worsening.  Triage Disposition: Go to ED Now (or PCP Triage)  Patient/caregiver understands and will follow disposition?: Yes  Reason for Disposition  [1] Drinking very little AND [2] dehydration suspected (e.g., no urine > 12 hours, very dry mouth, very lightheaded)  Answer Assessment - Initial Assessment Questions Daughter calling for continued lethargy that is worsening. [name] was recently treated for UTI she remains lethargic. She never had complaints of urinary tract infection symptoms, she was found to have a UTI in the ER on 06/05/24 when she presented for fatigue-sleeping most of the day. She was treated with Keflex . Daughter reports it's more than fatigue, Family is needing to wake her up for meals and physically sit her up. Patient is denying all symptoms when asked and she is saying I don't feel good.  She is not drinking well, only voiding 1-2 times per day. Walking with shuffling gait which is her baseline. She was unable to sit up on her own this morning, daughter had to pull her to a sitting position and then when she wanted to lay back down she didn't lay down controlled she fell into laying position. History of Dementia. ED advised.   1. DESCRIPTION: Describe how you are feeling.     Extremely lethargic 2. SEVERITY: How bad is it?  Can you stand and walk?     Able to walk around 3. ONSET: When did these symptoms begin? (e.g., hours, days, weeks, months)     Several weeks 4. CAUSE: What do you think is causing the weakness or fatigue? (e.g., not drinking enough fluids, medical problem, trouble sleeping)     Unsure  5. NEW MEDICINES:  Have  you started on any new medicines recently? (e.g., opioid pain medicines, benzodiazepines, muscle relaxants, antidepressants, antihistamines, neuroleptics, beta blockers)     Keflex  06/05/24 6. OTHER SYMPTOMS: Do you have any other symptoms? (e.g., chest pain, fever, cough, SOB, vomiting, diarrhea, bleeding, other areas of pain)     Malaise  Protocols used: Weakness (Generalized) and Fatigue-A-AH Message from McBride F sent at 06/22/2024  9:08 AM EST  Summary: Extreme Fatigue   Reason for Triage: Patient was diagnosed with a UTI and was treated for it. Since then, the UTI symptoms have resolved, but the patient has been experiencing extreme fatigue, sleeping excessively and extremely lethargic while awake. Daughter, [name], is on the line.

## 2024-06-23 NOTE — Telephone Encounter (Signed)
 Noted. Dm/cma

## 2024-06-23 NOTE — Telephone Encounter (Signed)
 This issue has already been addressed in a different encounter. I am closing this one.

## 2024-07-06 ENCOUNTER — Ambulatory Visit: Admitting: Family Medicine

## 2024-08-17 ENCOUNTER — Ambulatory Visit: Admitting: Family Medicine

## 2024-08-24 ENCOUNTER — Ambulatory Visit: Admitting: Family Medicine

## 2024-08-28 ENCOUNTER — Ambulatory Visit: Admitting: Neurology

## 2024-09-05 ENCOUNTER — Ambulatory Visit: Admitting: Neurology

## 2024-11-21 ENCOUNTER — Ambulatory Visit: Admitting: Neurology

## 2025-03-02 ENCOUNTER — Ambulatory Visit
